# Patient Record
Sex: Female | Born: 1940 | Race: White | Hispanic: No | Smoking: Former smoker
Health system: Southern US, Community
[De-identification: ages and names within clinical notes are randomized; demographics above are authoritative.]

## PROBLEM LIST (undated history)

## (undated) DIAGNOSIS — E785 Hyperlipidemia, unspecified: Secondary | ICD-10-CM

## (undated) DIAGNOSIS — K469 Unspecified abdominal hernia without obstruction or gangrene: Secondary | ICD-10-CM

## (undated) DIAGNOSIS — I480 Paroxysmal atrial fibrillation: Secondary | ICD-10-CM

## (undated) DIAGNOSIS — I6523 Occlusion and stenosis of bilateral carotid arteries: Secondary | ICD-10-CM

## (undated) DIAGNOSIS — Z860100 Personal history of colon polyps, unspecified: Secondary | ICD-10-CM

## (undated) DIAGNOSIS — D039 Melanoma in situ, unspecified: Secondary | ICD-10-CM

## (undated) DIAGNOSIS — N281 Cyst of kidney, acquired: Secondary | ICD-10-CM

## (undated) DIAGNOSIS — H35033 Hypertensive retinopathy, bilateral: Secondary | ICD-10-CM

## (undated) DIAGNOSIS — I10 Essential (primary) hypertension: Secondary | ICD-10-CM

## (undated) DIAGNOSIS — K219 Gastro-esophageal reflux disease without esophagitis: Secondary | ICD-10-CM

## (undated) DIAGNOSIS — N6019 Diffuse cystic mastopathy of unspecified breast: Secondary | ICD-10-CM

## (undated) DIAGNOSIS — Z8619 Personal history of other infectious and parasitic diseases: Secondary | ICD-10-CM

## (undated) DIAGNOSIS — C4491 Basal cell carcinoma of skin, unspecified: Secondary | ICD-10-CM

## (undated) DIAGNOSIS — M1990 Unspecified osteoarthritis, unspecified site: Secondary | ICD-10-CM

## (undated) DIAGNOSIS — Z85820 Personal history of malignant melanoma of skin: Secondary | ICD-10-CM

## (undated) HISTORY — DX: Occlusion and stenosis of bilateral carotid arteries: I65.23

## (undated) HISTORY — DX: Cyst of kidney, acquired: N28.1

## (undated) HISTORY — PX: CARDIOVASCULAR STRESS TEST: SHX262

## (undated) HISTORY — DX: Hypertensive retinopathy, bilateral: H35.033

## (undated) HISTORY — DX: Personal history of other infectious and parasitic diseases: Z86.19

## (undated) HISTORY — DX: Essential (primary) hypertension: I10

## (undated) HISTORY — DX: Personal history of colon polyps, unspecified: Z86.0100

## (undated) HISTORY — DX: Melanoma in situ, unspecified: D03.9

## (undated) HISTORY — DX: Diffuse cystic mastopathy of unspecified breast: N60.19

## (undated) HISTORY — DX: Unspecified abdominal hernia without obstruction or gangrene: K46.9

## (undated) HISTORY — DX: Paroxysmal atrial fibrillation: I48.0

## (undated) HISTORY — DX: Personal history of malignant melanoma of skin: Z85.820

## (undated) HISTORY — DX: Basal cell carcinoma of skin, unspecified: C44.91

## (undated) HISTORY — DX: Gastro-esophageal reflux disease without esophagitis: K21.9

## (undated) HISTORY — DX: Hyperlipidemia, unspecified: E78.5

## (undated) HISTORY — DX: Unspecified osteoarthritis, unspecified site: M19.90

## (undated) HISTORY — PX: EXPLORATORY LAPAROTOMY: SUR591

## (undated) HISTORY — DX: Personal history of colonic polyps: Z86.010

---

## 1993-05-12 HISTORY — PX: APPENDECTOMY: SHX54

## 1995-05-13 HISTORY — PX: BREAST BIOPSY: SHX20

## 1996-05-12 HISTORY — PX: BREAST EXCISIONAL BIOPSY: SUR124

## 1996-05-12 HISTORY — PX: MELANOMA EXCISION: SHX5266

## 1997-02-09 HISTORY — PX: BREAST CYST ASPIRATION: SHX578

## 1998-05-12 LAB — HM DEXA SCAN: HM DEXA SCAN: NORMAL

## 2000-05-12 HISTORY — PX: CAROTID ENDARTERECTOMY: SUR193

## 2000-05-12 HISTORY — PX: CAROTID STENT: SHX1301

## 2003-05-13 DIAGNOSIS — K469 Unspecified abdominal hernia without obstruction or gangrene: Secondary | ICD-10-CM

## 2003-05-13 HISTORY — PX: TOTAL HIP ARTHROPLASTY: SHX124

## 2003-05-13 HISTORY — DX: Unspecified abdominal hernia without obstruction or gangrene: K46.9

## 2003-05-13 HISTORY — PX: HERNIA REPAIR: SHX51

## 2004-01-11 ENCOUNTER — Other Ambulatory Visit: Payer: Self-pay

## 2004-02-26 ENCOUNTER — Ambulatory Visit: Payer: Self-pay | Admitting: General Surgery

## 2004-08-20 ENCOUNTER — Ambulatory Visit: Payer: Self-pay | Admitting: Physician Assistant

## 2004-09-10 ENCOUNTER — Ambulatory Visit: Payer: Self-pay | Admitting: Pain Medicine

## 2004-09-26 ENCOUNTER — Ambulatory Visit: Payer: Self-pay | Admitting: Physician Assistant

## 2004-10-10 ENCOUNTER — Ambulatory Visit: Payer: Self-pay | Admitting: Pain Medicine

## 2004-10-22 ENCOUNTER — Ambulatory Visit: Payer: Self-pay | Admitting: Pain Medicine

## 2004-11-05 ENCOUNTER — Ambulatory Visit: Payer: Self-pay | Admitting: Physician Assistant

## 2005-01-30 ENCOUNTER — Ambulatory Visit: Payer: Self-pay | Admitting: Pain Medicine

## 2005-02-12 ENCOUNTER — Ambulatory Visit: Payer: Self-pay | Admitting: Pain Medicine

## 2005-02-14 ENCOUNTER — Ambulatory Visit: Payer: Self-pay | Admitting: Physician Assistant

## 2005-02-25 ENCOUNTER — Ambulatory Visit: Payer: Self-pay | Admitting: Pain Medicine

## 2005-03-11 ENCOUNTER — Ambulatory Visit: Payer: Self-pay | Admitting: General Surgery

## 2005-03-13 ENCOUNTER — Ambulatory Visit: Payer: Self-pay | Admitting: Pain Medicine

## 2005-03-26 ENCOUNTER — Ambulatory Visit: Payer: Self-pay | Admitting: Physician Assistant

## 2005-05-12 HISTORY — PX: OTHER SURGICAL HISTORY: SHX169

## 2006-03-12 ENCOUNTER — Ambulatory Visit: Payer: Self-pay | Admitting: General Surgery

## 2006-10-16 ENCOUNTER — Encounter: Admission: RE | Admit: 2006-10-16 | Discharge: 2006-10-16 | Payer: Self-pay | Admitting: Orthopedic Surgery

## 2006-12-08 ENCOUNTER — Inpatient Hospital Stay (HOSPITAL_COMMUNITY): Admission: RE | Admit: 2006-12-08 | Discharge: 2006-12-11 | Payer: Self-pay | Admitting: Orthopedic Surgery

## 2007-03-16 ENCOUNTER — Ambulatory Visit: Payer: Self-pay | Admitting: General Surgery

## 2007-06-02 ENCOUNTER — Encounter: Admission: RE | Admit: 2007-06-02 | Discharge: 2007-06-02 | Payer: Self-pay | Admitting: Orthopedic Surgery

## 2007-08-30 ENCOUNTER — Inpatient Hospital Stay (HOSPITAL_COMMUNITY): Admission: RE | Admit: 2007-08-30 | Discharge: 2007-09-02 | Payer: Self-pay | Admitting: Orthopedic Surgery

## 2008-03-16 ENCOUNTER — Ambulatory Visit: Payer: Self-pay | Admitting: General Surgery

## 2009-03-19 ENCOUNTER — Ambulatory Visit: Payer: Self-pay | Admitting: General Surgery

## 2010-03-20 ENCOUNTER — Ambulatory Visit: Payer: Self-pay | Admitting: General Surgery

## 2010-09-24 NOTE — Op Note (Signed)
NAMEAMYA, HLAD                ACCOUNT NO.:  [id number]   MEDICAL RECORD NO.:  [id number]          PATIENT TYPE:  INP   LOCATION:  0011                         FACILITY:  [facility]   PHYSICIAN:  Ollen Gross, M.D.    DATE OF BIRTH:  1940-06-12   DATE OF PROCEDURE:  12/08/2006  DATE OF DISCHARGE:                               OPERATIVE REPORT   PREOPERATIVE DIAGNOSIS:  Osteoarthritis left hip.   POSTOPERATIVE DIAGNOSIS:  Osteoarthritis left hip.   PROCEDURE:  Less total hip arthroplasty.   SURGEON:  Ollen Gross, M.D.   ASSISTANT:  Avel Peace, PA-C   ANESTHESIA:  General.   ESTIMATED BLOOD LOSS:  200 mL.   DRAIN:  None.   COMPLICATIONS:  None.   CONDITION:  Stable to recovery room.   BRIEF CLINICAL NOTE:  [name] is a 70 year old female end-stage  arthritis of left hip with progressively worsening pain and dysfunction.  She has failed nonoperative management and presents for left total hip  arthroplasty.   PROCEDURE IN DETAIL:  The successful administration of general  anesthetic, the patient was placed the right lateral decubitus position  with the left side up and held with the hip positioner.  Left lower  extremity isolated from the perineum with plastic drapes and prepped and  draped in usual sterile fashion.  Short posterolateral incision was made  with a 10 blade through subcutaneous tissue to the level of fascia lata  was incised in line with the skin incision.  Sciatic nerve is palpated  and protected and the short rotators isolated off the femur.  Capsulectomy is performed and the hip was dislocated.  Center of femoral  head is marked and the trial prosthesis placed such that the center of  the trial head corresponds to center of his native femoral head.  Osteotomy lines marked on the femoral neck and osteotomy made with an  oscillating saw.  Femoral head is removed and the femur retracted  anteriorly to gain acetabular exposure.   Acetabular  retractors were placed and the labrum and osteophytes  removed.  She had a large rim of circumferential osteophyte which I took  out. Reaming starts at 43 mm coursing increments of 2 up to 49 mm and  then a 50 mm Pinnacle acetabular shell was placed in anatomic position  and transfixed with two dome screws.  The apex hole eliminator is placed  and a 36 mm neutral Ultamet metal shell and liner was placed.   The femur was prepared with the canal finder and irrigation.  Axial  reaming is performed to 13.5 mm, proximal reaming to 18B and the sleeve  machined to a small.  18B small trial sleeve is placed on 18 x 13 stem  and 36 plus 8 neck matching native anteversion.  A 36 plus 0 head is  placed and the hip is reduced with outstanding stability.  There is full  extension, full external rotation, 70 degrees flexion, 40 degrees  abduction and 90 degrees internal rotation and 90 degrees of flexion and  70 degrees of internal rotation.  By placing the  left leg on top of the  right I felt as though leg lengths were equal.   The hip is dislocated and all trials are removed.  The permanent 18B  small sleeve is placed with 18 x 13 stem and 36 plus 8 neck matching  native anteversion.  A 36 plus 0 head is placed and the hip is reduced  with same stability parameters.  Wound is copiously irrigated with  saline solution and the short rotators reattached to the femur through  drill holes.  Fascia lata was closed with interrupted #1 Vicryl.  Subcu  closed #1-0 and #2-0 Vicryl and subcuticular running 4-0 Monocryl.  Incisions cleaned and dried and Steri-Strips and bulky sterile dressing  applied.  She is placed into a knee immobilizer, awakened and  transferred to recovery in stable condition.      Ollen Gross, M.D.  Electronically Signed     FA/MEDQ  D:  12/08/2006  T:  12/09/2006  Job:  [id number]

## 2010-09-24 NOTE — H&P (Signed)
[name], [name]                ACCOUNT NO.:  [id number]   MEDICAL RECORD NO.:  [id number]          PATIENT TYPE:  INP   LOCATION:  1613                         FACILITY:  [facility]   PHYSICIAN:  Ollen Gross, M.D.    DATE OF BIRTH:  1940-07-02   DATE OF ADMISSION:  12/08/2006  DATE OF DISCHARGE:                              HISTORY & PHYSICAL   DATE OF OFFICE VISIT HISTORY AND PHYSICAL:  November 27, 2006.   CHIEF COMPLAINT:  Left hip pain.   HISTORY OF PRESENT ILLNESS:  This is a 70 year old female who is seen by  Dr. Lequita Halt for ongoing left hip pain that has been increasing over the  past several months, been giving her problems for quite some time now.  Pain has gotten worse to the point where it is hurting all the time.  She is seen in the office, where x-rays show severe end-stage arthritis  of both hips, bone-on-bone, the left is a little worse than the right.  It is felt she has reached the point where she would benefit from  undergoing surgical intervention.  Risks and benefits discussed, the  patient has accepted and is admitted to the hospital.   ALLERGIES:  No known drug allergies.  Intolerances:  Erythromycin.   CURRENT MEDICATIONS:  Plavix, enalapril, Celebrex, Toprol, Lipitor,  hydrochlorothiazide, vitamin C, multivitamins, Os-Cal Plus D, Tylenol  Extra Strength, Tylenol Arthritis, and Claritin.   PAST MEDICAL HISTORY:  1. Tic douloureux.  2. 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  3. History of depression.  4. Migraines.  5. Irritable bowel syndrome.  6. Seasonal allergies.  7. Left hip dysplasia.  8. Right carotid disease.   PAST SURGICAL HISTORY:  1. Right carotid stenting.  2. Tonsillectomy.  3. Surgery x2 for tic douloureux.   SOCIAL HISTORY:  Nonsmoker, one glass of wine.  Two children.  Married.  Husband will be assisting with care after surgery.   FAMILY HISTORY:  Father deceased at age 64 secondary to an MI.  Mother,  age 27, relative good  health.   REVIEW OF SYSTEMS:  GENERAL:  No fevers, chills, night sweats.  NEURO:  No seizures, syncope or paralysis.  RESPIRATORY:  No shortness of  breath, productive cough or hemoptysis.  CARDIOVASCULAR: No chest pain,  angina or orthopnea.  GI: No nausea, vomiting, diarrhea or constipation.  GU: No dysuria, hematuria or discharge.  MUSCULOSKELETAL:  Left hip.   PHYSICAL EXAM:  VITAL SIGNS:  Pulse 56, respirations 12, blood pressure  118/62.  GENERAL:  A 70 year old white female, well-nourished, well-developed,  short stature, no acute distress.  She is alert and oriented and  cooperative, excellent historian, accompanied by her husband.  HEENT: Normocephalic, atraumatic.  Pupils are equal, round and reactive.  Oropharynx clear.  EOMs intact.  NECK:  Supple.  Bilateral carotid scars noted.  She does have bilateral  bruits, trace to +1.  CHEST:  Clear anterior, posterior chest walls.  No rhonchi, rales, or  wheezing.  HEART:  Regular rate and rhythm.  No murmur, S1-S2 noted.  ABDOMEN:  Soft, nontender.  Bowel  sounds present.  RECTAL, BREASTS, GENITALIA:  Not done, not pertinent to present illness.  EXTREMITIES:  Left hip:  Left hip flexion 90-0, internal rotation 0,  external rotation 0 degrees of abduction.  Pulses noted to be intact.   IMPRESSION:  Osteoarthritis, left hip.   PLAN:  The patient will be admitted to [facility] to undergo a  left total hip replacement arthroplasty.  Surgery will be performed by  Dr. Ollen Gross.      Alexzandrew L. Perkins, P.A.C.      Ollen Gross, M.D.  Electronically Signed    ALP/MEDQ  D:  12/09/2006  T:  12/10/2006  Job:  [id number]   cc:   Lorie Phenix  Fax: [phone]

## 2010-09-24 NOTE — Op Note (Signed)
NAMEKHRISTA, [name]                ACCOUNT NO.:  [id number]   MEDICAL RECORD NO.:  [id number]          PATIENT TYPE:  INP   LOCATION:  0004                         FACILITY:  [facility]   PHYSICIAN:  Ollen Gross, M.D.    DATE OF BIRTH:  Dec 19, 1940   DATE OF PROCEDURE:  08/30/2007  DATE OF DISCHARGE:                               OPERATIVE REPORT   PREOPERATIVE DIAGNOSIS:  Osteoarthritis, right hip.   POSTOPERATIVE DIAGNOSIS:  Osteoarthritis, right hip.   PROCEDURE:  Right total hip arthroplasty.   SURGEON:  Ollen Gross, M.D.   ASSISTANT:  Avel Peace, PA-C   ANESTHESIA:  General.   ESTIMATED BLOOD LOSS:  300.   DRAIN:  Hemovac x1.   COMPLICATIONS:  None.   CONDITION:  Stable, to Recovery.   BRIEF CLINICAL NOTE:  [name] is a 70 year old female with end-stage  arthritis of the right hip and progressively worsening pain and  dysfunction.  She has had a recent successful left total hip  arthroplasty and presents now for right total hip arthroplasty.   PROCEDURE IN DETAIL:  After successful administration of general  anesthetic, the patient is placed in the left lateral decubitus position  with the right side up and held with the hip positioner.  The right  lower extremity is isolated from the perineum with plastic drapes and  prepped and draped in the usual sterile fashion.  A short posterolateral  incision is made with a 10 blade through subcutaneous tissue to the  level of fascia lata, which is incised in line with the skin incision.  The sciatic nerve is palpated and protected and the short external  rotators isolated off the femur.  Capsulectomy is performed and the hip  is dislocated.  Center of the femoral head is marked and a trial  prosthesis placed such that the center of the trial head corresponds to  center of her native femoral head.  Osteotomy lines are marked on the  femoral neck and osteotomy made with an oscillating saw.  Femoral head  is removed and  the femur retracted anteriorly to gain acetabular  exposure.   Acetabular retractors are placed and labrum and osteophytes removed.  Reaming starts at 43 mm, coursing in increments of 2-49 mm and then a 50-  mm Pinnacle acetabular shell is placed in anatomic position and  transfixed with 2 dome screws.  Apex hole eliminator is placed and 36-mm  neutral Ultamet metal liner is placed for a metal-on-metal hip  replacement.   Femur is prepared with the canal finder and irrigation.  Axial reaming  is performed up to 13.5 mm and proximal reaming to 18-D and the sleeve  machined to a small.  An 18-D small trial sleeve is placed with 18 x 13  stem and a 36 +8 neck, matching native anteversion.  A 36 +0 head is  placed and the hip is reduced with outstanding stability.  There is full  extension, full external rotation, 70 degrees of flexion, 40 degrees of  adduction, 90 degrees of internal rotation and 90 degrees of flexion and  90 degrees  of internal rotation.  By placing the right leg on top of the  left, it felt as though the leg lengths were equal.  The hip is  dislocated and trials removed.  Permanent 18-D small sleeve is placed  and 18 x 13 stem and 36 +8 neck, matching native anteversion.  The 36 +0  head is placed and the hip is reduce with the same stability parameters.  The wound is copiously irrigated with saline solution and the short  rotators reattached to the femur through drill holes.  Gross subcu is  closed with #1 and 2-0 Vicryl and subcuticular with running 4-0  Monocryl.  The incision is cleaned and dried and Steri-Strips and a  bulky sterile dressing applied.  A drain is hooked to suction and she is  placed into a knee immobilizer, awakened and transported to Recovery in  stable condition.      Ollen Gross, M.D.  Electronically Signed     FA/MEDQ  D:  08/30/2007  T:  08/30/2007  Job:  [id number]

## 2010-09-27 NOTE — Discharge Summary (Signed)
**Note Rebecca via Obfuscation** Mcgee, [name]                ACCOUNT NO.:  [id number]   MEDICAL RECORD NO.:  [id number]          PATIENT TYPE:  INP   LOCATION:  1613                         FACILITY:  [facility]   PHYSICIAN:  Ollen Gross, M.D.    DATE OF BIRTH:  08/24/1940   DATE OF ADMISSION:  12/08/2006  DATE OF DISCHARGE:  12/11/2006                               DISCHARGE SUMMARY   ADMITTING DIAGNOSES:  1. Osteoarthritis left hip.  2. Tic douloureux.  3. Decreased hearing right ear.  4. History of depression.  5. Migraines.  6. Irritable bowel syndrome.  7. Seasonal allergies.  8. Left hip dysplasia.  9. Right carotid disease.   DISCHARGE DIAGNOSES:  1. Osteoarthritis left hip status post left total hip replacement      arthroplasty.  2. Postop blood loss anemia.  Did not require transfusion.  3. Postop hyponatremia improved.  4. Decreased hearing right ear.  5. History of depression.  6. Migraines.  7. Irritable bowel syndrome.  8. Seasonal allergies.  9. Left hip dysplasia.  10.Right carotid disease.   PROCEDURE:  December 08, 2006 left total hip.  Surgeon Dr. Lequita Halt.  Assisting Alexzandrew Perkins P.A.C.   ANESTHESIA:  General.   CONSULTATIONS:  None.   BRIEF HISTORY:  The patient is a 70 year old female with end-stage  arthritis of left hip with progressive worsening pain and dysfunction  who failed operative management now presents for total hip arthroplasty.   EKG dated November 06, 2006:  Sinus rhythm, normal P axis, heart rate 84  confirmed.   LABORATORY DATA:  CBC on admission:  Hemoglobin 14.6, hematocrit 42.4,  white cell count 4.8, hemoglobin dropped down to 11.4 and drifted down  to 10.1, last H&H 9.7, 27.6.  PT/PTT preop 11.9 and 29, respectively.  INR 0.9.  Serial protimes  followed.  PT/INR 19.6 and 1.6.  Chem panel  on admission all within normal limits.  Serial BMET's were followed.  Sodium did drop from 135 to 131 and back to 135.  Preop UA:  Trace  leukocyte esterase, 0-2  white, 0-2 red; otherwise, negative.  Blood  group type A+.   X-RAYS:  Left hip films December 02, 2006:  Severe bilateral hip arthritis  left greater than right.  Two-view chest December 02, 2006:  No acute  findings.  Postop hip and pelvis films:  Left total hip without  complication.   HOSPITAL COURSE:  The patient admitted to [facility].  Tolerated the procedure well.  Later transferred from recovery room to  orthopedic floor.  Started on PCA and p.o. analgesic pain control  following surgery.  Actually doing pretty well on the morning of day #1.  Sitting up in bed.  I could already tell a big difference in her pain  from preop.  Started on PCA and p.o. analgesics.  Given 24 hours postop  IV antibiotics.  Started on Coumadin for DVT prophylaxis.  We decreased  her fluids.  Started getting her up out of bed on day #1.  By day #2 she  was doing very well.  Already ambulating about 60-70  feet.  Dressing was  changed, and incision looked good.  Hemoglobin was 10 without symptoms.  Sodium was a little low on day #1, but came back up on day #2 after we  discontinued the fluids.  Continued to progress well and by day #3 she  was doing well, ambulating 60-70 feet.  Hemoglobin was 9.7.  She was  asymptomatic with this.  She is placed on iron.  Progressing and meeting  her goals and was discharged home.   DISCHARGE/PLAN:  1. The patient discharged home on December 11, 2006.  2. Discharge Diagnoses, please see above.  3. Discharge medications:  Nu-Iron, Coumadin, Percocet, Robaxin.      Follow up two weeks.   ACTIVITY:  Partial weightbearing 25-50%.   DIET:  Resume home diet.   DISPOSITION:  Home.   CONDITION ON DISCHARGE:  Improved.      Alexzandrew L. Perkins, P.A.C.      Ollen Gross, M.D.  Electronically Signed    ALP/MEDQ  D:  01/14/2007  T:  01/14/2007  Job:  [id number]   cc:   Lorie Phenix  Fax: [phone]

## 2010-09-27 NOTE — H&P (Signed)
[name], [name]                ACCOUNT NO.:  [id number]   MEDICAL RECORD NO.:  [id number]          PATIENT TYPE:  INP   LOCATION:  1610                         FACILITY:  [facility]   PHYSICIAN:  Ollen Gross, M.D.    DATE OF BIRTH:  [birth year]   DATE OF ADMISSION:  08/30/2007  DATE OF DISCHARGE:                              HISTORY & PHYSICAL   CHIEF COMPLAINT:  Right hip pain.   HISTORY OF PRESENT ILLNESS:  The patient is a 70 year old female well-  known to Dr. Ollen Gross who had previously undergone a left total hip  back in July of 2008.  Was doing well with that.  She continues to have  right hip pain.  Has known arthritis and felt to be a good candidate.  She has been seen preoperatively by Dr. Elease Hashimoto and also by Dr. Marisue Brooklyn and cleared for surgery.   ALLERGIES:  NO KNOWN DRUG ALLERGIES.   CURRENT MEDICATIONS:  Enalapril, Lipitor, Plavix, Celebrex,  hydrochlorothiazide, Toprol-XL, Claritin, multivitamin, Os-Cal plus D,  vitamin C, Extra-strength Tylenol.   PAST MEDICAL HISTORY:  1. Tic douloureux.  2. History of melanoma.  3. History of migraines.  4. History of depression.  5. Decreased hearing, right ear.  6. Hypertension.  7. Right carotid arterial disease.  8. Chronic incomplete right bundle branch block.  9. IBS.  10.Hemorrhoids.  11.Postmenopausal.   SOCIAL HISTORY:  Married, nonsmoker, occasional glass of wine.  Two  children.  Husband will be assisting with care after surgery.   FAMILY HISTORY:  Hypertension, arthritis.   REVIEW OF SYSTEMS:  GENERAL:  No fevers, chills, night sweats.  NEURO:  No seizures, syncope, or paralysis.  RESPIRATORY:  No shortness breath,  productive cough, or hemoptysis.  CV:  No chest pain, angina, or  orthopnea.  GI:  No nausea, vomiting, diarrhea, or constipation.  Does  have IBS and hemorrhoids.  GU:  No dysuria, hematuria, or discharge.  MUSCULOSKELETAL:  Right hip.   PHYSICAL EXAMINATION:  VITAL SIGNS:  Pulse  60, respirations 12, blood  pressure 112/60.  GENERAL:  A 70 year old female, well-nourished, well-developed, no acute  distress.  She is alert, oriented, cooperative, pleasant.  Good  historian.  HEENT:  Normocephalic, atraumatic.  Pupils round and reactive.  Oropharynx clear.  EOMs intact.  NECK:  Supple with a trace right bruit.  CHEST:  Clear.  HEART:  Regular rate and rhythm with faint systolic ejection murmur best  heard over aortic point.  S1-S2 noted.  ABDOMEN:  Soft, nontender.  Bowel sounds present.  RECTAL/BREASTS, GENITALIA:  Not done, not pertinent to present illness.  EXTREMITIES:  Right hip, flexion 90-0, internal rotation 0, external  rotation 10-15 degrees abduction.   IMPRESSION:  Osteoarthritis right hip.   PLAN:  The patient admitted to [facility] undergo a right  total hip arthroplasty.  Surgery will be performed by Ollen Gross.      Alexzandrew L. Perkins, P.A.C.      Ollen Gross, M.D.  Electronically Signed    ALP/MEDQ  D:  09/12/2007  T:  09/12/2007  Job:  [id number]   cc:   [name]  Fax: [phone]   Lovenia Kim, D.O.  Fax: [phone]   Ollen Gross, M.D.  Fax: [phone]

## 2010-09-27 NOTE — Discharge Summary (Signed)
[name], [name]                ACCOUNT NO.:  [id number]   MEDICAL RECORD NO.:  [id number]          PATIENT TYPE:  INP   LOCATION:  1610                         FACILITY:  [facility]   PHYSICIAN:  Ollen Gross, M.D.    DATE OF BIRTH:  January 11, 1941   DATE OF ADMISSION:  08/30/2007  DATE OF DISCHARGE:  09/02/2007                               DISCHARGE SUMMARY   ADMITTING DIAGNOSIS:  1. Osteoarthritis, right hip.  2. Tic douloureux.  3. History of melanoma.  4. History of migraines.  5. History of depression.  6. Decreased hearing, right ear.  7. Hypertension.  8. Right carotid arterial disease.  9. Chronic incomplete right bundle branch block.  10.Irritable bowel syndrome.  11.Hemorrhoids.  12.Postmenopausal.   DISCHARGE DIAGNOSES:  1. Osteoarthritis, right hip, status post right total hip      arthroplasty.  2. Postoperative blood loss anemia.  Did not require transfusion.  3. Postoperative hyponatremia, improved.  4. Tic douloureux.  5. History of melanoma.  6. History of migraines.  7. History of depression.  8. Decreased hearing, right ear.  9. Hypertension.  10.Right carotid arterial disease.  11.Chronic incomplete right bundle branch block.  12.Irritable bowel syndrome.  13.Hemorrhoids.  14.Postmenopausal.   PROCEDURE:  On August 30, 2007, right total hip.  Surgeon:  Ollen Gross, M.D.  Assistant:  Alexzandrew L. Perkins, P.A.C.  Anesthesia:  General.   CONSULTATIONS:  None.   BRIEF HISTORY:  Ms. Base is a 70 year old female with end-stage  osteoarthritis of the right hip, progressively worsening pain and  dysfunction.  She has had a recent successful left total hip, failed  nonoperative management, now presents for right total hip.   LABORATORY DATA:  Preoperative CBC showed hemoglobin of 15.5, hematocrit  of 45.6, white cell count 5.8.  Postoperative hemoglobin 9.9, drifted  down to 9.2.  Rechecked, H and H came back to 9.6 and 27.5.  PT and PTT  12.3 and  31, respectively, INR 0.9.  Serial pro-times followed, last  PT/INR 20.5 and 1.7.  Chem panel on admission all within normal limits.  Serial BMETs were followed; sodium dropped from 138 to 131, back up to  136.  Glucose went from 90 to 134, back down to 108.  Preoperative UA  negative.  Blood type A-positive.   X-RAYS:  Right hip film preoperatively, August 25, 2007:  Advanced  arthritis, right hip.  Two-view chest, August 25, 2007:  No active disease.   EKG, dated July 07, 2007:  Sinus rhythm, normal P axis, rate 83,  borderline.   HOSPITAL COURSE:  The patient was admitted to [facility],  tolerated the procedure well, transferred to recovery room, orthopedic  floor, started on PCA and p.o. analgesics and pain control following  surgery.  Doing pretty well the morning of day #1.  Started getting out  of bed.  The PCA was discontinued later that day.  Pressure was a little  on the lower side, so we held her enalapril with parameters.  Sodium was  a little low also.  This was felt to be due  to dilutional components, we  decreased the fluids.  She had excellent urinary output.  Hemovac drain  placed at time of surgery was pulled.  She started getting up out of  bed.  She initially walked about 20 feet on day #1.  By day #2, she was  doing a little bit better, ambulating well, walking 60 feet.  Dressing  changed.  Incision looked good.  Hemoglobin down to 9.2.  She was on  iron supplementation.  She was asymptomatic.  Hypotension with systolics  about 94.  She was asymptomatic with this with her therapy.  Incision  looked good, healing well, walked about 100 feet on the following day,  postoperative day #3.  Pressure was back up to 114 systolic.  Tolerating  her medicine.  Was discharged home.   DISCHARGE PLAN:  Patient discharged home on September 02, 2007.   DISCHARGE DIAGNOSES:  Please see above.   DISCHARGE MEDICATIONS:  Percocet, Robaxin, __________ and Coumadin.    DIET:  Low-sodium heart-healthy diet.   ACTIVITY:  Partial weightbearing, 25%, right leg.  Hip precautions,  total hip protocol.   FOLLOWUP:  Two weeks.   DISPOSITION:  Home.   CONDITION ON DISCHARGE:  Improving.      Alexzandrew L. Perkins, P.A.C.      Ollen Gross, M.D.  Electronically Signed    ALP/MEDQ  D:  10/13/2007  T:  10/13/2007  Job:  [id number]   cc:   Lorie Phenix  Fax: [phone]   Lovenia Kim, D.O.  Fax: [phone]

## 2011-02-04 LAB — BASIC METABOLIC PANEL
BUN: 8
BUN: 8
CO2: 26
CO2: 31
Calcium: 8.1 — ABNORMAL LOW
Chloride: 100
Chloride: 102
Creatinine, Ser: 0.77
GFR calc non Af Amer: >60
Glucose, Bld: 108 — ABNORMAL HIGH
Glucose, Bld: 134 — ABNORMAL HIGH
Potassium: 4.2
Potassium: 4.3
Sodium: 131 — ABNORMAL LOW
Sodium: 136

## 2011-02-04 LAB — CBC
HCT: 27.3 — ABNORMAL LOW
HCT: 27.5 — ABNORMAL LOW
HCT: 28.5 — ABNORMAL LOW
HCT: 45.6
Hemoglobin: 15.5 — ABNORMAL HIGH
Hemoglobin: 9.2 — ABNORMAL LOW
Hemoglobin: 9.6 — ABNORMAL LOW
Hemoglobin: 9.9 — ABNORMAL LOW
MCHC: 33.6
MCHC: 34.1
MCHC: 34.5
MCV: 94.9
MCV: 95.1
MCV: 95.3
MCV: 96.6
Platelets: 196
Platelets: 196
Platelets: 250
RBC: 2.83 — ABNORMAL LOW
RBC: 3.01 — ABNORMAL LOW
RBC: 4.79
RDW: 13
RDW: 13
RDW: 13.3
RDW: 13.3
WBC: 5.8
WBC: 7.8

## 2011-02-04 LAB — TYPE AND SCREEN
ABO/RH(D): A POS
Antibody Screen: NEGATIVE

## 2011-02-04 LAB — URINALYSIS, ROUTINE W REFLEX MICROSCOPIC
Bilirubin Urine: NEGATIVE
Glucose, UA: NEGATIVE
Hgb urine dipstick: NEGATIVE
Ketones, ur: NEGATIVE
Nitrite: NEGATIVE
Protein, ur: NEGATIVE
Specific Gravity, Urine: 1.019
Urobilinogen, UA: 0.2
pH: 7.5

## 2011-02-04 LAB — COMPREHENSIVE METABOLIC PANEL
AST: 20
Albumin: 4.9
Alkaline Phosphatase: 88
BUN: 15
Chloride: 97
Potassium: 4.8
Sodium: 138
Total Bilirubin: 1
Total Protein: 8.3

## 2011-02-04 LAB — PROTIME-INR
INR: 0.9
Prothrombin Time: 12.3

## 2011-02-04 LAB — APTT: aPTT: 31

## 2011-02-24 LAB — PROTIME-INR
Prothrombin Time: 19.6 — ABNORMAL HIGH
Prothrombin Time: 11.9

## 2011-02-24 LAB — CBC
HCT: 27.6 — ABNORMAL LOW
HCT: 28.6 — ABNORMAL LOW
HCT: 32.4 — ABNORMAL LOW
Hemoglobin: 10.1 — ABNORMAL LOW
Hemoglobin: 11.4 — ABNORMAL LOW
Hemoglobin: 14.6
MCHC: 35.4
MCHC: 35.3
MCV: 98.4
MCV: 99.3
Platelets: 212
RBC: 2.8 — ABNORMAL LOW
RBC: 2.88 — ABNORMAL LOW
RBC: 3.29 — ABNORMAL LOW
RBC: 4.32
RDW: 13.6
WBC: 7.4

## 2011-02-24 LAB — COMPREHENSIVE METABOLIC PANEL
ALT: 17
Alkaline Phosphatase: 61
CO2: 29
Chloride: 96
GFR calc non Af Amer: >60
Glucose, Bld: 107 — ABNORMAL HIGH
Potassium: 4.3
Sodium: 135

## 2011-02-24 LAB — BASIC METABOLIC PANEL
CO2: 29
Chloride: 99
Creatinine, Ser: 0.66
GFR calc Af Amer: >60
GFR calc Af Amer: >60
GFR calc non Af Amer: >60
Glucose, Bld: 155 — ABNORMAL HIGH
Potassium: 4.3
Potassium: 4.4
Sodium: 131 — ABNORMAL LOW
Sodium: 135

## 2011-02-24 LAB — CROSSMATCH

## 2011-02-24 LAB — URINALYSIS, ROUTINE W REFLEX MICROSCOPIC
Bilirubin Urine: NEGATIVE
Hgb urine dipstick: NEGATIVE
Specific Gravity, Urine: 1.02
Urobilinogen, UA: 0.2
pH: 7

## 2011-02-24 LAB — URINE MICROSCOPIC-ADD ON

## 2011-03-28 LAB — COMPREHENSIVE METABOLIC PANEL
AST: 15 U/L
Alkaline Phosphatase: 98 U/L
BUN: 19 mg/dL (ref 4–21)
Creat: 1.09
Sodium: 140 mmol/L (ref 137–147)
Total Bilirubin: 0.5 mg/dL

## 2011-04-23 ENCOUNTER — Ambulatory Visit: Payer: Self-pay | Admitting: General Surgery

## 2011-05-16 ENCOUNTER — Ambulatory Visit: Payer: Self-pay | Admitting: General Surgery

## 2011-05-27 ENCOUNTER — Ambulatory Visit: Payer: Self-pay | Admitting: General Surgery

## 2011-06-26 ENCOUNTER — Ambulatory Visit: Payer: Self-pay | Admitting: General Surgery

## 2011-06-26 LAB — POTASSIUM: Potassium: 3.9 mmol/L (ref 3.5–5.1)

## 2011-07-01 ENCOUNTER — Ambulatory Visit: Payer: Self-pay | Admitting: General Surgery

## 2012-03-03 ENCOUNTER — Ambulatory Visit: Payer: Self-pay | Admitting: General Surgery

## 2012-03-12 HISTORY — PX: COLONOSCOPY: SHX174

## 2012-04-23 ENCOUNTER — Ambulatory Visit: Payer: Self-pay | Admitting: General Surgery

## 2012-05-12 HISTORY — PX: CAROTID STENT: SHX1301

## 2012-05-14 ENCOUNTER — Encounter: Payer: Self-pay | Admitting: Family Medicine

## 2012-06-15 ENCOUNTER — Ambulatory Visit (INDEPENDENT_AMBULATORY_CARE_PROVIDER_SITE_OTHER): Payer: Medicare Other | Admitting: Family Medicine

## 2012-06-15 ENCOUNTER — Encounter: Payer: Self-pay | Admitting: Family Medicine

## 2012-06-15 VITALS — BP 108/64 | HR 68 | Temp 98.2°F | Ht 62.5 in | Wt 159.8 lb

## 2012-06-15 DIAGNOSIS — I10 Essential (primary) hypertension: Secondary | ICD-10-CM | POA: Insufficient documentation

## 2012-06-15 DIAGNOSIS — I658 Occlusion and stenosis of other precerebral arteries: Secondary | ICD-10-CM

## 2012-06-15 DIAGNOSIS — M1990 Unspecified osteoarthritis, unspecified site: Secondary | ICD-10-CM

## 2012-06-15 DIAGNOSIS — I6523 Occlusion and stenosis of bilateral carotid arteries: Secondary | ICD-10-CM

## 2012-06-15 DIAGNOSIS — E785 Hyperlipidemia, unspecified: Secondary | ICD-10-CM

## 2012-06-15 DIAGNOSIS — M129 Arthropathy, unspecified: Secondary | ICD-10-CM

## 2012-06-15 NOTE — Assessment & Plan Note (Signed)
Chronic, stable. Compliant and tolerant of med - continue. Check FLP next blood work.

## 2012-06-15 NOTE — Assessment & Plan Note (Signed)
S/p R CEA by Dr. Jodie Echevaria at [facility]. To schedule f/u with him.

## 2012-06-15 NOTE — Patient Instructions (Addendum)
Check with Dr. Evette Cristal about latest tetanus shot. Good to meet you today, call us with questions We will obtain records of recent blood work and from Dr. Elease Hashimoto. Return at your convenience fasting for blood work, afterwards for [employer] visit.

## 2012-06-15 NOTE — Progress Notes (Signed)
Subjective:    Patient ID: [name], female    DOB: [birth year], 72 y.o.   MRN: [id number]  HPI CC: new pt to establish  Prior saw Dr. Elease Hashimoto in [place].  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 Roseanne Kaufman at [facility])  HTN - longstanding hx.  Tolerating bp meds well.  See ROS. HLD - no myalgias.  Tolerating lipitor well. Carotid stenosis - s/p R CEA, doing well  Preventative: Thinks last medicare wellness visit 1 yr ago Colonoscopy 03/2012 (Sankar) Pneumovax - 7-8 yrs ago.  Completed Mammogram 04/2012 normal Well woman with OBGYN Dr. Luella Cook (01/2012) Flu - 2013 Shingles 2013 Tetanus - unsure  Caffeine: 3 cups coffee/day Lives with husband, 1 dog.  Grown children Occupation: homemaker Edu: college Activity: plays golf, walks dog, swims Diet: good water, fruits/vegetables daily  Medications and allergies reviewed and updated in chart.  Past histories reviewed and updated if relevant as below. There is no problem list on file for this patient.  Past Medical History  Diagnosis Date  . HTN (hypertension) 1970s  . HLD (hyperlipidemia)   . History of colon polyps   . Bilateral carotid artery stenosis     R CEA, L monitoring ([facility], Dr. Jodie Echevaria)  . Fibrocystic breast disease   . Arthritis     hands, knees, ankles  . History of melanoma 1990s    right shoulder  . History of chicken pox    Past Surgical History  Procedure Date  . Colonoscopy 02/2012    Sankar - polyp (negative per pt), rec rpt 5 yrs  . Breast biopsy 1998    left  . Appendectomy 1995  . Total hip arthroplasty 2005    bilat  . Carotid stent 2002    right  . Cesarean section 1968; 1972  . Melanoma excision 1998    right shoulder  . Exploratory laparotomy 1990s    for GI pain, inflamed lower GI, unrevealing workup, improved with abx   History  Substance Use Topics  . Smoking status: Former Smoker -- 1.0 packs/day for 30 years  . Smokeless tobacco:  Never Used  . Alcohol Use: No     Comment: 1 glass wine daily   Family History  Problem Relation Age of Onset  . Cancer Father 55    prostate  . Cancer Mother 40    lung, smoker  . Hypertension Paternal Grandfather   . CAD Neg Hx   . Stroke Neg Hx   . Diabetes Neg Hx    No Known Allergies Current Outpatient Prescriptions on File Prior to Visit  Medication Sig Dispense Refill  . atorvastatin (LIPITOR) 40 MG tablet Take 40 mg by mouth at bedtime.       . calcium-vitamin D (OSCAL WITH D) 500-200 MG-UNIT per tablet Take 1 tablet by mouth daily.      . cetirizine (ZYRTEC) 10 MG tablet Take 10 mg by mouth daily.      . enalapril (VASOTEC) 20 MG tablet Take 20 mg by mouth 2 (two) times daily.      . hydrochlorothiazide (MICROZIDE) 12.5 MG capsule Take 12.5 mg by mouth daily.      . metoprolol succinate (TOPROL-XL) 50 MG 24 hr tablet Take 50 mg by mouth daily. Take with or immediately following a meal.      . ranitidine (ZANTAC) 150 MG tablet Take 150 mg by mouth daily.         Review of Systems  Constitutional: Negative for fever, chills, activity change, appetite change, fatigue and unexpected weight change.  HENT: Negative for hearing loss and neck pain.   Eyes: Negative for visual disturbance.  Respiratory: Negative for cough, chest tightness, shortness of breath and wheezing.   Cardiovascular: Negative for chest pain, palpitations and leg swelling.  Gastrointestinal: Negative for nausea, vomiting, abdominal pain, diarrhea, constipation, blood in stool and abdominal distention.  Genitourinary: Negative for hematuria and difficulty urinating.  Musculoskeletal: Negative for myalgias and arthralgias.  Skin: Negative for rash.  Neurological: Negative for dizziness, seizures, syncope and headaches.  Hematological: Bruises/bleeds easily (from plavix).  Psychiatric/Behavioral: Negative for dysphoric mood. The patient is not nervous/anxious.        Objective:   Physical Exam  Nursing  note and vitals reviewed. Constitutional: She is oriented to person, place, and time. She appears well-developed and well-nourished. No distress.  HENT:  Head: Normocephalic and atraumatic.  Right Ear: Hearing, tympanic membrane, external ear and ear canal normal.  Left Ear: Hearing, tympanic membrane, external ear and ear canal normal.  Nose: Nose normal.  Mouth/Throat: Oropharynx is clear and moist. No oropharyngeal exudate.  Eyes: Conjunctivae normal and EOM are normal. Pupils are equal, round, and reactive to light. No scleral icterus.  Neck: Normal range of motion. Neck supple. Carotid bruit is present (bilateral). No thyromegaly present.  Cardiovascular: Normal rate, regular rhythm, normal heart sounds and intact distal pulses.   No murmur heard. Pulses:      Radial pulses are 2+ on the right side, and 2+ on the left side.  Pulmonary/Chest: Effort normal and breath sounds normal. No respiratory distress. She has no wheezes. She has no rales.  Abdominal: Soft. Bowel sounds are normal. She exhibits no distension and no mass. There is no tenderness. There is no rebound and no guarding.  Musculoskeletal: Normal range of motion. She exhibits no edema.  Lymphadenopathy:    She has no cervical adenopathy.  Neurological: She is alert and oriented to person, place, and time.       CN grossly intact, station and gait intact  Skin: Skin is warm and dry. No rash noted.  Psychiatric: She has a normal mood and affect. Her behavior is normal. Judgment and thought content normal.       Assessment & Plan:

## 2012-06-15 NOTE — Assessment & Plan Note (Signed)
Takes celebrex for this.

## 2012-06-15 NOTE — Assessment & Plan Note (Signed)
Chronic, stable. Compliant with meds - continue.

## 2012-06-16 ENCOUNTER — Encounter: Payer: Self-pay | Admitting: Family Medicine

## 2012-06-17 ENCOUNTER — Encounter: Payer: Self-pay | Admitting: Family Medicine

## 2012-06-17 ENCOUNTER — Telehealth: Payer: Self-pay

## 2012-06-17 NOTE — Telephone Encounter (Signed)
Pt was seen 06/15/12 and was to ck on status of tetanus immunization. Pt thinks it has been more than 10 years and pt would like to get tetanus immun on 10/13/12 when she is scheduled for CPX.  Pt only needs call back if Dr Reece Agar is not agreeable.

## 2012-06-17 NOTE — Telephone Encounter (Signed)
Noted. Thanks.

## 2012-09-06 LAB — HM PAP SMEAR: HM PAP: NORMAL

## 2012-09-30 ENCOUNTER — Other Ambulatory Visit: Payer: Self-pay | Admitting: Family Medicine

## 2012-09-30 DIAGNOSIS — D649 Anemia, unspecified: Secondary | ICD-10-CM

## 2012-09-30 DIAGNOSIS — E785 Hyperlipidemia, unspecified: Secondary | ICD-10-CM

## 2012-09-30 DIAGNOSIS — I10 Essential (primary) hypertension: Secondary | ICD-10-CM

## 2012-10-06 ENCOUNTER — Other Ambulatory Visit (INDEPENDENT_AMBULATORY_CARE_PROVIDER_SITE_OTHER): Payer: Medicare Other

## 2012-10-06 DIAGNOSIS — E785 Hyperlipidemia, unspecified: Secondary | ICD-10-CM

## 2012-10-06 DIAGNOSIS — D649 Anemia, unspecified: Secondary | ICD-10-CM

## 2012-10-06 DIAGNOSIS — I10 Essential (primary) hypertension: Secondary | ICD-10-CM

## 2012-10-06 LAB — COMPREHENSIVE METABOLIC PANEL
Albumin: 4.2 g/dL (ref 3.5–5.2)
Alkaline Phosphatase: 79 U/L (ref 39–117)
BUN: 21 mg/dL (ref 6–23)
Calcium: 9.8 mg/dL (ref 8.4–10.5)
Chloride: 102 mEq/L (ref 96–112)
Glucose, Bld: 108 mg/dL — ABNORMAL HIGH (ref 70–99)
Potassium: 4.2 mEq/L (ref 3.5–5.1)
Sodium: 138 mEq/L (ref 135–145)
Total Protein: 7.6 g/dL (ref 6.0–8.3)

## 2012-10-06 LAB — CBC WITH DIFFERENTIAL/PLATELET
Basophils Absolute: 0.1 10*3/uL (ref 0.0–0.1)
Eosinophils Absolute: 0.2 10*3/uL (ref 0.0–0.7)
HCT: 45.2 % (ref 36.0–46.0)
Lymphs Abs: 2.3 10*3/uL (ref 0.7–4.0)
Monocytes Absolute: 0.6 10*3/uL (ref 0.1–1.0)
Monocytes Relative: 8.1 % (ref 3.0–12.0)
Platelets: 223 10*3/uL (ref 150.0–400.0)
RDW: 13.9 % (ref 11.5–14.6)

## 2012-10-06 LAB — TSH: TSH: 2.04 u[IU]/mL (ref 0.35–5.50)

## 2012-10-06 LAB — LIPID PANEL
Cholesterol: 128 mg/dL (ref 0–200)
VLDL: 24.2 mg/dL (ref 0.0–40.0)

## 2012-10-13 ENCOUNTER — Ambulatory Visit (INDEPENDENT_AMBULATORY_CARE_PROVIDER_SITE_OTHER): Payer: Medicare Other | Admitting: Family Medicine

## 2012-10-13 ENCOUNTER — Encounter: Payer: Self-pay | Admitting: Family Medicine

## 2012-10-13 VITALS — BP 118/84 | HR 68 | Temp 97.9°F | Ht 62.5 in | Wt 155.5 lb

## 2012-10-13 DIAGNOSIS — K219 Gastro-esophageal reflux disease without esophagitis: Secondary | ICD-10-CM

## 2012-10-13 DIAGNOSIS — I10 Essential (primary) hypertension: Secondary | ICD-10-CM

## 2012-10-13 DIAGNOSIS — N289 Disorder of kidney and ureter, unspecified: Secondary | ICD-10-CM

## 2012-10-13 DIAGNOSIS — M1990 Unspecified osteoarthritis, unspecified site: Secondary | ICD-10-CM

## 2012-10-13 DIAGNOSIS — E785 Hyperlipidemia, unspecified: Secondary | ICD-10-CM

## 2012-10-13 DIAGNOSIS — M129 Arthropathy, unspecified: Secondary | ICD-10-CM

## 2012-10-13 DIAGNOSIS — Z23 Encounter for immunization: Secondary | ICD-10-CM

## 2012-10-13 DIAGNOSIS — I6523 Occlusion and stenosis of bilateral carotid arteries: Secondary | ICD-10-CM

## 2012-10-13 DIAGNOSIS — I6529 Occlusion and stenosis of unspecified carotid artery: Secondary | ICD-10-CM

## 2012-10-13 DIAGNOSIS — Z0000 Encounter for general adult medical examination without abnormal findings: Secondary | ICD-10-CM

## 2012-10-13 DIAGNOSIS — I658 Occlusion and stenosis of other precerebral arteries: Secondary | ICD-10-CM

## 2012-10-13 MED ORDER — CELECOXIB 100 MG PO CAPS: 100.0000 mg | ORAL_CAPSULE | Freq: Every day | ORAL | Status: DC

## 2012-10-13 NOTE — Addendum Note (Signed)
Addended by: Josph Macho A on: 10/13/2012 10:07 AM   Modules accepted: Orders

## 2012-10-13 NOTE — Patient Instructions (Addendum)
Tetanus and pneumonia shot today. Bring me copy of living will. Try cutting celebrex back - we will see how aches do.  If returning, may start lower dose (100mg  daily). Good to see you today, call us with questions. Watch added sugars and increase aerobic exercise.

## 2012-10-13 NOTE — Assessment & Plan Note (Signed)
Chronic, stable. Continue lipitor. Reviewed #s, well controlled. rec increased aerobic exercise to increase HDL.

## 2012-10-13 NOTE — Assessment & Plan Note (Signed)
I have personally reviewed the Medicare Annual Wellness questionnaire and have noted 1. The patient's medical and social history 2. Their use of alcohol, tobacco or illicit drugs 3. Their current medications and supplements 4. The patient's functional ability including ADL's, fall risks, home safety risks and hearing or visual impairment. 5. Diet and physical activity 6. Evidence for depression or mood disorders The patients weight, height, BMI have been recorded in the chart.  Hearing and vision has been addressed. I have made referrals, counseling and provided education to the patient based review of the above and I have provided the pt with a written personalized care plan for preventive services. See scanned questionairre. Advanced directives discussed:asked to bring living will for our records.  Husband is HCPOA.  Reviewed preventative protocols and updated unless pt declined.

## 2012-10-13 NOTE — Assessment & Plan Note (Signed)
rec back off celebex to 100mg  daily given concern for mild kidney insufficiency

## 2012-10-13 NOTE — Progress Notes (Signed)
Subjective:    Patient ID: [name], female    DOB: [birth year], 72 y.o.   MRN: [id number]  HPI CC: medicare wellness visit  Wt Readings from Last 3 Encounters:  10/13/12 155 lb 8 oz (70.534 kg)  06/15/12 159 lb 12 oz (72.462 kg)    Recent MRI done of carotids - stable.  Hearing and vision screens passed today. Denies falls or depression/sadness/anhedonia.  Preventative:  Colonoscopy 03/2012 Ev[facility] Cristal) - polyp found, rec rpt 5 yrs. Mammogram 04/2012 normal  Well woman with OBGYN Dr. Luella Cook (01/2012).  Compliant with cal Vit D in diet and supplement. DEXA - told normal, done at [facility] [place] >15 yrs ago. Flu - 2013  Pneumovax - today Shingles 2013  Tetanus - Td today. Advanced directives: has living will at home.  Wants husband then daughter to be HCPOA.  Caffeine: 3 cups coffee/day  Lives with husband, 1 dog. Grown children  Occupation: homemaker  Edu: college  Activity: plays golf, walks dog, swims  Diet: good water, fruits/vegetables daily  Medications and allergies reviewed and updated in chart.  Past histories reviewed and updated if relevant as below. Patient Active Problem List   Diagnosis Date Noted  . HTN (hypertension)   . HLD (hyperlipidemia)   . Bilateral carotid artery stenosis   . Arthritis    Past Medical History  Diagnosis Date  . HTN (hypertension) 1970s  . HLD (hyperlipidemia)   . History of colon polyps   . Bilateral carotid artery stenosis     R patent, L 80-99% ([facility], Dr. Jodie Echevaria)  . Fibrocystic breast disease   . Arthritis     hands, knees, ankles  . History of melanoma 1990s    right shoulder  . History of chicken pox    Past Surgical History  Procedure Laterality Date  . Breast biopsy  1998    left  . Appendectomy  1995  . Total hip arthroplasty  2005    bilat  . Carotid stent  2002    right after restenosis post CEA (Dr. Jodie Echevaria)  . Cesarean section  1968; 1972  . Melanoma excision  1998    right shoulder  . Exploratory  laparotomy  1990s    for GI pain, inflamed lower GI, unrevealing workup, improved with abx  . Colonoscopy  03/2012    Sankar - polyp (rec rpt 5 yrs)  . Cardiovascular stress test  ~2003    WNL  . Carotid endarterectomy  2002    bilateral  . Abi  2007    R 1.19, L 1.2   History  Substance Use Topics  . Smoking status: Former Smoker -- 1.00 packs/day for 30 years  . Smokeless tobacco: Never Used  . Alcohol Use: No     Comment: 1 glass wine daily   Family History  Problem Relation Age of Onset  . Cancer Father 46    prostate  . Cancer Mother 57    lung, smoker  . Hypertension Paternal Grandfather   . CAD Neg Hx   . Stroke Neg Hx   . Diabetes Neg Hx    No Known Allergies Current Outpatient Prescriptions on File Prior to Visit  Medication Sig Dispense Refill  . atorvastatin (LIPITOR) 40 MG tablet Take 40 mg by mouth at bedtime.       . calcium-vitamin D (OSCAL WITH D) 500-200 MG-UNIT per tablet Take 1 tablet by mouth daily.      . celecoxib (CELEBREX) 200 MG capsule Take  200 mg by mouth daily.      . cetirizine (ZYRTEC) 10 MG tablet Take 10 mg by mouth daily.      . clopidogrel (PLAVIX) 75 MG tablet Take 75 mg by mouth daily.      . enalapril (VASOTEC) 20 MG tablet Take 20 mg by mouth 2 (two) times daily.      . hydrochlorothiazide (MICROZIDE) 12.5 MG capsule Take 12.5 mg by mouth daily.      . metoprolol succinate (TOPROL-XL) 50 MG 24 hr tablet Take 50 mg by mouth daily. Take with or immediately following a meal.      . Multiple Vitamin (MULTIVITAMIN) capsule Take 1 capsule by mouth daily.      Marland Kitchen loratadine (CLARITIN) 10 MG tablet Take 10 mg by mouth daily.      . ranitidine (ZANTAC) 150 MG tablet Take 150 mg by mouth daily.       No current facility-administered medications on file prior to visit.     Review of Systems  Constitutional: Negative for fever, chills, activity change, appetite change, fatigue and unexpected weight change.  HENT: Negative for hearing loss and  neck pain.   Eyes: Negative for visual disturbance.  Respiratory: Negative for cough, chest tightness, shortness of breath and wheezing.   Cardiovascular: Negative for chest pain, palpitations and leg swelling.  Gastrointestinal: Negative for nausea, vomiting, abdominal pain, diarrhea, constipation, blood in stool and abdominal distention.  Genitourinary: Negative for hematuria and difficulty urinating.  Musculoskeletal: Negative for myalgias and arthralgias.  Skin: Negative for rash.  Neurological: Negative for dizziness, seizures, syncope and headaches.  Hematological: Negative for adenopathy. Does not bruise/bleed easily.  Psychiatric/Behavioral: Negative for dysphoric mood. The patient is not nervous/anxious.        Objective:   Physical Exam  Nursing note and vitals reviewed. Constitutional: She is oriented to person, place, and time. She appears well-developed and well-nourished. No distress.  HENT:  Head: Normocephalic and atraumatic.  Right Ear: Hearing, tympanic membrane, external ear and ear canal normal.  Left Ear: Hearing, tympanic membrane, external ear and ear canal normal.  Nose: Nose normal.  Mouth/Throat: Oropharynx is clear and moist. No oropharyngeal exudate.  Eyes: Conjunctivae and EOM are normal. Pupils are equal, round, and reactive to light. No scleral icterus.  Neck: Normal range of motion. Neck supple. Carotid bruit is present (bilateral bruit).  Cardiovascular: Normal rate, regular rhythm, normal heart sounds and intact distal pulses.   No murmur heard. Pulses:      Radial pulses are 2+ on the right side, and 2+ on the left side.  Pulmonary/Chest: Effort normal and breath sounds normal. No respiratory distress. She has no wheezes. She has no rales.  CBE deferred  Abdominal: Soft. Bowel sounds are normal. She exhibits mass (RUQ/ epigastric region, nontender, superficial, likely mesh from ventral hernia repair). She exhibits no distension. There is no  tenderness. There is no rebound and no guarding.  Genitourinary:  deferred  Musculoskeletal: Normal range of motion. She exhibits no edema.  Lymphadenopathy:    She has no cervical adenopathy.  Neurological: She is alert and oriented to person, place, and time.  CN grossly intact, station and gait intact  Skin: Skin is warm and dry. No rash noted.  Psychiatric: She has a normal mood and affect. Her behavior is normal. Judgment and thought content normal.       Assessment & Plan:

## 2012-10-13 NOTE — Assessment & Plan Note (Signed)
Chronic, stable. Compliant with meds.  Continue.

## 2012-10-13 NOTE — Assessment & Plan Note (Signed)
Takes OTC PPI PRN.

## 2012-11-05 ENCOUNTER — Encounter: Payer: Self-pay | Admitting: *Deleted

## 2012-12-10 LAB — HM COLONOSCOPY

## 2013-03-17 ENCOUNTER — Other Ambulatory Visit: Payer: Self-pay

## 2013-03-29 ENCOUNTER — Encounter: Payer: Self-pay | Admitting: Interventional Cardiology

## 2013-03-29 ENCOUNTER — Ambulatory Visit (INDEPENDENT_AMBULATORY_CARE_PROVIDER_SITE_OTHER): Payer: Medicare Other | Admitting: Interventional Cardiology

## 2013-03-29 VITALS — BP 130/100 | HR 78 | Ht 62.0 in | Wt 159.0 lb

## 2013-03-29 DIAGNOSIS — I10 Essential (primary) hypertension: Secondary | ICD-10-CM

## 2013-03-29 DIAGNOSIS — I6529 Occlusion and stenosis of unspecified carotid artery: Secondary | ICD-10-CM

## 2013-03-29 NOTE — Patient Instructions (Signed)
Your physician has requested that you have a carotid duplex. This test is an ultrasound of the carotid arteries in your neck. It looks at blood flow through these arteries that supply the brain with blood. Allow one hour for this exam. There are no restrictions or special instructions.  Your physician wants you to follow-up in: 1 year with Dr. Eldridge Dace. You will receive a reminder letter in the mail two months in advance. If you don't receive a letter, please call our office to schedule the follow-up appointment.

## 2013-03-29 NOTE — Progress Notes (Signed)
Patient ID: [name], female   DOB: 1940/12/06, 72 y.o.   MRN: [id number]    [address] [place], [place]  [address] Phone: [phone] Fax:  [phone]  Date:  03/29/2013   ID:  [name], DOB [birth year], MRN [id number]  PCP:  Rebecca Boyden, MD      History of Present Illness: [name] is a 72 y.o. female who has had a right carotid stent after restenosis post CEA. She saw Dr. Jodie Mcgee at [facility]. She had been following with him for a while but is seeking local care. SHe has not had heart probelms. She has had a stress test severeal years ago. Likely around 10 years ago.  She had a left carotid stent done in 1/14.   Hypertension:  No focal weakness. She swims, walks and plays golf. No problems with those activities. 2-3 x / week. Denies : Chest pain.  Dizziness.  Leg edema.  Orthopnea.  Paroxysmal nocturnal dyspnea.  Palpitations.  Shortness of breath.  Blurry vision.  Syncope.     Wt Readings from Last 3 Encounters:  03/29/13 159 lb (72.122 kg)  10/13/12 155 lb 8 oz (70.534 kg)  06/15/12 159 lb 12 oz (72.462 kg)     Past Medical History  Diagnosis Date  . HTN (hypertension) 1970s  . HLD (hyperlipidemia)   . History of colon polyps   . Bilateral carotid artery stenosis     R patent, L 80-99% s/p stent placed [facility], Dr. Jodie Mcgee)  . Fibrocystic breast disease   . Arthritis     hands, knees, ankles  . History of melanoma 1990s    right shoulder  . History of chicken pox   . GERD (gastroesophageal reflux disease)     OTC prilosec  . Hernia 2005    Current Outpatient Prescriptions  Medication Sig Dispense Refill  . aspirin 81 MG tablet Take 81 mg by mouth daily.      Marland Kitchen atorvastatin (LIPITOR) 40 MG tablet Take 40 mg by mouth at bedtime.       . calcium-vitamin D (OSCAL WITH D) 500-200 MG-UNIT per tablet Take 1 tablet by mouth daily.      . celecoxib (CELEBREX) 100 MG capsule Take 1 capsule (100 mg total) by mouth daily.  90 capsule  3  .  cetirizine (ZYRTEC) 10 MG tablet Take 10 mg by mouth daily.      . clopidogrel (PLAVIX) 75 MG tablet Take 75 mg by mouth daily.      . enalapril (VASOTEC) 20 MG tablet Take 20 mg by mouth 2 (two) times daily.      . hydrochlorothiazide (MICROZIDE) 12.5 MG capsule Take 12.5 mg by mouth daily.      . metoprolol succinate (TOPROL-XL) 50 MG 24 hr tablet Take 50 mg by mouth daily. Take with or immediately following a meal.      . Multiple Vitamin (MULTIVITAMIN) capsule Take 1 capsule by mouth daily.      Marland Kitchen omeprazole (PRILOSEC OTC) 20 MG tablet Take 20 mg by mouth daily.       No current facility-administered medications for this visit.    Allergies:   No Known Allergies  Social History:  The patient  reports that she has quit smoking. She has never used smokeless tobacco. She reports that she does not drink alcohol or use illicit drugs.   Family History:  The patient's family history includes Cancer (age of onset: 57) in her  father; Cancer (age of onset: 20) in her mother; Hypertension in her paternal grandfather. There is no history of CAD, Stroke, or Diabetes.   ROS:  Please see the history of present illness.  No nausea, vomiting.  No fevers, chills.  No focal weakness.  No dysuria.    All other systems reviewed and negative.   PHYSICAL EXAM: VS:  BP 130/100  Pulse 78  Ht 5\' 2"  (1.575 m)  Wt 159 lb (72.122 kg)  BMI 29.07 kg/m2 Well nourished, well developed, in no acute distress HEENT: normal Neck: no JVD, 2/6 right carotid  bruits Cardiac:  normal S1, S2; RRR;  Lungs:  clear to auscultation bilaterally, no wheezing, rhonchi or rales Abd: soft, nontender, no hepatomegaly Ext: no edema Skin: warm and dry Neuro:   no focal abnormalities noted  EKG:    normal sinus rhythm, nonspecific ST segment changes   ASSESSMENT AND PLAN:  1. Occlusion and stenosis of carotid artery without mention of cerebral infarction :  Status post bilateral carotid stenting, most recently the left  internal carotid stent in January 2014. We'll plan for carotid Doppler at this time. We'll get her on a routine screening schedule for carotid restenosis. 2. Essential hypertension, benign  Continue Enalapril Maleate Tablet, 10 MG, 1 tablet, Orally, Twice a day Continue HCTZ 12.5 MG Tablet, 12.5 MG, 1 tablet, Orally, q AM Diagnostic Imaging:EKG Harward,Amy 04/05/2012 03:08:36 PM > Deette Revak,JAY 04/05/2012 03:27:58 PM > NSR, NSST  Controlled at home.  3. Joint pain, other   I informed her that since she is taking Plavix,and an ACE inhibitor, Celebrex may not be the best choice for her chronic use for pain relief. She did not get relief with over-the-counter pain medications.    Signed, Fredric Mare, MD, [facility] 03/29/2013 11:17 AM

## 2013-04-12 ENCOUNTER — Ambulatory Visit (HOSPITAL_COMMUNITY): Payer: Medicare Other | Attending: Interventional Cardiology

## 2013-04-12 DIAGNOSIS — I6529 Occlusion and stenosis of unspecified carotid artery: Secondary | ICD-10-CM | POA: Insufficient documentation

## 2013-04-12 DIAGNOSIS — I658 Occlusion and stenosis of other precerebral arteries: Secondary | ICD-10-CM | POA: Insufficient documentation

## 2013-04-20 ENCOUNTER — Encounter: Payer: Self-pay | Admitting: Interventional Cardiology

## 2013-05-10 ENCOUNTER — Ambulatory Visit: Payer: Self-pay | Admitting: General Surgery

## 2013-05-16 ENCOUNTER — Other Ambulatory Visit: Payer: Self-pay | Admitting: *Deleted

## 2013-05-16 ENCOUNTER — Other Ambulatory Visit: Payer: Self-pay

## 2013-05-16 MED ORDER — CELECOXIB 100 MG PO CAPS: 100.0000 mg | ORAL_CAPSULE | Freq: Every day | ORAL | Status: DC

## 2013-05-16 MED ORDER — ATORVASTATIN CALCIUM 40 MG PO TABS: 40.0000 mg | ORAL_TABLET | Freq: Every day | ORAL | Status: DC

## 2013-05-16 MED ORDER — ENALAPRIL MALEATE 20 MG PO TABS: 20.0000 mg | ORAL_TABLET | Freq: Two times a day (BID) | ORAL | Status: DC

## 2013-05-16 MED ORDER — HYDROCHLOROTHIAZIDE 12.5 MG PO CAPS: 12.5000 mg | ORAL_CAPSULE | Freq: Every day | ORAL | Status: DC

## 2013-05-16 MED ORDER — CLOPIDOGREL BISULFATE 75 MG PO TABS: 75.0000 mg | ORAL_TABLET | Freq: Every day | ORAL | Status: DC

## 2013-05-16 MED ORDER — METOPROLOL SUCCINATE ER 50 MG PO TB24: 50.0000 mg | ORAL_TABLET | Freq: Every day | ORAL | Status: DC

## 2013-05-16 NOTE — Telephone Encounter (Signed)
Pt request refill atorvastatin and HCTZ to [facility]. Advised done # 90 x 1.

## 2013-05-17 ENCOUNTER — Ambulatory Visit: Payer: Self-pay | Admitting: General Surgery

## 2013-05-17 ENCOUNTER — Other Ambulatory Visit: Payer: Self-pay | Admitting: *Deleted

## 2013-05-17 LAB — HM MAMMOGRAPHY

## 2013-05-17 MED ORDER — ENALAPRIL MALEATE 20 MG PO TABS: 20.0000 mg | ORAL_TABLET | Freq: Two times a day (BID) | ORAL | Status: DC

## 2013-05-17 MED ORDER — METOPROLOL SUCCINATE ER 50 MG PO TB24: 50.0000 mg | ORAL_TABLET | Freq: Every day | ORAL | Status: DC

## 2013-05-17 MED ORDER — CLOPIDOGREL BISULFATE 75 MG PO TABS: 75.0000 mg | ORAL_TABLET | Freq: Every day | ORAL | Status: DC

## 2013-05-17 MED ORDER — CELECOXIB 100 MG PO CAPS: 100.0000 mg | ORAL_CAPSULE | Freq: Every day | ORAL | Status: DC

## 2013-05-18 ENCOUNTER — Encounter: Payer: Self-pay | Admitting: General Surgery

## 2013-05-18 ENCOUNTER — Other Ambulatory Visit: Payer: Self-pay | Admitting: *Deleted

## 2013-05-18 MED ORDER — ENALAPRIL MALEATE 20 MG PO TABS: 20.0000 mg | ORAL_TABLET | Freq: Two times a day (BID) | ORAL | Status: DC

## 2013-05-22 ENCOUNTER — Other Ambulatory Visit: Payer: Self-pay | Admitting: Family Medicine

## 2013-05-23 ENCOUNTER — Encounter: Payer: Self-pay | Admitting: General Surgery

## 2013-05-23 ENCOUNTER — Ambulatory Visit (INDEPENDENT_AMBULATORY_CARE_PROVIDER_SITE_OTHER): Payer: Medicare Other | Admitting: General Surgery

## 2013-05-23 VITALS — BP 130/70 | HR 70 | Resp 16 | Ht 62.0 in | Wt 159.0 lb

## 2013-05-23 DIAGNOSIS — Z860100 Personal history of colon polyps, unspecified: Secondary | ICD-10-CM

## 2013-05-23 DIAGNOSIS — N6019 Diffuse cystic mastopathy of unspecified breast: Secondary | ICD-10-CM

## 2013-05-23 NOTE — Progress Notes (Addendum)
Patient ID: [name], female   DOB: 1940-10-15, 73 y.o.   MRN: [id number]  Chief Complaint  Patient presents with  . Follow-up    mammogram    HPI [name] is a 73 y.o. female who presents for a breast evaluation. The most recent mammogram was done on 05/17/13. Patient would also like to discuss colonoscopy Last one done 03/03/12.  Patient does perform regular self breast checks and gets regular mammograms done.  No new breast complaints.  HPI  Past Medical History  Diagnosis Date  . HTN (hypertension) 1970s  . HLD (hyperlipidemia)   . History of colon polyps   . Bilateral carotid artery stenosis     R patent, L 80-99% s/p stent placed [facility], Dr. Edd Arbour)  . Fibrocystic breast disease   . Arthritis     hands, knees, ankles  . History of melanoma 1990s    right shoulder  . History of chicken pox   . GERD (gastroesophageal reflux disease)     OTC prilosec  . Hernia 2005    Past Surgical History  Procedure Laterality Date  . Breast biopsy  1998    left  . Appendectomy  1995  . Total hip arthroplasty  2005    bilat  . Carotid stent  2002    right after restenosis post [facility] (Dr. Edd Arbour)  . Cesarean section  1968; 1972  . Melanoma excision  1998    right shoulder  . Exploratory laparotomy  1990s    for GI pain, inflamed lower GI, unrevealing workup, improved with abx  . Colonoscopy  03/2012    Sankar - polyp (rec rpt 5 yrs)  . Cardiovascular stress test  ~2003    WNL  . Carotid endarterectomy  2002    bilateral  . Abi  2007    R 1.19, L 1.2  . Carotid stent  2014    for stenosis  . Hernia repair  2005    abdomen  . Carotid stent Left 2014    Dr. Ardeth Sportsman, [facility]    Family History  Problem Relation Age of Onset  . Cancer Father 84    prostate  . Cancer Mother 90    lung, smoker  . Hypertension Paternal Grandfather   . CAD Neg Hx   . Stroke Neg Hx   . Diabetes Neg Hx     Social History History  Substance Use Topics  . Smoking status: Former  Smoker -- 1.00 packs/day for 30 years  . Smokeless tobacco: Never Used  . Alcohol Use: No     Comment: 1 glass wine daily    No Known Allergies  Current Outpatient Prescriptions  Medication Sig Dispense Refill  . aspirin 81 MG tablet Take 81 mg by mouth daily.      Marland Kitchen atorvastatin (LIPITOR) 40 MG tablet Take 1 tablet (40 mg total) by mouth at bedtime.  90 tablet  1  . calcium-vitamin D (OSCAL WITH D) 500-200 MG-UNIT per tablet Take 1 tablet by mouth daily.      . celecoxib (CELEBREX) 100 MG capsule Take 1 capsule (100 mg total) by mouth daily.  90 capsule  2  . cetirizine (ZYRTEC) 10 MG tablet Take 10 mg by mouth daily.      . clopidogrel (PLAVIX) 75 MG tablet Take 1 tablet (75 mg total) by mouth daily.  90 tablet  2  . enalapril (VASOTEC) 20 MG tablet Take 1 tablet (20 mg total) by mouth 2 (  two) times daily.  180 tablet  2  . hydrochlorothiazide (MICROZIDE) 12.5 MG capsule Take 1 capsule (12.5 mg total) by mouth daily.  90 capsule  1  . metoprolol succinate (TOPROL-XL) 50 MG 24 hr tablet Take 1 tablet (50 mg total) by mouth daily. Take with or immediately following a meal.  90 tablet  1  . Multiple Vitamin (MULTIVITAMIN) capsule Take 1 capsule by mouth daily.      Marland Kitchen omeprazole (PRILOSEC OTC) 20 MG tablet Take 20 mg by mouth daily.       No current facility-administered medications for this visit.    Review of Systems Review of Systems  Constitutional: Negative.   Respiratory: Negative.   Cardiovascular: Negative.     Blood pressure 130/70, pulse 70, resp. rate 16, height 5\' 2"  (1.575 m), weight 159 lb (72.122 kg).  Physical Exam Physical Exam  Constitutional: She is oriented to person, place, and time. She appears well-developed and well-nourished.  Eyes: Conjunctivae are normal. No scleral icterus.  Neck: Neck supple.  Cardiovascular: Normal rate, regular rhythm and normal heart sounds.   Pulmonary/Chest: Effort normal and breath sounds normal. Right breast exhibits no  inverted nipple, no mass, no nipple discharge, no skin change and no tenderness. Left breast exhibits no inverted nipple, no mass, no nipple discharge, no skin change and no tenderness.  Abdominal: Soft. Normal appearance. There is no tenderness. A hernia (incisional hernia epigastric region) is present.  Lymphadenopathy:    She has no cervical adenopathy.    She has no axillary adenopathy.  Neurological: She is alert and oriented to person, place, and time.  Skin:  Prior melanoma excision site posterior right shoulder looks clean.    Data Reviewed Mammogram reviewed and stable  Assessment    Stable follow up exam . Patient needs a follow up colonoscopy for a polyp in sigmoid with lymphoid tissue.    Plan    Patient may have colonoscopy when she is ready. This patient was instructed to call the office when ready to proceed and we will arrange accordingly.   Also, patient will be asked to return to the office in one year with a bilateral screening mammogram.    SANKAR,SEEPLAPUTHUR G 05/23/2013, 10:33 AM

## 2013-05-23 NOTE — Patient Instructions (Addendum)
Continue self breast exams. Call office for any new breast issues or concerns.  Colonoscopy A colonoscopy is an exam to look at the entire large intestine (colon). This exam can help find problems such as tumors, polyps, inflammation, and areas of bleeding. The exam takes about 1 hour.  LET [facility] CARE PROVIDER KNOW ABOUT:   Any allergies you have.  All medicines you are taking, including vitamins, herbs, eye drops, creams, and over-the-counter medicines.  Previous problems you or members of your family have had with the use of anesthetics.  Any blood disorders you have.  Previous surgeries you have had.  Medical conditions you have. RISKS AND COMPLICATIONS  Generally, this is a safe procedure. However, as with any procedure, complications can occur. Possible complications include:  Bleeding.  Tearing or rupture of the colon wall.  Reaction to medicines given during the exam.  Infection (rare). BEFORE THE PROCEDURE   Ask your health care provider about changing or stopping your regular medicines.  You may be prescribed an oral bowel prep. This involves drinking a large amount of medicated liquid, starting the day before your procedure. The liquid will cause you to have multiple loose stools until your stool is almost clear or light green. This cleans out your colon in preparation for the procedure.  Do not eat or drink anything else once you have started the bowel prep, unless your health care provider tells you it is safe to do so.  Arrange for someone to drive you home after the procedure. PROCEDURE   You will be given medicine to help you relax (sedative).  You will lie on your side with your knees bent.  A long, flexible tube with a light and camera on the end (colonoscope) will be inserted through the rectum and into the colon. The camera sends video back to a computer screen as it moves through the colon. The colonoscope also releases carbon dioxide gas to inflate  the colon. This helps your health care provider see the area better.  During the exam, your health care provider may take a small tissue sample (biopsy) to be examined under a microscope if any abnormalities are found.  The exam is finished when the entire colon has been viewed. AFTER THE PROCEDURE   Do not drive for 24 hours after the exam.  You may have a small amount of blood in your stool.  You may pass moderate amounts of gas and have mild abdominal cramping or bloating. This is caused by the gas used to inflate your colon during the exam.  Ask when your test results will be ready and how you will get your results. Make sure you get your test results. Document Released: 04/25/2000 Document Revised: 02/16/2013 Document Reviewed: 01/03/2013 [facility] Patient Information 2014 [place].  Patient may have colonoscopy when she is ready. This patient was instructed to call the office when ready to proceed and we will arrange accordingly.   Also, patient will be asked to return to the office in one year for a bilateral screening mammogram.

## 2013-06-01 ENCOUNTER — Encounter: Payer: Self-pay | Admitting: Family Medicine

## 2013-06-02 ENCOUNTER — Encounter: Payer: Self-pay | Admitting: General Surgery

## 2013-08-11 ENCOUNTER — Telehealth: Payer: Self-pay

## 2013-08-11 NOTE — Telephone Encounter (Signed)
Kim pts daughter said Mr Barsanti is at [facility] and will be there for several more weeks and then to rehab, pt has esophageal cancer and pt has perforated colon with complications from diverticulitis. Kim wants to know if Dr Darnell Level has any suggestions that Maudie Mercury needs to do for her mother in regards to medicines or appts. Mrs Emanuele tells Maudie Mercury she is fine now but Maudie Mercury wants to get Dr Synthia Innocent opinion if her mother has any immediate needs she should be aware of. Kim request cb.

## 2013-08-11 NOTE — Telephone Encounter (Signed)
Kim, the daughter, says her mother is focusing entirely on her Dad and is now living with her and the daughter wants to be sure that her mother is taken care of and doesn't miss any appointments or medications.  Maudie Mercury also wanted to be sure that you knew about her Dad being in the hospital and that he will be there for some time to come.  She is appreciative for the information and support.

## 2013-08-11 NOTE — Telephone Encounter (Signed)
I don't understand question. plz call to clarify.  Her next appt would be due 10/2013 for wellness exam. Pt should let pharmacy know when she's low on meds and we will send refills in.

## 2013-08-24 ENCOUNTER — Other Ambulatory Visit: Payer: Self-pay | Admitting: Family Medicine

## 2013-08-24 ENCOUNTER — Encounter: Payer: Self-pay | Admitting: Family Medicine

## 2013-10-08 ENCOUNTER — Other Ambulatory Visit: Payer: Self-pay | Admitting: Family Medicine

## 2013-10-08 DIAGNOSIS — E785 Hyperlipidemia, unspecified: Secondary | ICD-10-CM

## 2013-10-08 DIAGNOSIS — N289 Disorder of kidney and ureter, unspecified: Secondary | ICD-10-CM

## 2013-10-08 DIAGNOSIS — I10 Essential (primary) hypertension: Secondary | ICD-10-CM

## 2013-10-10 ENCOUNTER — Other Ambulatory Visit (INDEPENDENT_AMBULATORY_CARE_PROVIDER_SITE_OTHER): Payer: Medicare Other

## 2013-10-10 DIAGNOSIS — E785 Hyperlipidemia, unspecified: Secondary | ICD-10-CM

## 2013-10-10 DIAGNOSIS — I10 Essential (primary) hypertension: Secondary | ICD-10-CM

## 2013-10-10 DIAGNOSIS — N289 Disorder of kidney and ureter, unspecified: Secondary | ICD-10-CM

## 2013-10-10 LAB — BASIC METABOLIC PANEL
BUN: 19 mg/dL (ref 6–23)
CHLORIDE: 104 meq/L (ref 96–112)
CO2: 27 meq/L (ref 19–32)
CREATININE: 1.1 mg/dL (ref 0.4–1.2)
Calcium: 9.5 mg/dL (ref 8.4–10.5)
GFR: 50.94 mL/min — ABNORMAL LOW (ref >60.00)
Glucose, Bld: 99 mg/dL (ref 70–99)
Potassium: 4 mEq/L (ref 3.5–5.1)
Sodium: 139 mEq/L (ref 135–145)

## 2013-10-10 LAB — LIPID PANEL
CHOL/HDL RATIO: 3
Cholesterol: 114 mg/dL (ref 0–200)
HDL: 35.1 mg/dL — AB (ref >39.00)
LDL CALC: 57 mg/dL (ref 0–99)
TRIGLYCERIDES: 108 mg/dL (ref 0.0–149.0)
VLDL: 21.6 mg/dL (ref 0.0–40.0)

## 2013-10-17 ENCOUNTER — Encounter: Payer: Medicare Other | Admitting: Family Medicine

## 2013-10-25 ENCOUNTER — Ambulatory Visit (INDEPENDENT_AMBULATORY_CARE_PROVIDER_SITE_OTHER): Payer: Medicare Other | Admitting: Family Medicine

## 2013-10-25 ENCOUNTER — Encounter: Payer: Self-pay | Admitting: Family Medicine

## 2013-10-25 VITALS — BP 130/86 | HR 64 | Temp 98.2°F | Ht 62.5 in | Wt 138.0 lb

## 2013-10-25 DIAGNOSIS — Z0000 Encounter for general adult medical examination without abnormal findings: Secondary | ICD-10-CM | POA: Insufficient documentation

## 2013-10-25 DIAGNOSIS — I6523 Occlusion and stenosis of bilateral carotid arteries: Secondary | ICD-10-CM

## 2013-10-25 DIAGNOSIS — I10 Essential (primary) hypertension: Secondary | ICD-10-CM

## 2013-10-25 DIAGNOSIS — N183 Chronic kidney disease, stage 3 unspecified: Secondary | ICD-10-CM

## 2013-10-25 DIAGNOSIS — E785 Hyperlipidemia, unspecified: Secondary | ICD-10-CM

## 2013-10-25 NOTE — Assessment & Plan Note (Signed)
Chronic, well controlled continue lipitor 80mg  daily.

## 2013-10-25 NOTE — Patient Instructions (Signed)
Return at your convenience for prevnar (pneumonia shot). Return as needed or in 1 year for next physical. Good to see you today, call us with quesitons. You are doing well. Hang in there.

## 2013-10-25 NOTE — Assessment & Plan Note (Signed)
Chronic, stable. Continue medications. Now with HTN retinopathy on latest eye exam.

## 2013-10-25 NOTE — Assessment & Plan Note (Signed)
R bruit on exam today. This is monitored closely by cardiology

## 2013-10-25 NOTE — Progress Notes (Addendum)
BP 130/86  Pulse 64  Temp(Src) 98.2 F (36.8 C) (Oral)  Ht 5' 2.5" (1.588 m)  Wt 138 lb (62.596 kg)  BMI 24.82 kg/m2   CC: [employer] medicare wellness visit  Subjective:    Patient ID: [name], female    DOB: 05-02-1941, 73 y.o.   MRN: [id number]  HPI: [name] is a 73 y.o. female presenting on 10/25/2013 for Annual Exam   Husband with esoph cancer currently in [facility] - picc and port infection. PICC to be replaced this afternoon. On TPN as well. Planned re anastomosis after complicated diverticulitis infection. Significant caregiver stress with this - driving to [facility] daily. Pt taking 1 day at a time.  Wt Readings from Last 3 Encounters:  10/25/13 138 lb (62.596 kg)  05/23/13 159 lb (72.122 kg)  03/29/13 159 lb (72.122 kg)   Body mass index is 24.82 kg/(m^2).   Hearing and vision screens passed today. H/o hypertensive retinopathy. F/u scheduled Q6 mo. Denies falls or depression/sadness/anhedonia.   Preventative: Colonoscopy 03/2012 [facility]) - polyp found, rec rpt 5 yrs.  Mammogram 05/2013 birads 1 Well woman with OBGYN Dr. Laurey Morale (last 07/2013). Watching ovarian cyst. Compliant with cal Vit D in diet and supplement.  DEXA - told normal, done at [facility], [place] >15 yrs ago.  Flu - 2014. Pneumovax - 10/2012. prevnar - will return for this when husband feels better. Shingles 2014 Td 10/2012 Advanced directives: has living will at home. Wants husband then daughter to be HCPOA.   Caffeine: 3 cups coffee/day  Lives with husband, 1 dog. Grown children  Occupation: homemaker  Edu: college  Activity: plays golf, walks dog, swims  Diet: good water, fruits/vegetables daily   Relevant past medical, surgical, family and social history reviewed and updated as indicated.  Allergies and medications reviewed and updated. Current Outpatient Prescriptions on File Prior to Visit  Medication Sig  . aspirin 81 MG tablet Take 81 mg by mouth daily.  Marland Kitchen atorvastatin (LIPITOR) 40  MG tablet Take 1 tablet (40 mg total) by mouth at bedtime.  . calcium-vitamin D (OSCAL WITH D) 500-200 MG-UNIT per tablet Take 1 tablet by mouth daily.  . celecoxib (CELEBREX) 100 MG capsule Take 1 capsule (100 mg total) by mouth daily.  . cetirizine (ZYRTEC) 10 MG tablet Take 10 mg by mouth daily.  . clopidogrel (PLAVIX) 75 MG tablet Take 1 tablet (75 mg total) by mouth daily.  . hydrochlorothiazide (MICROZIDE) 12.5 MG capsule Take 1 capsule (12.5 mg total) by mouth daily.  . metoprolol succinate (TOPROL-XL) 50 MG 24 hr tablet Take 1 tablet (50 mg total) by mouth daily. Take with or immediately following a meal.  . Multiple Vitamin (MULTIVITAMIN) capsule Take 1 capsule by mouth daily.   No current facility-administered medications on file prior to visit.    Review of Systems  Constitutional: Negative for fever, chills, activity change, appetite change, fatigue and unexpected weight change.  HENT: Negative for hearing loss.   Eyes: Negative for visual disturbance.  Respiratory: Negative for cough, chest tightness, shortness of breath and wheezing.   Cardiovascular: Negative for chest pain, palpitations and leg swelling.  Gastrointestinal: Negative for nausea, vomiting, abdominal pain, diarrhea, constipation, blood in stool and abdominal distention.  Genitourinary: Negative for hematuria and difficulty urinating.  Musculoskeletal: Negative for arthralgias, myalgias and neck pain.  Skin: Negative for rash.  Neurological: Negative for dizziness, seizures, syncope and headaches.  Hematological: Negative for adenopathy. Does not bruise/bleed easily.  Psychiatric/Behavioral: Negative  for dysphoric mood. The patient is not nervous/anxious.    Per HPI unless specifically indicated above    Objective:    BP 130/86  Pulse 64  Temp(Src) 98.2 F (36.8 C) (Oral)  Ht 5' 2.5" (1.588 m)  Wt 138 lb (62.596 kg)  BMI 24.82 kg/m2  Physical Exam  Nursing note and vitals reviewed. Constitutional:  She is oriented to person, place, and time. She appears well-developed and well-nourished. No distress.  HENT:  Head: Normocephalic and atraumatic.  Right Ear: Hearing, tympanic membrane, external ear and ear canal normal.  Left Ear: Hearing, tympanic membrane, external ear and ear canal normal.  Nose: Nose normal.  Mouth/Throat: Uvula is midline, oropharynx is clear and moist and mucous membranes are normal. No oropharyngeal exudate, posterior oropharyngeal edema or posterior oropharyngeal erythema.  Eyes: Conjunctivae and EOM are normal. Pupils are equal, round, and reactive to light. No scleral icterus.  Neck: Normal range of motion. Neck supple. Carotid bruit is present (bilat R>L). No thyromegaly present.  Cardiovascular: Normal rate, regular rhythm, normal heart sounds and intact distal pulses.   No murmur heard. Pulses:      Radial pulses are 2+ on the right side, and 2+ on the left side.  Pulmonary/Chest: Effort normal and breath sounds normal. No respiratory distress. She has no wheezes. She has no rales.  Deferred - per GYN  Abdominal: Soft. Bowel sounds are normal. She exhibits no distension and no mass. There is no tenderness. There is no rebound and no guarding.  Genitourinary:  Deferred per GYN  Musculoskeletal: Normal range of motion. She exhibits no edema.  Lymphadenopathy:    She has no cervical adenopathy.  Neurological: She is alert and oriented to person, place, and time.  CN grossly intact, station and gait intact Recall 3/3 Calculation 4/5 serial 3s  Skin: Skin is warm and dry. No rash noted.  Psychiatric: She has a normal mood and affect. Her behavior is normal. Judgment and thought content normal.   Results for orders placed in visit on 36/64/40  BASIC METABOLIC PANEL      Result Value Ref Range   Sodium 139  135 - 145 mEq/L   Potassium 4.0  3.5 - 5.1 mEq/L   Chloride 104  96 - 112 mEq/L   CO2 27  19 - 32 mEq/L   Glucose, Bld 99  70 - 99 mg/dL   BUN 19  6 -  23 mg/dL   Creatinine, Ser 1.1  0.4 - 1.2 mg/dL   Calcium 9.5  8.4 - 10.5 mg/dL   GFR 50.94 (*) >60.00 mL/min  LIPID PANEL      Result Value Ref Range   Cholesterol 114  0 - 200 mg/dL   Triglycerides 108.0  0.0 - 149.0 mg/dL   HDL 35.10 (*) >39.00 mg/dL   VLDL 21.6  0.0 - 40.0 mg/dL   LDL Cholesterol 57  0 - 99 mg/dL   Total CHOL/HDL Ratio 3        Assessment & Plan:   Problem List Items Addressed This Visit   Medicare annual wellness visit, subsequent - Primary     I have personally reviewed the Medicare Annual Wellness questionnaire and have noted 1. The patient's medical and social history 2. Their use of alcohol, tobacco or illicit drugs 3. Their current medications and supplements 4. The patient's functional ability including ADL's, fall risks, home safety risks and hearing or visual impairment. 5. Diet and physical activity 6. Evidence for depression or mood  disorders The patients weight, height, BMI have been recorded in the chart.  Hearing and vision has been addressed. I have made referrals, counseling and provided education to the patient based review of the above and I have provided the pt with a written personalized care plan for preventive services. See scanned questionairre. Advanced directives discussed: has this at home.  Reviewed preventative protocols and updated unless pt declined. Well woman with OBGYN Dr. Laurey Morale.    HTN (hypertension)     Chronic, stable. Continue medications. Now with HTN retinopathy on latest eye exam.    Relevant Medications      enalapril (VASOTEC) 20 MG tablet   HLD (hyperlipidemia)     Chronic, well controlled continue lipitor 80mg  daily.    Relevant Medications      enalapril (VASOTEC) 20 MG tablet   Health care maintenance   CKD (chronic kidney disease) stage 3, GFR 30-59 ml/min     Reviewed dx with patient, seems present since 2012. Encouraged avoidance of NSAIDs as well as increased water intake    Bilateral carotid  artery stenosis     R bruit on exam today. This is monitored closely by cardiology    Relevant Medications      enalapril (VASOTEC) 20 MG tablet       Follow up plan: Return in about 1 year (around 10/26/2014), or as needed, for annual exam, prior fasting for blood work.

## 2013-10-25 NOTE — Assessment & Plan Note (Signed)
I have personally reviewed the Medicare Annual Wellness questionnaire and have noted 1. The patient's medical and social history 2. Their use of alcohol, tobacco or illicit drugs 3. Their current medications and supplements 4. The patient's functional ability including ADL's, fall risks, home safety risks and hearing or visual impairment. 5. Diet and physical activity 6. Evidence for depression or mood disorders The patients weight, height, BMI have been recorded in the chart.  Hearing and vision has been addressed. I have made referrals, counseling and provided education to the patient based review of the above and I have provided the pt with a written personalized care plan for preventive services. See scanned questionairre. Advanced directives discussed: has this at home.  Reviewed preventative protocols and updated unless pt declined. Well woman with OBGYN Dr. Laurey Morale.

## 2013-10-25 NOTE — Assessment & Plan Note (Signed)
Reviewed dx with patient, seems present since 2012. Encouraged avoidance of NSAIDs as well as increased water intake

## 2013-10-26 ENCOUNTER — Telehealth: Payer: Self-pay | Admitting: Family Medicine

## 2013-10-26 NOTE — Telephone Encounter (Signed)
Relevant patient education assigned to patient using Emmi. ° °

## 2013-11-22 ENCOUNTER — Other Ambulatory Visit: Payer: Self-pay | Admitting: *Deleted

## 2013-11-22 MED ORDER — ENALAPRIL MALEATE 20 MG PO TABS: 10.0000 mg | ORAL_TABLET | Freq: Two times a day (BID) | ORAL | Status: DC

## 2013-11-22 MED ORDER — HYDROCHLOROTHIAZIDE 12.5 MG PO CAPS: 12.5000 mg | ORAL_CAPSULE | Freq: Every day | ORAL | Status: DC

## 2013-11-22 MED ORDER — CLOPIDOGREL BISULFATE 75 MG PO TABS: 75.0000 mg | ORAL_TABLET | Freq: Every day | ORAL | Status: DC

## 2013-11-22 MED ORDER — CELECOXIB 100 MG PO CAPS: 100.0000 mg | ORAL_CAPSULE | Freq: Every day | ORAL | Status: DC

## 2013-11-22 MED ORDER — ATORVASTATIN CALCIUM 40 MG PO TABS: 40.0000 mg | ORAL_TABLET | Freq: Every day | ORAL | Status: DC

## 2013-11-22 MED ORDER — METOPROLOL SUCCINATE ER 50 MG PO TB24: 50.0000 mg | ORAL_TABLET | Freq: Every day | ORAL | Status: DC

## 2013-12-30 ENCOUNTER — Encounter (HOSPITAL_COMMUNITY): Payer: Medicare Other

## 2014-01-03 ENCOUNTER — Other Ambulatory Visit: Payer: Self-pay | Admitting: Cardiology

## 2014-01-03 DIAGNOSIS — I6529 Occlusion and stenosis of unspecified carotid artery: Secondary | ICD-10-CM

## 2014-03-13 ENCOUNTER — Encounter: Payer: Self-pay | Admitting: Family Medicine

## 2014-03-20 ENCOUNTER — Ambulatory Visit (INDEPENDENT_AMBULATORY_CARE_PROVIDER_SITE_OTHER): Payer: Medicare Other | Admitting: Interventional Cardiology

## 2014-03-20 ENCOUNTER — Ambulatory Visit (HOSPITAL_COMMUNITY): Payer: Medicare Other | Attending: Internal Medicine | Admitting: Cardiology

## 2014-03-20 ENCOUNTER — Encounter: Payer: Self-pay | Admitting: Interventional Cardiology

## 2014-03-20 VITALS — BP 120/76 | HR 61 | Ht 62.0 in | Wt 125.0 lb

## 2014-03-20 DIAGNOSIS — R0989 Other specified symptoms and signs involving the circulatory and respiratory systems: Secondary | ICD-10-CM | POA: Diagnosis not present

## 2014-03-20 DIAGNOSIS — I6523 Occlusion and stenosis of bilateral carotid arteries: Secondary | ICD-10-CM

## 2014-03-20 DIAGNOSIS — E785 Hyperlipidemia, unspecified: Secondary | ICD-10-CM | POA: Insufficient documentation

## 2014-03-20 DIAGNOSIS — I10 Essential (primary) hypertension: Secondary | ICD-10-CM | POA: Insufficient documentation

## 2014-03-20 DIAGNOSIS — M2550 Pain in unspecified joint: Secondary | ICD-10-CM | POA: Insufficient documentation

## 2014-03-20 DIAGNOSIS — I6529 Occlusion and stenosis of unspecified carotid artery: Secondary | ICD-10-CM

## 2014-03-20 DIAGNOSIS — Z87891 Personal history of nicotine dependence: Secondary | ICD-10-CM | POA: Insufficient documentation

## 2014-03-20 NOTE — Patient Instructions (Signed)
Your physician wants you to follow-up in: 12 months with Dr Varanasi You will receive a reminder letter in the mail two months in advance. If you don't receive a letter, please call our office to schedule the follow-up appointment.  

## 2014-03-20 NOTE — Progress Notes (Signed)
Carotid duplex performed 

## 2014-03-20 NOTE — Progress Notes (Signed)
Patient ID: [name], female   DOB: 19-Jan-1941, 73 y.o.   MRN: [id number] Patient ID: [name], female   DOB: [birth year], 73 y.o.   MRN: [id number]    [place], [place], [place]  [address] Phone: [phone] Fax:  [phone]  Date:  03/20/2014   ID:  NAAMA SAPPINGTON, DOB 1940-07-07, MRN [id number]  PCP:  Ria Bush, MD      History of Present Illness: TATIANNA Mcgee is a 73 y.o. female who has had a right carotid stent after restenosis post CEA. She saw Dr. Edd Arbour at [facility]. She had been following with him for a while but is seeking local care. SHe has not had heart probelms. She has had a stress test severeal years ago which was negative. Likely around 10-12 years ago.  She had a left carotid stent done in 1/14.   Hypertension:  No focal weakness. She ued to swim, walk and play golf. Not doing those activities since her husband passed away. Denies : Chest pain.  Dizziness.  Leg edema.  Orthopnea.  Paroxysmal nocturnal dyspnea.  Palpitations.  Shortness of breath.  Blurry vision.  Syncope.   No focal weakness.  Her husband passed away in Feb 21, 2023.  She has been losing weight.  She is eating well.  Daughter lives in [place].  No recent lightheadedness or syncope.   The patient has joined the [employer].  She walks there and does some exercise classes-limited by hip problems.   Wt Readings from Last 3 Encounters:  03/20/14 125 lb (56.7 kg)  10/25/13 138 lb (62.596 kg)  05/23/13 159 lb (72.122 kg)     Past Medical History  Diagnosis Date  . HTN (hypertension) 1970s  . HLD (hyperlipidemia)   . History of colon polyps   . Bilateral carotid artery stenosis     R patent, L 80-99% s/p stent placed [facility], Dr. Edd Arbour)  . Fibrocystic breast disease   . Arthritis     hands, knees, ankles  . History of melanoma 1990s    right shoulder  . History of chicken pox   . GERD (gastroesophageal reflux disease)     OTC prilosec  . Hernia 2005  . Hypertensive retinopathy  2015    stage 1 (Bulakowski)    Current Outpatient Prescriptions  Medication Sig Dispense Refill  . aspirin 81 MG tablet Take 81 mg by mouth daily.    Marland Kitchen atorvastatin (LIPITOR) 40 MG tablet Take 1 tablet (40 mg total) by mouth at bedtime. 90 tablet 3  . calcium-vitamin D (OSCAL WITH D) 500-200 MG-UNIT per tablet Take 1 tablet by mouth daily.    . celecoxib (CELEBREX) 100 MG capsule Take 1 capsule (100 mg total) by mouth daily. 90 capsule 3  . cetirizine (ZYRTEC) 10 MG tablet Take 10 mg by mouth daily.    . clopidogrel (PLAVIX) 75 MG tablet Take 1 tablet (75 mg total) by mouth daily. 90 tablet 3  . enalapril (VASOTEC) 10 MG tablet Take 10 mg by mouth 2 (two) times daily.    Marland Kitchen esomeprazole (NEXIUM) 10 MG packet Take 10 mg by mouth daily before breakfast.    . fluticasone (FLONASE) 50 MCG/ACT nasal spray Place 1 spray into both nostrils daily.    . hydrochlorothiazide (MICROZIDE) 12.5 MG capsule Take 1 capsule (12.5 mg total) by mouth daily. 90 capsule 3  . metoprolol succinate (TOPROL-XL) 50 MG 24 hr tablet Take 1 tablet (50 mg total) by mouth  daily. Take with or immediately following a meal. 90 tablet 3  . Multiple Vitamin (MULTIVITAMIN) capsule Take 1 capsule by mouth daily.     No current facility-administered medications for this visit.    Allergies:   No Known Allergies  Social History:  The patient  reports that she has quit smoking. She has never used smokeless tobacco. She reports that she does not drink alcohol or use illicit drugs.   Family History:  The patient's family history includes Cancer (age of onset: 33) in her father; Cancer (age of onset: 40) in her mother; Hypertension in her paternal grandfather. There is no history of CAD, Stroke, or Diabetes.   ROS:  Please see the history of present illness.  No nausea, vomiting.  No fevers, chills.  No focal weakness.  No dysuria.    All other systems reviewed and negative.   PHYSICAL EXAM: VS:  BP 120/76 mmHg  Pulse 61  Ht 5'  2" (1.575 m)  Wt 125 lb (56.7 kg)  BMI 22.86 kg/m2 Well nourished, well developed, in no acute distress HEENT: normal Neck: no JVD, 2/6 right carotid  bruits Cardiac:  normal S1, S2; RRR;  Lungs:  clear to auscultation bilaterally, no wheezing, rhonchi or rales Abd: soft, nontender, no hepatomegaly Ext: no edema Skin: warm and dry Neuro:   no focal abnormalities noted Psych: flat affect-   EKG:    normal sinus rhythm, nonspecific ST segment changes   ASSESSMENT AND PLAN:  1. Occlusion and stenosis of carotid artery without mention of cerebral infarction :  Status post bilateral carotid stenting, most recently the left internal carotid stent in January 2014. She had a  carotid Doppler today. We'll get her on a routine screening schedule for carotid restenosis. Mild disease bilaterally today.  Repeat study in one year.  2. Essential hypertension, benign  Continue Enalapril Maleate Tablet, 10 MG, 1 tablet, Orally, Twice a day Continue HCTZ 12.5 MG Tablet, 12.5 MG, 1 tablet, Orally, q AM  Controlled at home.  No low BPs despite weight loss.    3. Joint pain, other   I informed her that since she is taking Plavix,and an ACE inhibitor, Celebrex may not be the best choice for her chronic use for pain relief. She did not get relief with over-the-counter pain medications.  No GI bleeding on the current combination.  Tolerating the lower dose of Celebrex with adequate pain relief.    Hyperlipidemia:  COntinue statin.  LDL target < 100.  LDL 57 in 6/15.     Signed, Mina Marble, MD, [facility] 03/20/2014 11:22 AM

## 2014-05-12 DIAGNOSIS — C4491 Basal cell carcinoma of skin, unspecified: Secondary | ICD-10-CM

## 2014-05-12 DIAGNOSIS — N281 Cyst of kidney, acquired: Secondary | ICD-10-CM

## 2014-05-12 HISTORY — DX: Cyst of kidney, acquired: N28.1

## 2014-05-12 HISTORY — DX: Basal cell carcinoma of skin, unspecified: C44.91

## 2014-05-18 ENCOUNTER — Ambulatory Visit: Payer: Self-pay | Admitting: General Surgery

## 2014-05-18 DIAGNOSIS — Z1231 Encounter for screening mammogram for malignant neoplasm of breast: Secondary | ICD-10-CM | POA: Diagnosis not present

## 2014-05-19 ENCOUNTER — Encounter: Payer: Self-pay | Admitting: General Surgery

## 2014-05-24 ENCOUNTER — Ambulatory Visit (INDEPENDENT_AMBULATORY_CARE_PROVIDER_SITE_OTHER): Payer: Medicare Other | Admitting: General Surgery

## 2014-05-24 ENCOUNTER — Encounter: Payer: Self-pay | Admitting: General Surgery

## 2014-05-24 VITALS — BP 112/74 | HR 70 | Resp 12 | Ht 62.0 in | Wt 136.0 lb

## 2014-05-24 DIAGNOSIS — Z85820 Personal history of malignant melanoma of skin: Secondary | ICD-10-CM | POA: Diagnosis not present

## 2014-05-24 DIAGNOSIS — N6019 Diffuse cystic mastopathy of unspecified breast: Secondary | ICD-10-CM | POA: Diagnosis not present

## 2014-05-24 NOTE — Progress Notes (Signed)
Patient ID: [name], female   DOB: 1940-09-19, 74 y.o.   MRN: [id number]  Chief Complaint  Patient presents with  . Follow-up    mammogram    HPI [name] is a 74 y.o. female who presents for a breast evaluation. The most recent mammogram was done on 05/18/14 .  Patient does perform regular self breast checks and gets regular mammograms done.  Her husband died few mos ago from metastatic esophageal cancer. She is still emotional about it but states she is doing better.  HPI  Past Medical History  Diagnosis Date  . HTN (hypertension) 1970s  . HLD (hyperlipidemia)   . History of colon polyps   . Bilateral carotid artery stenosis     R patent, L 80-99% s/p stent placed [facility], Dr. Edd Arbour)  . Fibrocystic breast disease   . Arthritis     hands, knees, ankles  . History of melanoma 1990s    right shoulder  . History of chicken pox   . GERD (gastroesophageal reflux disease)     OTC prilosec  . Hernia 2005  . Hypertensive retinopathy 2015    stage 1 (Bulakowski)    Past Surgical History  Procedure Laterality Date  . Breast biopsy  1998    left  . Appendectomy  1995  . Total hip arthroplasty  2005    bilat  . Carotid stent Right 2002    right after restenosis post CEA (Dr. Edd Arbour)  . Cesarean section  1968; 1972  . Melanoma excision  1998    right shoulder  . Exploratory laparotomy  1990s    for GI pain, inflamed lower GI, unrevealing workup, improved with abx  . Colonoscopy  03/2012    Muadh Creasy - polyp (rec rpt 5 yrs)  . Cardiovascular stress test  ~2003    WNL  . Carotid endarterectomy  2002    bilateral  . Abi  2007    R 1.19, L 1.2  . Carotid stent  2014    for stenosis  . Hernia repair  2005    abdomen  . Carotid stent Left 2014    Dr. Ardeth Sportsman, [facility]    Family History  Problem Relation Age of Onset  . Cancer Father 78    prostate  . Cancer Mother 1    lung, smoker  . Hypertension Paternal Grandfather   . CAD Neg Hx   . Stroke Neg Hx   .  Diabetes Neg Hx     Social History History  Substance Use Topics  . Smoking status: Former Smoker -- 1.00 packs/day for 30 years  . Smokeless tobacco: Never Used  . Alcohol Use: No     Comment: 1 glass wine daily    No Known Allergies  Current Outpatient Prescriptions  Medication Sig Dispense Refill  . aspirin 81 MG tablet Take 81 mg by mouth daily.    Marland Kitchen atorvastatin (LIPITOR) 40 MG tablet Take 1 tablet (40 mg total) by mouth at bedtime. 90 tablet 3  . calcium-vitamin D (OSCAL WITH D) 500-200 MG-UNIT per tablet Take 1 tablet by mouth daily.    . celecoxib (CELEBREX) 100 MG capsule Take 1 capsule (100 mg total) by mouth daily. 90 capsule 3  . cetirizine (ZYRTEC) 10 MG tablet Take 10 mg by mouth daily.    . clopidogrel (PLAVIX) 75 MG tablet Take 1 tablet (75 mg total) by mouth daily. 90 tablet 3  . enalapril (VASOTEC) 10 MG tablet Take 10  mg by mouth 2 (two) times daily.    Marland Kitchen esomeprazole (NEXIUM) 10 MG packet Take 10 mg by mouth daily before breakfast.    . fluticasone (FLONASE) 50 MCG/ACT nasal spray Place 1 spray into both nostrils daily.    . hydrochlorothiazide (MICROZIDE) 12.5 MG capsule Take 1 capsule (12.5 mg total) by mouth daily. 90 capsule 3  . metoprolol succinate (TOPROL-XL) 50 MG 24 hr tablet Take 1 tablet (50 mg total) by mouth daily. Take with or immediately following a meal. 90 tablet 3  . Multiple Vitamin (MULTIVITAMIN) capsule Take 1 capsule by mouth daily.     No current facility-administered medications for this visit.    Review of Systems Review of Systems  Constitutional: Negative.   Respiratory: Negative.   Cardiovascular: Negative.     Blood pressure 112/74, pulse 70, resp. rate 12, height 5\' 2"  (1.575 m), weight 136 lb (61.689 kg).  Physical Exam Physical Exam  Constitutional: She is oriented to person, place, and time. She appears well-developed and well-nourished.  Eyes: Conjunctivae are normal. No scleral icterus.  Neck: Neck supple.    Cardiovascular: Normal rate, regular rhythm and normal heart sounds.   Pulmonary/Chest: Effort normal and breath sounds normal. Right breast exhibits no inverted nipple, no mass, no nipple discharge, no skin change and no tenderness. Left breast exhibits no inverted nipple, no mass, no nipple discharge, no skin change and no tenderness. Breasts are symmetrical.  Abdominal: Soft. Normal appearance and bowel sounds are normal. There is no hepatomegaly. There is no tenderness. A hernia ( (incisional hernia epigastric region) is present.    Lymphadenopathy:    She has no cervical adenopathy.    She has no axillary adenopathy.  Neurological: She is alert and oriented to person, place, and time.  Skin: Skin is warm and dry.       Data Reviewed Mammogram reviewed and stable  Assessment    Stable exam.History of melanoma Right shoulder. No evidence of recuurence    Plan    Patient will be asked to return to the office in one year with a bilateral screening mammogram.       Marijean Montanye G 05/24/2014, 1:49 PM

## 2014-05-24 NOTE — Patient Instructions (Addendum)
The patient has been asked to return to the office in one year with a bilateral diagnostic mammogram.Continue self breast exams. Call office for any new breast issues or concerns.  

## 2014-05-31 ENCOUNTER — Encounter: Payer: Self-pay | Admitting: Family Medicine

## 2014-07-17 DIAGNOSIS — H35033 Hypertensive retinopathy, bilateral: Secondary | ICD-10-CM | POA: Diagnosis not present

## 2014-07-17 DIAGNOSIS — H2513 Age-related nuclear cataract, bilateral: Secondary | ICD-10-CM | POA: Diagnosis not present

## 2014-07-17 DIAGNOSIS — H524 Presbyopia: Secondary | ICD-10-CM | POA: Diagnosis not present

## 2014-08-08 ENCOUNTER — Telehealth: Payer: Self-pay

## 2014-08-08 NOTE — Telephone Encounter (Signed)
Left message for pt to call back if she still wants flu vaccine 

## 2014-08-09 ENCOUNTER — Telehealth: Payer: Self-pay | Admitting: Family Medicine

## 2014-08-09 NOTE — Telephone Encounter (Signed)
Pt dropped off note with vaccine dates. Flu shot March 2016 at [facility] [place] 2015 Placing on Kim's desk

## 2014-08-09 NOTE — Telephone Encounter (Signed)
Chart updated

## 2014-09-03 NOTE — Op Note (Signed)
PATIENT NAME:  [name], [name] MR#:  [id number] DATE OF BIRTH:  May 15, 1940  DATE OF PROCEDURE:  07/01/2011  PREOPERATIVE DIAGNOSIS: Anorectal polyp.   POSTOPERATIVE DIAGNOSIS: Anorectal polyp.   OPERATION: Transanal excision of anorectal polyp.   SURGEON: S.G. Jamal Collin, M.D.   ANESTHESIA: General.   COMPLICATIONS: None.   ESTIMATED BLOOD LOSS: Minimal.   DRAINS: None.   DESCRIPTION OF PROCEDURE: The patient was put to sleep with LMA and then placed in the lithotomy position on the operating table. The anal area was prepped and draped out as a sterile field. Examination of the external anal area showed no evidence of external hemorrhoids or fissure. A speculum was then utilized to visualize the anorectal rectal space, and located anteriorly was a 2-cm long polypoid structure measuring about 5 to 6-mm in thickness with a base that was attached to fairly redundant mucosa. This appeared to be right at the junction of the anal and the rectal mucosal. With careful exposure, the base of this was infiltrated with 0.5% Marcaine containing epi for postoperative analgesia. A LigaSure device was then utilized to seal and transect this polyp along with the broad base and the redundant mucosa attached. Hemostasis was intact. Visualization of the rest of the anal canal and the lower rectum showed no abnormalities. The procedure was well tolerated. She was subsequently returned to the recovery room in stable condition.   ____________________________ S.Robinette Haines, MD sgs:bjt D:  07/01/2011 08:24:27 ET          T: 07/01/2011 09:27:12 ET        JOB#: [id number] Robinette Haines MD ELECTRONICALLY SIGNED 07/01/2011 11:08

## 2014-10-11 DIAGNOSIS — Z85828 Personal history of other malignant neoplasm of skin: Secondary | ICD-10-CM | POA: Diagnosis not present

## 2014-10-11 DIAGNOSIS — L821 Other seborrheic keratosis: Secondary | ICD-10-CM | POA: Diagnosis not present

## 2014-10-11 DIAGNOSIS — C44612 Basal cell carcinoma of skin of right upper limb, including shoulder: Secondary | ICD-10-CM | POA: Diagnosis not present

## 2014-10-11 DIAGNOSIS — Z85820 Personal history of malignant melanoma of skin: Secondary | ICD-10-CM | POA: Diagnosis not present

## 2014-10-11 DIAGNOSIS — L570 Actinic keratosis: Secondary | ICD-10-CM | POA: Diagnosis not present

## 2014-10-19 ENCOUNTER — Encounter: Payer: Self-pay | Admitting: Family Medicine

## 2014-10-30 ENCOUNTER — Other Ambulatory Visit (INDEPENDENT_AMBULATORY_CARE_PROVIDER_SITE_OTHER): Payer: Self-pay

## 2014-10-30 ENCOUNTER — Other Ambulatory Visit: Payer: Self-pay | Admitting: Family Medicine

## 2014-10-30 DIAGNOSIS — N183 Chronic kidney disease, stage 3 unspecified: Secondary | ICD-10-CM

## 2014-10-30 DIAGNOSIS — I10 Essential (primary) hypertension: Secondary | ICD-10-CM

## 2014-10-30 DIAGNOSIS — E785 Hyperlipidemia, unspecified: Secondary | ICD-10-CM

## 2014-10-30 LAB — CBC WITH DIFFERENTIAL/PLATELET
BASOS PCT: 0.6 % (ref 0.0–3.0)
Basophils Absolute: 0 10*3/uL (ref 0.0–0.1)
Eosinophils Absolute: 0.1 10*3/uL (ref 0.0–0.7)
Eosinophils Relative: 2.7 % (ref 0.0–5.0)
HCT: 43.8 % (ref 36.0–46.0)
HEMOGLOBIN: 14.8 g/dL (ref 12.0–15.0)
LYMPHS PCT: 39.6 % (ref 12.0–46.0)
Lymphs Abs: 2.1 10*3/uL (ref 0.7–4.0)
MCHC: 33.8 g/dL (ref 30.0–36.0)
MCV: 96.3 fl (ref 78.0–100.0)
MONOS PCT: 7.3 % (ref 3.0–12.0)
Monocytes Absolute: 0.4 10*3/uL (ref 0.1–1.0)
NEUTROS PCT: 49.8 % (ref 43.0–77.0)
Neutro Abs: 2.7 10*3/uL (ref 1.4–7.7)
Platelets: 183 10*3/uL (ref 150.0–400.0)
RBC: 4.54 Mil/uL (ref 3.87–5.11)
RDW: 14.2 % (ref 11.5–15.5)
WBC: 5.4 10*3/uL (ref 4.0–10.5)

## 2014-10-30 LAB — LIPID PANEL
CHOL/HDL RATIO: 3
CHOLESTEROL: 138 mg/dL (ref 0–200)
HDL: 49.6 mg/dL (ref >39.00)
LDL Cholesterol: 71 mg/dL (ref 0–99)
NonHDL: 88.4
TRIGLYCERIDES: 87 mg/dL (ref 0.0–149.0)
VLDL: 17.4 mg/dL (ref 0.0–40.0)

## 2014-10-30 LAB — RENAL FUNCTION PANEL
Albumin: 4.2 g/dL (ref 3.5–5.2)
BUN: 24 mg/dL — ABNORMAL HIGH (ref 6–23)
CO2: 31 mEq/L (ref 19–32)
Calcium: 9.7 mg/dL (ref 8.4–10.5)
Chloride: 102 mEq/L (ref 96–112)
Creatinine, Ser: 0.93 mg/dL (ref 0.40–1.20)
GFR: 62.3 mL/min (ref >60.00)
GLUCOSE: 99 mg/dL (ref 70–99)
POTASSIUM: 4.2 meq/L (ref 3.5–5.1)
Phosphorus: 3.3 mg/dL (ref 2.3–4.6)
SODIUM: 138 meq/L (ref 135–145)

## 2014-10-30 LAB — VITAMIN D 25 HYDROXY (VIT D DEFICIENCY, FRACTURES): VITD: 29.17 ng/mL — ABNORMAL LOW (ref 30.00–100.00)

## 2014-10-31 LAB — PARATHYROID HORMONE, INTACT (NO CA): PTH: 35 pg/mL (ref 14–64)

## 2014-11-02 DIAGNOSIS — C44612 Basal cell carcinoma of skin of right upper limb, including shoulder: Secondary | ICD-10-CM | POA: Diagnosis not present

## 2014-11-03 ENCOUNTER — Encounter: Payer: Self-pay | Admitting: Family Medicine

## 2014-11-03 ENCOUNTER — Ambulatory Visit (INDEPENDENT_AMBULATORY_CARE_PROVIDER_SITE_OTHER): Payer: Medicare Other | Admitting: Family Medicine

## 2014-11-03 VITALS — BP 108/60 | HR 62 | Temp 98.0°F | Ht 61.25 in | Wt 136.5 lb

## 2014-11-03 DIAGNOSIS — Z23 Encounter for immunization: Secondary | ICD-10-CM | POA: Diagnosis not present

## 2014-11-03 DIAGNOSIS — N183 Chronic kidney disease, stage 3 unspecified: Secondary | ICD-10-CM

## 2014-11-03 DIAGNOSIS — I10 Essential (primary) hypertension: Secondary | ICD-10-CM

## 2014-11-03 DIAGNOSIS — Z7189 Other specified counseling: Secondary | ICD-10-CM

## 2014-11-03 DIAGNOSIS — I6523 Occlusion and stenosis of bilateral carotid arteries: Secondary | ICD-10-CM

## 2014-11-03 DIAGNOSIS — E785 Hyperlipidemia, unspecified: Secondary | ICD-10-CM

## 2014-11-03 DIAGNOSIS — Z0000 Encounter for general adult medical examination without abnormal findings: Secondary | ICD-10-CM | POA: Diagnosis not present

## 2014-11-03 DIAGNOSIS — Z85820 Personal history of malignant melanoma of skin: Secondary | ICD-10-CM

## 2014-11-03 DIAGNOSIS — R160 Hepatomegaly, not elsewhere classified: Secondary | ICD-10-CM

## 2014-11-03 MED ORDER — METOPROLOL SUCCINATE ER 50 MG PO TB24: 50.0000 mg | ORAL_TABLET | Freq: Every day | ORAL | Status: DC

## 2014-11-03 MED ORDER — HYDROCHLOROTHIAZIDE 12.5 MG PO CAPS: 12.5000 mg | ORAL_CAPSULE | Freq: Every day | ORAL | Status: DC

## 2014-11-03 MED ORDER — CLOPIDOGREL BISULFATE 75 MG PO TABS: 75.0000 mg | ORAL_TABLET | Freq: Every day | ORAL | Status: DC

## 2014-11-03 MED ORDER — CELECOXIB 100 MG PO CAPS: 100.0000 mg | ORAL_CAPSULE | Freq: Every day | ORAL | Status: DC

## 2014-11-03 MED ORDER — ENALAPRIL MALEATE 5 MG PO TABS: 5.0000 mg | ORAL_TABLET | Freq: Every day | ORAL | Status: DC

## 2014-11-03 MED ORDER — ATORVASTATIN CALCIUM 40 MG PO TABS: 40.0000 mg | ORAL_TABLET | Freq: Every day | ORAL | Status: DC

## 2014-11-03 NOTE — Addendum Note (Signed)
Addended by: Tammi Sou on: 11/03/2014 09:15 AM   Modules accepted: Orders

## 2014-11-03 NOTE — Assessment & Plan Note (Signed)
Stable. Followed by surgeon.

## 2014-11-03 NOTE — Assessment & Plan Note (Signed)
Actually improved today. Will continue to monitor.

## 2014-11-03 NOTE — Assessment & Plan Note (Signed)
Preventative protocols reviewed and updated unless pt declined. Discussed healthy diet and lifestyle.  

## 2014-11-03 NOTE — Progress Notes (Signed)
BP 108/60 mmHg  Pulse 62  Temp(Src) 98 F (36.7 C) (Oral)  Ht 5' 1.25" (1.556 m)  Wt 136 lb 8 oz (61.916 kg)  BMI 25.57 kg/m2  SpO2 98%   CC: AMW  Subjective:    Patient ID: [name], female    DOB: 06-21-1940, 74 y.o.   MRN: [id number]  HPI: [name] is a 74 y.o. female presenting on 11/03/2014 for Medicare Wellness   Husband passed away last year from esophageal cancer.  Ran into screen door Monday. Wants to ensure UTD tetanus shot.   Hearing screen passed today. H/o hypertensive retinopathy. F/u scheduled Q6 mo with eye doctor. Denies falls or depression/sadness/anhedonia.   Preventative: Colonoscopy 03/2012 [name]) - polyp found, rec rpt 5 yrs.  Mammogram 05/2014 birads 1 Well woman with OBGYN Dr. Laurey Morale (last 07/2013). Watching ovarian cyst.  DEXA - told normal, done at [facility] [place] >15 yrs ago. Compliant with cal Vit D in diet and supplement.  Flu - 07/2014 Pneumovax - 10/2012. prevnar - today Shingles 2014 Td 10/2012 Advanced directives: has living will at home. Wants son and daughter [name] and [name] to be HCPOA.  Caffeine: 3 cups coffee/day Widow of husband, lives with 1 dog.  Grown children Occupation: homemaker Edu: college Activity: plays golf, walks dog, swims Diet: good water, fruits/vegetables daily  Relevant past medical, surgical, family and social history reviewed and updated as indicated. Interim medical history since our last visit reviewed. Allergies and medications reviewed and updated. Current Outpatient Prescriptions on File Prior to Visit  Medication Sig  . aspirin 81 MG tablet Take 81 mg by mouth daily.  Marland Kitchen atorvastatin (LIPITOR) 40 MG tablet Take 1 tablet (40 mg total) by mouth at bedtime.  . calcium-vitamin D (OSCAL WITH D) 500-200 MG-UNIT per tablet Take 1 tablet by mouth daily.  . celecoxib (CELEBREX) 100 MG capsule Take 1 capsule (100 mg total) by mouth daily.  . cetirizine (ZYRTEC) 10 MG tablet Take 10 mg by mouth daily.   . clopidogrel (PLAVIX) 75 MG tablet Take 1 tablet (75 mg total) by mouth daily.  . enalapril (VASOTEC) 10 MG tablet Take 10 mg by mouth 2 (two) times daily.  . hydrochlorothiazide (MICROZIDE) 12.5 MG capsule Take 1 capsule (12.5 mg total) by mouth daily.  . metoprolol succinate (TOPROL-XL) 50 MG 24 hr tablet Take 1 tablet (50 mg total) by mouth daily. Take with or immediately following a meal.  . Multiple Vitamin (MULTIVITAMIN) capsule Take 1 capsule by mouth daily.   No current facility-administered medications on file prior to visit.    Review of Systems  Constitutional: Negative for fever, chills, activity change, appetite change, fatigue and unexpected weight change.  HENT: Negative for hearing loss.   Eyes: Negative for visual disturbance.  Respiratory: Negative for cough, chest tightness, shortness of breath and wheezing.   Cardiovascular: Negative for chest pain, palpitations and leg swelling.  Gastrointestinal: Negative for nausea, vomiting, abdominal pain, diarrhea, constipation, blood in stool and abdominal distention.  Genitourinary: Negative for hematuria and difficulty urinating.  Musculoskeletal: Negative for myalgias, arthralgias and neck pain.  Skin: Negative for rash.  Neurological: Negative for dizziness, seizures, syncope and headaches.  Hematological: Negative for adenopathy. Does not bruise/bleed easily.  Psychiatric/Behavioral: Negative for dysphoric mood. The patient is not nervous/anxious.    Per HPI unless specifically indicated above     Objective:    BP 108/60 mmHg  Pulse 62  Temp(Src) 98 F (36.7 C) (Oral)  Ht 5'  1.25" (1.556 m)  Wt 136 lb 8 oz (61.916 kg)  BMI 25.57 kg/m2  SpO2 98%  Wt Readings from Last 3 Encounters:  11/03/14 136 lb 8 oz (61.916 kg)  05/24/14 136 lb (61.689 kg)  03/20/14 125 lb (56.7 kg)    Physical Exam  Constitutional: She is oriented to person, place, and time. She appears well-developed and well-nourished. No distress.    HENT:  Head: Normocephalic and atraumatic.  Right Ear: Hearing, tympanic membrane, external ear and ear canal normal.  Left Ear: Hearing, tympanic membrane, external ear and ear canal normal.  Nose: Nose normal.  Mouth/Throat: Uvula is midline, oropharynx is clear and moist and mucous membranes are normal. No oropharyngeal exudate, posterior oropharyngeal edema or posterior oropharyngeal erythema.  Eyes: Conjunctivae and EOM are normal. Pupils are equal, round, and reactive to light. No scleral icterus.  Neck: Normal range of motion. Neck supple. Carotid bruit is present (R>L). No thyromegaly present.  Cardiovascular: Normal rate, regular rhythm, normal heart sounds and intact distal pulses.   No murmur heard. Pulses:      Radial pulses are 2+ on the right side, and 2+ on the left side.  Pulmonary/Chest: Effort normal and breath sounds normal. No respiratory distress. She has no wheezes. She has no rales.  Breast - deferred per gyn  Abdominal: Soft. Normal appearance and bowel sounds are normal. She exhibits no distension and no mass. There is hepatomegaly (nontender). There is no tenderness. There is no rigidity, no rebound, no guarding, no CVA tenderness and negative Murphy's sign. No hernia.  Genitourinary:  GYN - deferred per GYN  Musculoskeletal: Normal range of motion. She exhibits no edema.  Lymphadenopathy:    She has no cervical adenopathy.  Neurological: She is alert and oriented to person, place, and time.  CN grossly intact, station and gait intact Recall 3/3 Calculation 4/5 serial 3s  Skin: Skin is warm and dry. No rash noted.  Psychiatric: She has a normal mood and affect. Her behavior is normal. Judgment and thought content normal.  Nursing note and vitals reviewed.  Results for orders placed or performed in visit on 10/30/14  CBC with Differential/Platelet  Result Value Ref Range   WBC 5.4 4.0 - 10.5 K/uL   RBC 4.54 3.87 - 5.11 Mil/uL   Hemoglobin 14.8 12.0 - 15.0  g/dL   HCT 43.8 36.0 - 46.0 %   MCV 96.3 78.0 - 100.0 fl   MCHC 33.8 30.0 - 36.0 g/dL   RDW 14.2 11.5 - 15.5 %   Platelets 183.0 150.0 - 400.0 K/uL   Neutrophils Relative % 49.8 43.0 - 77.0 %   Lymphocytes Relative 39.6 12.0 - 46.0 %   Monocytes Relative 7.3 3.0 - 12.0 %   Eosinophils Relative 2.7 0.0 - 5.0 %   Basophils Relative 0.6 0.0 - 3.0 %   Neutro Abs 2.7 1.4 - 7.7 K/uL   Lymphs Abs 2.1 0.7 - 4.0 K/uL   Monocytes Absolute 0.4 0.1 - 1.0 K/uL   Eosinophils Absolute 0.1 0.0 - 0.7 K/uL   Basophils Absolute 0.0 0.0 - 0.1 K/uL  Renal function panel  Result Value Ref Range   Sodium 138 135 - 145 mEq/L   Potassium 4.2 3.5 - 5.1 mEq/L   Chloride 102 96 - 112 mEq/L   CO2 31 19 - 32 mEq/L   Calcium 9.7 8.4 - 10.5 mg/dL   Albumin 4.2 3.5 - 5.2 g/dL   BUN 24 (H) 6 - 23 mg/dL  Creatinine, Ser 0.93 0.40 - 1.20 mg/dL   Glucose, Bld 99 70 - 99 mg/dL   Phosphorus 3.3 2.3 - 4.6 mg/dL   GFR 62.30 >60.00 mL/min  Lipid panel  Result Value Ref Range   Cholesterol 138 0 - 200 mg/dL   Triglycerides 87.0 0.0 - 149.0 mg/dL   HDL 49.60 >39.00 mg/dL   VLDL 17.4 0.0 - 40.0 mg/dL   LDL Cholesterol 71 0 - 99 mg/dL   Total CHOL/HDL Ratio 3    NonHDL 88.40   Vit D  25 hydroxy (rtn osteoporosis monitoring)  Result Value Ref Range   VITD 29.17 (L) 30.00 - 100.00 ng/mL  Parathyroid hormone, intact (no Ca)  Result Value Ref Range   PTH 35 14 - 64 pg/mL      Assessment & Plan:   Problem List Items Addressed This Visit    None       Follow up plan: No Follow-up on file.

## 2014-11-03 NOTE — Patient Instructions (Addendum)
prevnar today. Bring me copy of your advanced directive Decrease enalapril to 5mg  once daily. Monitor blood pressures and let me know if it starts creeping up. Letter for Y provided today.  Pass by Marion's office to set up abdominal ultrasound. Good to see you today, call us with questions.

## 2014-11-03 NOTE — Progress Notes (Signed)
Pre visit review using our clinic review tool, if applicable. No additional management support is needed unless otherwise documented below in the visit note. 

## 2014-11-03 NOTE — Assessment & Plan Note (Signed)
New, noted on exam today. Check abd [place], consider LFTs.

## 2014-11-03 NOTE — Assessment & Plan Note (Signed)
Chronic, great control on lipitor 40mg  daily. Continue.

## 2014-11-03 NOTE — Assessment & Plan Note (Signed)

## 2014-11-03 NOTE — Assessment & Plan Note (Signed)
Chronic, stable. Actually somewhat low and noted weight loss from significant stress over last year. Will continue decreasing ACEI - to enalapril 5mg  once daily.  Pt will notify me if bp running high.

## 2014-11-03 NOTE — Assessment & Plan Note (Signed)
Regularly seen by derm.

## 2014-11-03 NOTE — Assessment & Plan Note (Addendum)
Advanced directives: has living will at home. Wants son and daughter [name] and [name] to be HCPOA.

## 2014-11-07 ENCOUNTER — Ambulatory Visit
Admission: RE | Admit: 2014-11-07 | Discharge: 2014-11-07 | Disposition: A | Discharge status: Home / Self Care | Payer: Medicare Other | Source: Ambulatory Visit | Attending: Family Medicine | Admitting: Family Medicine

## 2014-11-07 DIAGNOSIS — N281 Cyst of kidney, acquired: Secondary | ICD-10-CM | POA: Diagnosis not present

## 2014-11-07 DIAGNOSIS — I10 Essential (primary) hypertension: Secondary | ICD-10-CM | POA: Diagnosis not present

## 2014-11-07 DIAGNOSIS — R160 Hepatomegaly, not elsewhere classified: Secondary | ICD-10-CM

## 2014-11-09 ENCOUNTER — Encounter: Payer: Self-pay | Admitting: Family Medicine

## 2014-11-14 ENCOUNTER — Encounter: Payer: Self-pay | Admitting: *Deleted

## 2015-01-02 ENCOUNTER — Encounter: Payer: Self-pay | Admitting: Interventional Cardiology

## 2015-01-19 ENCOUNTER — Telehealth: Payer: Self-pay

## 2015-01-19 NOTE — Telephone Encounter (Signed)
Pt left note requesting refills on meds to [facility]. Left detailed message per DPR that [facility]ills were [facility] on 11/03/14. Pt to ck with [facility].

## 2015-03-16 ENCOUNTER — Other Ambulatory Visit: Payer: Self-pay | Admitting: Interventional Cardiology

## 2015-03-16 DIAGNOSIS — I6523 Occlusion and stenosis of bilateral carotid arteries: Secondary | ICD-10-CM

## 2015-03-27 ENCOUNTER — Ambulatory Visit (HOSPITAL_COMMUNITY)
Admission: RE | Admit: 2015-03-27 | Discharge: 2015-03-27 | Disposition: A | Discharge status: Home / Self Care | Payer: Medicare Other | Source: Ambulatory Visit | Attending: Cardiovascular Disease | Admitting: Cardiovascular Disease

## 2015-03-27 DIAGNOSIS — I6523 Occlusion and stenosis of bilateral carotid arteries: Secondary | ICD-10-CM

## 2015-03-27 DIAGNOSIS — E785 Hyperlipidemia, unspecified: Secondary | ICD-10-CM | POA: Diagnosis not present

## 2015-03-27 DIAGNOSIS — I10 Essential (primary) hypertension: Secondary | ICD-10-CM | POA: Diagnosis not present

## 2015-03-29 ENCOUNTER — Telehealth: Payer: Self-pay | Admitting: Interventional Cardiology

## 2015-03-29 ENCOUNTER — Other Ambulatory Visit: Payer: Self-pay | Admitting: *Deleted

## 2015-03-29 DIAGNOSIS — Z1231 Encounter for screening mammogram for malignant neoplasm of breast: Secondary | ICD-10-CM

## 2015-03-29 NOTE — Telephone Encounter (Signed)
Pt is aware of the carotid duplex result.

## 2015-03-29 NOTE — Telephone Encounter (Signed)
F/u ° ° ° °Pt returning Danielle's call. °

## 2015-04-12 DIAGNOSIS — L570 Actinic keratosis: Secondary | ICD-10-CM | POA: Diagnosis not present

## 2015-04-12 DIAGNOSIS — Z85820 Personal history of malignant melanoma of skin: Secondary | ICD-10-CM | POA: Diagnosis not present

## 2015-04-12 DIAGNOSIS — D692 Other nonthrombocytopenic purpura: Secondary | ICD-10-CM | POA: Diagnosis not present

## 2015-04-12 DIAGNOSIS — D225 Melanocytic nevi of trunk: Secondary | ICD-10-CM | POA: Diagnosis not present

## 2015-04-12 DIAGNOSIS — Z85828 Personal history of other malignant neoplasm of skin: Secondary | ICD-10-CM | POA: Diagnosis not present

## 2015-04-18 ENCOUNTER — Telehealth: Payer: Self-pay

## 2015-04-18 NOTE — Telephone Encounter (Signed)
SPOKE WITH PT ABOUT FAMILY STATUS FOR CHART PREP.

## 2015-04-20 ENCOUNTER — Ambulatory Visit (INDEPENDENT_AMBULATORY_CARE_PROVIDER_SITE_OTHER): Payer: Medicare Other | Admitting: Interventional Cardiology

## 2015-04-20 ENCOUNTER — Encounter: Payer: Self-pay | Admitting: Interventional Cardiology

## 2015-04-20 VITALS — BP 140/100 | HR 67 | Ht 61.25 in | Wt 143.0 lb

## 2015-04-20 DIAGNOSIS — I10 Essential (primary) hypertension: Secondary | ICD-10-CM

## 2015-04-20 DIAGNOSIS — E785 Hyperlipidemia, unspecified: Secondary | ICD-10-CM

## 2015-04-20 DIAGNOSIS — I779 Disorder of arteries and arterioles, unspecified: Secondary | ICD-10-CM | POA: Diagnosis not present

## 2015-04-20 DIAGNOSIS — I739 Peripheral vascular disease, unspecified: Principal | ICD-10-CM

## 2015-04-20 NOTE — Patient Instructions (Signed)
Dr Irish Lack has recommended making the following medication changes: STOP Aspirin CONTINUE Plavix  Dr Irish Lack recommends that you schedule a follow-up appointment in 1 year. You will receive a reminder letter in the mail two months in advance. If you don't receive a letter, please call our office to schedule the follow-up appointment.  If you need a refill on your cardiac medications before your next appointment, please call your pharmacy.

## 2015-04-20 NOTE — Progress Notes (Signed)
Patient ID: [name], female   DOB: 1940/08/02, 74 y.o.   MRN: [id number]     Cardiology Office Note   Date:  04/20/2015   ID:  [name], DOB November 29, 1940, MRN [id number]  PCP:  Ria Bush, MD    No chief complaint on file. f/u carotid artery disease   Wt Readings from Last 3 Encounters:  04/20/15 143 lb (64.864 kg)  11/03/14 136 lb 8 oz (61.916 kg)  05/24/14 136 lb (61.689 kg)       History of Present Illness: [name] is a 74 y.o. female  who has had a right carotid stent after restenosis post CEA. She saw Dr. Edd Arbour at [facility]. She had been following with him for a while but came to me for local care. SHe has not had heart probelms. She has had a stress test many years ago which was negative. She had a left carotid stent done in 1/14.  Hypertension:  No focal weakness.  Silver sneakers program for exercise.  SHe plays golf as well.  Goes to gym daily. Denies : Chest pain.  Dizziness.  Leg edema.  Orthopnea.  Paroxysmal nocturnal dyspnea.  Palpitations.  Shortness of breath.  Blurry vision.  Syncope.   No focal weakness.  Her husband passed away in February 04, 2023. She has been losing weight. She is eating well. Daughter lives in [place], and sees my sister in law for a PMD.   No recent lightheadedness or syncope.   The patient has joined the [employer]. She walks there and does some exercise classes-hip issues have resolved.     Past Medical History  Diagnosis Date  . HTN (hypertension) 1970s  . HLD (hyperlipidemia)   . History of colon polyps   . Bilateral carotid artery stenosis     R patent, L 80-99% s/p stent placed [facility], Dr. Edd Arbour)  . Fibrocystic breast disease     followed by surgery Jamal Collin)  . Arthritis     hands, knees, ankles  . History of melanoma 1990s    right shoulder  . History of chicken pox   . GERD (gastroesophageal reflux disease)     OTC prilosec  . Hernia 2005  . Hypertensive retinopathy 2015    stage 1 (Bulakowski)  . BCC  (basal cell carcinoma of skin) 2016    Stinehelfer  . Kidney cyst, acquired 2016    large R septated 8cm    Past Surgical History  Procedure Laterality Date  . Breast biopsy  1998    left  . Appendectomy  1995  . Total hip arthroplasty  2005    bilat  . Carotid stent Right 2002    right after restenosis post CEA (Dr. Edd Arbour)  . Cesarean section  1968; 1972  . Melanoma excision  1998    right shoulder  . Exploratory laparotomy  1990s    for GI pain, inflamed lower GI, unrevealing workup, improved with abx  . Colonoscopy  03/2012    Sankar - polyp (rec rpt 5 yrs)  . Cardiovascular stress test  ~2003    WNL  . Carotid endarterectomy  2002    bilateral  . Abi  2007    R 1.19, L 1.2  . Carotid stent  2014    for stenosis  . Hernia repair  2005    abdomen  . Carotid stent Left 2014    Dr. Ardeth Sportsman, [facility]     Current Outpatient Prescriptions  Medication Sig Dispense Refill  .  aspirin 81 MG tablet Take 81 mg by mouth daily.    Marland Kitchen atorvastatin (LIPITOR) 40 MG tablet Take 1 tablet (40 mg total) by mouth at bedtime. 90 tablet 3  . calcium-vitamin D (OSCAL WITH D) 500-200 MG-UNIT per tablet Take 1 tablet by mouth daily.    . celecoxib (CELEBREX) 100 MG capsule Take 1 capsule (100 mg total) by mouth daily. 90 capsule 3  . clopidogrel (PLAVIX) 75 MG tablet Take 1 tablet (75 mg total) by mouth daily. 90 tablet 3  . enalapril (VASOTEC) 5 MG tablet Take 1 tablet (5 mg total) by mouth daily. 90 tablet 3  . fluticasone (FLONASE) 50 MCG/ACT nasal spray Place 2 sprays into both nostrils daily as needed for allergies or rhinitis.    Marland Kitchen FLUZONE HIGH-DOSE 0.5 ML SUSY TO BE ADMINISTERED BY PHARMACIST FOR IMMUNIZATION  0  . hydrochlorothiazide (MICROZIDE) 12.5 MG capsule Take 1 capsule (12.5 mg total) by mouth daily. 90 capsule 3  . metoprolol succinate (TOPROL-XL) 50 MG 24 hr tablet Take 1 tablet (50 mg total) by mouth daily. Take with or immediately following a meal. 90 tablet 3  . Multiple  Vitamin (MULTIVITAMIN) capsule Take 1 capsule by mouth daily.    Marland Kitchen triamcinolone cream (KENALOG) 0.1 % 1 APPLICATION APPLY ON THE SKIN AS DIRECTED APPLY TO AFFECTED AREAS IN EARS DAILY AS NEEDED.  0   No current facility-administered medications for this visit.    Allergies:   Review of patient's allergies indicates no known allergies.    Social History:  The patient  reports that she has quit smoking. She has never used smokeless tobacco. She reports that she does not drink alcohol or use illicit drugs.   Family History:  The patient's *family history includes Cancer (age of onset: 66) in her father; Cancer (age of onset: 77) in her mother; Hypertension in her paternal grandfather. There is no history of CAD, Stroke, or Diabetes.    ROS:  Please see the history of present illness.   Otherwise, review of systems are positive for high BP today; usually normal range.   All other systems are reviewed and negative.    PHYSICAL EXAM: VS:  BP 140/100 mmHg  Pulse 67  Ht 5' 1.25" (1.556 m)  Wt 143 lb (64.864 kg)  BMI 26.79 kg/m2 , BMI Body mass index is 26.79 kg/(m^2). GEN: Well nourished, well developed, in no acute distress HEENT: normal Neck: no JVD, carotid bruits, or masses Cardiac: RRR; no murmurs, rubs, or gallops,no edema  Respiratory:  clear to auscultation bilaterally, normal work of breathing GI: soft, nontender, nondistended, + BS MS: no deformity or atrophy Skin: warm and dry, no rash Neuro:  Strength and sensation are intact Psych: euthymic mood, full affect   EKG:   The ekg ordered today demonstrates 6/16: no AA; aortic atherosclerosis present   Recent Labs: 10/30/2014: BUN 24*; Creatinine, Ser 0.93; Hemoglobin 14.8; Platelets 183.0; Potassium 4.2; Sodium 138   Lipid Panel    Component Value Date/Time   CHOL 138 10/30/2014 0921   TRIG 87.0 10/30/2014 0921   TRIG 144 03/28/2011   HDL 49.60 10/30/2014 0921   CHOLHDL 3 10/30/2014 0921   VLDL 17.4 10/30/2014 0921     LDLCALC 71 10/30/2014 0921   LDLDIRECT 78 03/28/2011     Other studies Reviewed: Additional studies/ records that were reviewed today with results demonstrating: .   ASSESSMENT AND PLAN:  1. Carotid artery disease:  Stop aspirin. Continue Plavix.  COntinue regular carotid  Dopplers. Study from November 2016 was reviewed and showed only moderate disease bilaterally.  2. Hyperlipidemia: LDL controlled. Continue lipid-lowering therapy. 3. Hypertension: Blood pressures typically well controlled at home. Continue current antihypertensive therapy.   Current medicines are reviewed at length with the patient today.  The patient concerns regarding her medicines were addressed.  The following changes have been made:  No change  Labs/ tests ordered today include:  No orders of the defined types were placed in this encounter.    Recommend 150 minutes/week of aerobic exercise Low fat, low carb, high fiber diet recommended  Disposition:   FU in 1 yr   Teresita Madura., MD  04/20/2015 9:23 AM    [facility] [place], [place], [place]  [address] Phone: [phone]; Fax: [phone]

## 2015-05-13 DIAGNOSIS — H35033 Hypertensive retinopathy, bilateral: Secondary | ICD-10-CM

## 2015-05-13 DIAGNOSIS — D039 Melanoma in situ, unspecified: Secondary | ICD-10-CM

## 2015-05-13 HISTORY — DX: Melanoma in situ, unspecified: D03.9

## 2015-05-13 HISTORY — DX: Hypertensive retinopathy, bilateral: H35.033

## 2015-05-21 ENCOUNTER — Telehealth: Payer: Self-pay | Admitting: Family Medicine

## 2015-05-21 ENCOUNTER — Ambulatory Visit: Payer: Medicare Other

## 2015-05-21 DIAGNOSIS — N281 Cyst of kidney, acquired: Secondary | ICD-10-CM

## 2015-05-21 DIAGNOSIS — N183 Chronic kidney disease, stage 3 unspecified: Secondary | ICD-10-CM

## 2015-05-21 NOTE — Telephone Encounter (Signed)
Due for rpt renal US for R septated cyst. Ordered.

## 2015-05-21 NOTE — Telephone Encounter (Signed)
-----   Message from Ria Bush, MD sent at 11/12/2014 11:55 AM EDT ----- Rpt renal US 6 mo

## 2015-05-23 ENCOUNTER — Ambulatory Visit
Admission: RE | Admit: 2015-05-23 | Discharge: 2015-05-23 | Disposition: A | Discharge status: Home / Self Care | Payer: Medicare Other | Source: Ambulatory Visit | Attending: General Surgery | Admitting: General Surgery

## 2015-05-23 ENCOUNTER — Other Ambulatory Visit: Payer: Self-pay | Admitting: General Surgery

## 2015-05-23 DIAGNOSIS — Z1231 Encounter for screening mammogram for malignant neoplasm of breast: Secondary | ICD-10-CM

## 2015-05-24 NOTE — Telephone Encounter (Signed)
Appt made at [facility], patient aware.

## 2015-05-28 ENCOUNTER — Ambulatory Visit (INDEPENDENT_AMBULATORY_CARE_PROVIDER_SITE_OTHER): Payer: Medicare Other | Admitting: General Surgery

## 2015-05-28 ENCOUNTER — Encounter: Payer: Self-pay | Admitting: General Surgery

## 2015-05-28 VITALS — BP 118/78 | HR 72 | Resp 14 | Ht 62.0 in | Wt 147.0 lb

## 2015-05-28 DIAGNOSIS — K432 Incisional hernia without obstruction or gangrene: Secondary | ICD-10-CM | POA: Diagnosis not present

## 2015-05-28 DIAGNOSIS — Z85820 Personal history of malignant melanoma of skin: Secondary | ICD-10-CM

## 2015-05-28 DIAGNOSIS — N6019 Diffuse cystic mastopathy of unspecified breast: Secondary | ICD-10-CM | POA: Diagnosis not present

## 2015-05-28 DIAGNOSIS — Z1231 Encounter for screening mammogram for malignant neoplasm of breast: Secondary | ICD-10-CM | POA: Diagnosis not present

## 2015-05-28 NOTE — Patient Instructions (Signed)
Return in one year with bilateral screening mammograms. Call after ultrasound done.

## 2015-05-28 NOTE — Progress Notes (Signed)
Patient ID: [name], female   DOB: January 24, 1941, 75 y.o.   MRN: [id number]  Chief Complaint  Patient presents with  . Follow-up    Mammomgram     HPI [name] is a 75 y.o. female who presents for a breast evaluation. The most recent mammogram was done on 05/23/15.  Patient does perform regular self breast checks and gets regular mammograms done. No new changes in her breast.  I have reviewed the history of present illness with the patient. HPI  Past Medical History  Diagnosis Date  . HTN (hypertension) 1970s  . HLD (hyperlipidemia)   . History of colon polyps   . Bilateral carotid artery stenosis     R patent, L 80-99% s/p stent placed [facility], Dr. Edd Arbour)  . Fibrocystic breast disease     followed by surgery Jamal Collin)  . Arthritis     hands, knees, ankles  . History of melanoma 1990s    right shoulder  . History of chicken pox   . GERD (gastroesophageal reflux disease)     OTC prilosec  . Hernia 2005  . Hypertensive retinopathy 2015    stage 1 (Bulakowski)  . BCC (basal cell carcinoma of skin) 2016    Stinehelfer  . Kidney cyst, acquired 2016    large R septated 8cm    Past Surgical History  Procedure Laterality Date  . Appendectomy  1995  . Total hip arthroplasty  2005    bilat  . Carotid stent Right 2002    right after restenosis post CEA (Dr. Edd Arbour)  . Cesarean section  1968; 1972  . Melanoma excision  1998    right shoulder  . Exploratory laparotomy  1990s    for GI pain, inflamed lower GI, unrevealing workup, improved with abx  . Colonoscopy  03/2012    Myson Levi - polyp (rec rpt 5 yrs)  . Cardiovascular stress test  ~2003    WNL  . Carotid endarterectomy  2002    bilateral  . Abi  2007    R 1.19, L 1.2  . Carotid stent  2014    for stenosis  . Hernia repair  2005    abdomen  . Carotid stent Left 2014    Dr. Ardeth Sportsman, [facility]  . Breast biopsy Left 1997    left  . Breast excisional biopsy Right 1998    right  . Breast cyst aspiration Left  02/1997    fna    Family History  Problem Relation Age of Onset  . Cancer Father 42    prostate  . Cancer Mother 83    lung, smoker  . Hypertension Paternal Grandfather   . CAD Neg Hx   . Stroke Neg Hx   . Diabetes Neg Hx     Social History Social History  Substance Use Topics  . Smoking status: Former Smoker -- 1.00 packs/day for 30 years  . Smokeless tobacco: Never Used  . Alcohol Use: 0.0 oz/week    0 Standard drinks or equivalent per week     Comment: 1 glass wine daily    No Known Allergies  Current Outpatient Prescriptions  Medication Sig Dispense Refill  . atorvastatin (LIPITOR) 40 MG tablet Take 1 tablet (40 mg total) by mouth at bedtime. 90 tablet 3  . calcium-vitamin D (OSCAL WITH D) 500-200 MG-UNIT per tablet Take 1 tablet by mouth daily.    . celecoxib (CELEBREX) 100 MG capsule Take 1 capsule (100 mg total) by mouth  daily. 90 capsule 3  . clopidogrel (PLAVIX) 75 MG tablet Take 1 tablet (75 mg total) by mouth daily. 90 tablet 3  . enalapril (VASOTEC) 5 MG tablet Take 1 tablet (5 mg total) by mouth daily. 90 tablet 3  . fluticasone (FLONASE) 50 MCG/ACT nasal spray Place 2 sprays into both nostrils daily as needed for allergies or rhinitis.    Marland Kitchen FLUZONE HIGH-DOSE 0.5 ML SUSY TO BE ADMINISTERED BY PHARMACIST FOR IMMUNIZATION  0  . hydrochlorothiazide (MICROZIDE) 12.5 MG capsule Take 1 capsule (12.5 mg total) by mouth daily. 90 capsule 3  . metoprolol succinate (TOPROL-XL) 50 MG 24 hr tablet Take 1 tablet (50 mg total) by mouth daily. Take with or immediately following a meal. 90 tablet 3  . Multiple Vitamin (MULTIVITAMIN) capsule Take 1 capsule by mouth daily.    Marland Kitchen triamcinolone cream (KENALOG) 0.1 % 1 APPLICATION APPLY ON THE SKIN AS DIRECTED APPLY TO AFFECTED AREAS IN EARS DAILY AS NEEDED.  0   No current facility-administered medications for this visit.    Review of Systems Review of Systems  Constitutional: Negative.   Respiratory: Negative.     Cardiovascular: Negative.     Blood pressure 118/78, pulse 72, resp. rate 14, height 5\' 2"  (1.575 m), weight 147 lb (66.679 kg).  Physical Exam Physical Exam  Constitutional: She is oriented to person, place, and time. She appears well-developed and well-nourished.  Eyes: Conjunctivae are normal. No scleral icterus.  Neck: Neck supple. No thyromegaly present.  Cardiovascular: Normal rate, regular rhythm and normal heart sounds.   Pulmonary/Chest: Effort normal and breath sounds normal. Right breast exhibits no inverted nipple, no mass, no nipple discharge, no skin change and no tenderness. Left breast exhibits no inverted nipple, no mass, no nipple discharge, no skin change and no tenderness.  Abdominal: Soft. Bowel sounds are normal. There is no hepatomegaly. A hernia (incisional hernia epigastric region. This is unchenged from last yr.) is present.    Lymphadenopathy:    She has no cervical adenopathy.    She has no axillary adenopathy.  Neurological: She is alert and oriented to person, place, and time.  Skin: Skin is warm and dry.    Data Reviewed Mammogram reviewed and stable Recent notes from Dr. Pollyann Kennedy PCP Assessment    Fibrocystic disease. History of melanoma right shoulder. Incisional hernia remains unchanged. In June of last yr she had an US showing an 8cm cyst in right kidney. Likely this cyst accounts for the palpable mass in right abdomen. Pt has scheduled f/u US later this month    Plan      One year follow up with bilateral screening mammogram. Follow up US ordered by PCP, scheduled for 06/05/15.    This information has been scribed by Verlene Mayer, CMA   PCP: Orlie Dakin, MD       [name] 05/28/2015, 1:59 PM

## 2015-06-05 ENCOUNTER — Ambulatory Visit
Admission: RE | Admit: 2015-06-05 | Discharge: 2015-06-05 | Disposition: A | Discharge status: Home / Self Care | Payer: Medicare Other | Source: Ambulatory Visit | Attending: Family Medicine | Admitting: Family Medicine

## 2015-06-05 ENCOUNTER — Telehealth: Payer: Self-pay | Admitting: General Surgery

## 2015-06-05 DIAGNOSIS — N183 Chronic kidney disease, stage 3 unspecified: Secondary | ICD-10-CM

## 2015-06-05 DIAGNOSIS — N281 Cyst of kidney, acquired: Secondary | ICD-10-CM | POA: Diagnosis not present

## 2015-06-05 NOTE — Telephone Encounter (Addendum)
PT CAME INTO REPORT SHE HAD COMPLETED HER ABD/PELVIC U/S  DONE TODAY 06-05-15 @ [address] ORDERED BY HER PCP. STATES THE REPORT WILL BE READY TOMORROW 06-06-15. IF PT HASN'T HEARD BACK BY Thursday 06-07-15,SHE WILL CALL BACK AROUND 1:00PM/MTH

## 2015-06-08 ENCOUNTER — Encounter: Payer: Self-pay | Admitting: Family Medicine

## 2015-07-17 DIAGNOSIS — H5203 Hypermetropia, bilateral: Secondary | ICD-10-CM | POA: Diagnosis not present

## 2015-07-17 DIAGNOSIS — I10 Essential (primary) hypertension: Secondary | ICD-10-CM | POA: Diagnosis not present

## 2015-07-17 DIAGNOSIS — H25813 Combined forms of age-related cataract, bilateral: Secondary | ICD-10-CM | POA: Diagnosis not present

## 2015-07-17 DIAGNOSIS — H35033 Hypertensive retinopathy, bilateral: Secondary | ICD-10-CM | POA: Diagnosis not present

## 2015-07-17 DIAGNOSIS — H524 Presbyopia: Secondary | ICD-10-CM | POA: Diagnosis not present

## 2015-08-20 ENCOUNTER — Encounter: Payer: Self-pay | Admitting: Family Medicine

## 2015-08-31 ENCOUNTER — Other Ambulatory Visit: Payer: Self-pay | Admitting: Family Medicine

## 2015-10-11 DIAGNOSIS — C44519 Basal cell carcinoma of skin of other part of trunk: Secondary | ICD-10-CM | POA: Diagnosis not present

## 2015-10-11 DIAGNOSIS — Z85828 Personal history of other malignant neoplasm of skin: Secondary | ICD-10-CM | POA: Diagnosis not present

## 2015-10-11 DIAGNOSIS — L821 Other seborrheic keratosis: Secondary | ICD-10-CM | POA: Diagnosis not present

## 2015-10-11 DIAGNOSIS — L820 Inflamed seborrheic keratosis: Secondary | ICD-10-CM | POA: Diagnosis not present

## 2015-10-11 DIAGNOSIS — D0361 Melanoma in situ of right upper limb, including shoulder: Secondary | ICD-10-CM | POA: Diagnosis not present

## 2015-10-11 DIAGNOSIS — L570 Actinic keratosis: Secondary | ICD-10-CM | POA: Diagnosis not present

## 2015-10-11 DIAGNOSIS — Z85820 Personal history of malignant melanoma of skin: Secondary | ICD-10-CM | POA: Diagnosis not present

## 2015-10-18 ENCOUNTER — Encounter: Payer: Self-pay | Admitting: Family Medicine

## 2015-10-18 DIAGNOSIS — D039 Melanoma in situ, unspecified: Secondary | ICD-10-CM | POA: Insufficient documentation

## 2015-10-18 DIAGNOSIS — C44519 Basal cell carcinoma of skin of other part of trunk: Secondary | ICD-10-CM | POA: Diagnosis not present

## 2015-10-20 ENCOUNTER — Other Ambulatory Visit: Payer: Self-pay | Admitting: Family Medicine

## 2015-10-24 ENCOUNTER — Encounter: Payer: Self-pay | Admitting: Family Medicine

## 2015-10-30 DIAGNOSIS — D0361 Melanoma in situ of right upper limb, including shoulder: Secondary | ICD-10-CM | POA: Diagnosis not present

## 2015-11-02 ENCOUNTER — Other Ambulatory Visit: Payer: Medicare Other

## 2015-11-06 ENCOUNTER — Encounter: Payer: Medicare Other | Admitting: Family Medicine

## 2015-12-23 ENCOUNTER — Other Ambulatory Visit: Payer: Self-pay | Admitting: Family Medicine

## 2015-12-23 DIAGNOSIS — N183 Chronic kidney disease, stage 3 unspecified: Secondary | ICD-10-CM

## 2015-12-23 DIAGNOSIS — I10 Essential (primary) hypertension: Secondary | ICD-10-CM

## 2015-12-23 DIAGNOSIS — E785 Hyperlipidemia, unspecified: Secondary | ICD-10-CM

## 2015-12-24 ENCOUNTER — Other Ambulatory Visit (INDEPENDENT_AMBULATORY_CARE_PROVIDER_SITE_OTHER): Payer: Medicare Other

## 2015-12-24 DIAGNOSIS — N183 Chronic kidney disease, stage 3 unspecified: Secondary | ICD-10-CM

## 2015-12-24 DIAGNOSIS — I10 Essential (primary) hypertension: Secondary | ICD-10-CM | POA: Diagnosis not present

## 2015-12-24 DIAGNOSIS — E785 Hyperlipidemia, unspecified: Secondary | ICD-10-CM

## 2015-12-24 LAB — CBC WITH DIFFERENTIAL/PLATELET
BASOS ABS: 0.1 10*3/uL (ref 0.0–0.1)
BASOS PCT: 0.9 % (ref 0.0–3.0)
EOS ABS: 0.2 10*3/uL (ref 0.0–0.7)
Eosinophils Relative: 2.8 % (ref 0.0–5.0)
HEMATOCRIT: 45.8 % (ref 36.0–46.0)
Hemoglobin: 15.8 g/dL — ABNORMAL HIGH (ref 12.0–15.0)
LYMPHS ABS: 2.4 10*3/uL (ref 0.7–4.0)
Lymphocytes Relative: 36.8 % (ref 12.0–46.0)
MCHC: 34.4 g/dL (ref 30.0–36.0)
MCV: 96.1 fl (ref 78.0–100.0)
MONOS PCT: 7.6 % (ref 3.0–12.0)
Monocytes Absolute: 0.5 10*3/uL (ref 0.1–1.0)
NEUTROS ABS: 3.4 10*3/uL (ref 1.4–7.7)
NEUTROS PCT: 51.9 % (ref 43.0–77.0)
PLATELETS: 204 10*3/uL (ref 150.0–400.0)
RBC: 4.77 Mil/uL (ref 3.87–5.11)
RDW: 14.5 % (ref 11.5–15.5)
WBC: 6.5 10*3/uL (ref 4.0–10.5)

## 2015-12-24 LAB — BASIC METABOLIC PANEL
BUN: 21 mg/dL (ref 6–23)
CO2: 29 meq/L (ref 19–32)
Calcium: 9.9 mg/dL (ref 8.4–10.5)
Chloride: 103 mEq/L (ref 96–112)
Creatinine, Ser: 1.06 mg/dL (ref 0.40–1.20)
GFR: 53.41 mL/min — ABNORMAL LOW (ref >60.00)
GLUCOSE: 104 mg/dL — AB (ref 70–99)
POTASSIUM: 4.6 meq/L (ref 3.5–5.1)
SODIUM: 140 meq/L (ref 135–145)

## 2015-12-24 LAB — LIPID PANEL
CHOL/HDL RATIO: 3
CHOLESTEROL: 129 mg/dL (ref 0–200)
HDL: 45.3 mg/dL (ref >39.00)
LDL CALC: 62 mg/dL (ref 0–99)
NonHDL: 83.33
TRIGLYCERIDES: 109 mg/dL (ref 0.0–149.0)
VLDL: 21.8 mg/dL (ref 0.0–40.0)

## 2015-12-24 LAB — VITAMIN D 25 HYDROXY (VIT D DEFICIENCY, FRACTURES): VITD: 29.93 ng/mL — ABNORMAL LOW (ref 30.00–100.00)

## 2015-12-31 ENCOUNTER — Encounter: Payer: Self-pay | Admitting: Family Medicine

## 2015-12-31 ENCOUNTER — Ambulatory Visit (INDEPENDENT_AMBULATORY_CARE_PROVIDER_SITE_OTHER): Payer: Medicare Other | Admitting: Family Medicine

## 2015-12-31 VITALS — BP 114/78 | HR 66 | Temp 98.0°F | Ht 61.5 in | Wt 146.5 lb

## 2015-12-31 DIAGNOSIS — N183 Chronic kidney disease, stage 3 unspecified: Secondary | ICD-10-CM

## 2015-12-31 DIAGNOSIS — I480 Paroxysmal atrial fibrillation: Secondary | ICD-10-CM | POA: Diagnosis not present

## 2015-12-31 DIAGNOSIS — Z7189 Other specified counseling: Secondary | ICD-10-CM | POA: Diagnosis not present

## 2015-12-31 DIAGNOSIS — N281 Cyst of kidney, acquired: Secondary | ICD-10-CM

## 2015-12-31 DIAGNOSIS — Z0000 Encounter for general adult medical examination without abnormal findings: Secondary | ICD-10-CM

## 2015-12-31 DIAGNOSIS — I779 Disorder of arteries and arterioles, unspecified: Secondary | ICD-10-CM

## 2015-12-31 DIAGNOSIS — I739 Peripheral vascular disease, unspecified: Secondary | ICD-10-CM

## 2015-12-31 DIAGNOSIS — I4891 Unspecified atrial fibrillation: Secondary | ICD-10-CM | POA: Insufficient documentation

## 2015-12-31 DIAGNOSIS — D0360 Melanoma in situ of unspecified upper limb, including shoulder: Secondary | ICD-10-CM

## 2015-12-31 MED ORDER — HYDROCHLOROTHIAZIDE 12.5 MG PO CAPS: 12.5000 mg | ORAL_CAPSULE | Freq: Every day | ORAL | 3 refills | Status: DC

## 2015-12-31 MED ORDER — CLOPIDOGREL BISULFATE 75 MG PO TABS: 75.0000 mg | ORAL_TABLET | Freq: Every day | ORAL | 3 refills | Status: DC

## 2015-12-31 MED ORDER — METOPROLOL SUCCINATE ER 50 MG PO TB24: ORAL_TABLET | ORAL | 3 refills | Status: DC

## 2015-12-31 MED ORDER — ATORVASTATIN CALCIUM 40 MG PO TABS: 40.0000 mg | ORAL_TABLET | Freq: Every day | ORAL | 3 refills | Status: DC

## 2015-12-31 MED ORDER — ENALAPRIL MALEATE 5 MG PO TABS: 5.0000 mg | ORAL_TABLET | Freq: Every day | ORAL | 3 refills | Status: DC

## 2015-12-31 MED ORDER — CELECOXIB 100 MG PO CAPS: 100.0000 mg | ORAL_CAPSULE | Freq: Every day | ORAL | 3 refills | Status: DC

## 2015-12-31 NOTE — Progress Notes (Signed)
BP 114/78   Pulse 66   Temp 98 F (36.7 C) (Oral)   Ht 5' 1.5" (1.562 m)   Wt 146 lb 8 oz (66.5 kg)   BMI 27.23 kg/m    CC: medicare wellness visit Subjective:    Patient ID: [name], female    DOB: 08/07/1940, 75 y.o.   MRN: [id number]  HPI: LAFAYE DENNINGER is a 75 y.o. female presenting on 12/31/2015 for Annual Exam   Recent prolonged trip with grandchildren on cruise and to [place].   Denies palpitations.  Husband passed away 2013-08-13 from esophageal cancer. H/o R shoulder melanoma.  Known 8cm R kidney cyst with palpable R abd mass.  Carotid stenosis monitoring yearly with carotid US. Only on plavix, aspirin stopped.   Hearing screen passed today.  Vision screen passed today. H/o hypertensive retinopathy. F/u scheduled Q6 mo with eye doctor. Denies falls or depression/sadness/anhedonia.   Preventative: Colonoscopy 03/2012 Jamal Collin) - polyp found, rec rpt 5 yrs.  Mammogram 05/2015 birads 1. H/o fibrocystic breast disease.  Well woman with OBGYN Dr. Laurey Morale (last 07/2013). Watching ovarian cyst.  DEXA - told normal, done at [facility], [place] >15 yrs ago. Compliant with cal Vit D in diet and supplement.  Flu - yearly Pneumovax - 10/2012. prevnar - 10/2014 Td 10/2012 Shingles 13-Aug-2012 Advanced directives: Received, scanned durable power of attorney form (10/2014). Son and daughter are joint HCPOA's. Does not want prolonged life support if terminal condition.  Seat belt use discussed Sunscreen use discussed. Sees derm regularly for h/o melanoma.  Caffeine: 3 cups coffee/day Widow of husband, lives with 1 dog.  Grown children Occupation: homemaker Edu: college Activity: plays golf, walks dog, swims Diet: good water, fruits/vegetables daily   Relevant past medical, surgical, family and social history reviewed and updated as indicated. Interim medical history since our last visit reviewed. Allergies and medications reviewed and updated. Current Outpatient Prescriptions on File  Prior to Visit  Medication Sig  . calcium-vitamin D (OSCAL WITH D) 500-200 MG-UNIT per tablet Take 1 tablet by mouth daily.  . fluticasone (FLONASE) 50 MCG/ACT nasal spray Place 2 sprays into both nostrils daily as needed for allergies or rhinitis.  . Multiple Vitamin (MULTIVITAMIN) capsule Take 1 capsule by mouth daily.  Marland Kitchen triamcinolone cream (KENALOG) 0.1 % 1 APPLICATION APPLY ON THE SKIN AS DIRECTED APPLY TO AFFECTED AREAS IN EARS DAILY AS NEEDED.   No current facility-administered medications on file prior to visit.     Review of Systems  Constitutional: Negative for activity change, appetite change, chills, fatigue, fever and unexpected weight change.  HENT: Negative for hearing loss.   Eyes: Negative for visual disturbance.  Respiratory: Negative for cough, chest tightness, shortness of breath and wheezing.   Cardiovascular: Negative for chest pain, palpitations and leg swelling.  Gastrointestinal: Negative for abdominal distention, abdominal pain, blood in stool, constipation, diarrhea, nausea and vomiting.  Genitourinary: Negative for difficulty urinating and hematuria.  Musculoskeletal: Negative for arthralgias, myalgias and neck pain.  Skin: Negative for rash.  Neurological: Negative for dizziness, seizures, syncope and headaches.  Hematological: Negative for adenopathy. Does not bruise/bleed easily.  Psychiatric/Behavioral: Negative for dysphoric mood. The patient is not nervous/anxious.    Per HPI unless specifically indicated in ROS section     Objective:    BP 114/78   Pulse 66   Temp 98 F (36.7 C) (Oral)   Ht 5' 1.5" (1.562 m)   Wt 146 lb 8 oz (66.5 kg)   BMI  27.23 kg/m   Wt Readings from Last 3 Encounters:  12/31/15 146 lb 8 oz (66.5 kg)  05/28/15 147 lb (66.7 kg)  04/20/15 143 lb (64.9 kg)    Physical Exam  Constitutional: She is oriented to person, place, and time. She appears well-developed and well-nourished. No distress.  HENT:  Head: Normocephalic  and atraumatic.  Right Ear: Hearing, tympanic membrane, external ear and ear canal normal.  Left Ear: Hearing, tympanic membrane, external ear and ear canal normal.  Nose: Nose normal.  Mouth/Throat: Uvula is midline, oropharynx is clear and moist and mucous membranes are normal. No oropharyngeal exudate, posterior oropharyngeal edema or posterior oropharyngeal erythema.  Eyes: Conjunctivae and EOM are normal. Pupils are equal, round, and reactive to light. No scleral icterus.  Neck: Normal range of motion. Neck supple. Carotid bruit is not present. No thyromegaly present.  Cardiovascular: Normal heart sounds and intact distal pulses.  An irregularly irregular rhythm present. Tachycardia present.   No murmur heard. Pulses:      Radial pulses are 2+ on the right side, and 2+ on the left side.  Pulmonary/Chest: Effort normal and breath sounds normal. No respiratory distress. She has no wheezes. She has no rales.  Breast exam deferred per Dr Jamal Collin  Abdominal: Soft. Bowel sounds are normal. She exhibits mass (r abdominal mass). She exhibits no distension. There is no tenderness. There is no rebound and no guarding.  Musculoskeletal: Normal range of motion. She exhibits no edema.  Lymphadenopathy:    She has no cervical adenopathy.  Neurological: She is alert and oriented to person, place, and time.  CN grossly intact, station and gait intact Recall 3/3 Calculation 5/5 D-L-R-O-W, 2/5 serial 3s  Skin: Skin is warm and dry. No rash noted.  Psychiatric: She has a normal mood and affect. Her behavior is normal. Judgment and thought content normal.  Nursing note and vitals reviewed.  Results for orders placed or performed in visit on 12/24/15  Lipid panel  Result Value Ref Range   Cholesterol 129 0 - 200 mg/dL   Triglycerides 109.0 0.0 - 149.0 mg/dL   HDL 45.30 >39.00 mg/dL   VLDL 21.8 0.0 - 40.0 mg/dL   LDL Cholesterol 62 0 - 99 mg/dL   Total CHOL/HDL Ratio 3    NonHDL 83.33   CBC with  Differential/Platelet  Result Value Ref Range   WBC 6.5 4.0 - 10.5 K/uL   RBC 4.77 3.87 - 5.11 Mil/uL   Hemoglobin 15.8 (H) 12.0 - 15.0 g/dL   HCT 45.8 36.0 - 46.0 %   MCV 96.1 78.0 - 100.0 fl   MCHC 34.4 30.0 - 36.0 g/dL   RDW 14.5 11.5 - 15.5 %   Platelets 204.0 150.0 - 400.0 K/uL   Neutrophils Relative % 51.9 43.0 - 77.0 %   Lymphocytes Relative 36.8 12.0 - 46.0 %   Monocytes Relative 7.6 3.0 - 12.0 %   Eosinophils Relative 2.8 0.0 - 5.0 %   Basophils Relative 0.9 0.0 - 3.0 %   Neutro Abs 3.4 1.4 - 7.7 K/uL   Lymphs Abs 2.4 0.7 - 4.0 K/uL   Monocytes Absolute 0.5 0.1 - 1.0 K/uL   Eosinophils Absolute 0.2 0.0 - 0.7 K/uL   Basophils Absolute 0.1 0.0 - 0.1 K/uL  Basic metabolic panel  Result Value Ref Range   Sodium 140 135 - 145 mEq/L   Potassium 4.6 3.5 - 5.1 mEq/L   Chloride 103 96 - 112 mEq/L   CO2 29 19 -  32 mEq/L   Glucose, Bld 104 (H) 70 - 99 mg/dL   BUN 21 6 - 23 mg/dL   Creatinine, Ser 1.06 0.40 - 1.20 mg/dL   Calcium 9.9 8.4 - 10.5 mg/dL   GFR 53.41 (L) >60.00 mL/min  VITAMIN D 25 Hydroxy (Vit-D Deficiency, Fractures)  Result Value Ref Range   VITD 29.93 (L) 30.00 - 100.00 ng/mL      Assessment & Plan:   Problem List Items Addressed This Visit    Advanced care planning/counseling discussion    Received, scanned durable power of attorney form (10/2014). Son and daughter are joint HCPOA's. Does not want prolonged life support if terminal condition.       Bilateral carotid artery disease (HCC)   Relevant Medications   atorvastatin (LIPITOR) 40 MG tablet   enalapril (VASOTEC) 5 MG tablet   hydrochlorothiazide (MICROZIDE) 12.5 MG capsule   metoprolol succinate (TOPROL-XL) 50 MG 24 hr tablet   CKD (chronic kidney disease) stage 3, GFR 30-59 ml/min    Stable period.       Relevant Orders   Renal function panel   CBC with Differential/Platelet   Health care maintenance    Preventative protocols reviewed and updated unless pt declined. Discussed healthy diet  and lifestyle.       Medicare annual wellness visit, subsequent - Primary    I have personally reviewed the Medicare Annual Wellness questionnaire and have noted 1. The patient's medical and social history 2. Their use of alcohol, tobacco or illicit drugs 3. Their current medications and supplements 4. The patient's functional ability including ADL's, fall risks, home safety risks and hearing or visual impairment. Cognitive function has been assessed and addressed as indicated.  5. Diet and physical activity 6. Evidence for depression or mood disorders The patients weight, height, BMI have been recorded in the chart. I have made referrals, counseling and provided education to the patient based on review of the above and I have provided the pt with a written personalized care plan for preventive services. Provider list updated.. See scanned questionairre as needed for further documentation. Reviewed preventative protocols and updated unless pt declined.       Melanoma in situ [facility])    Regular with derm checks      Paroxysmal atrial fibrillation (HCC)    New - however pt compensating well.  Given tachycardia on EKG will increase metoprolol XL to 75mg  daily. Also discussed possible anticoagulant need (already on plavix). Will try and expedite cardiology referral.  CHADS2 score = 5  EKG - atrial fibilltaion with RVR      Relevant Medications   atorvastatin (LIPITOR) 40 MG tablet   enalapril (VASOTEC) 5 MG tablet   hydrochlorothiazide (MICROZIDE) 12.5 MG capsule   metoprolol succinate (TOPROL-XL) 50 MG 24 hr tablet   Other Relevant Orders   EKG 12-Lead (Completed)   Ambulatory referral to Cardiology   TSH   Renal cyst, acquired, right    Stable on serial imaging. abd mass palpable.        Other Visit Diagnoses   None.      Follow up plan: Return in about 1 year (around 12/30/2016), or as needed, for medicare wellness visit.  Ria Bush, MD

## 2015-12-31 NOTE — Assessment & Plan Note (Signed)
Preventative protocols reviewed and updated unless pt declined. Discussed healthy diet and lifestyle.  

## 2015-12-31 NOTE — Assessment & Plan Note (Addendum)
Stable on serial imaging. abd mass palpable.

## 2015-12-31 NOTE — Patient Instructions (Addendum)
Check on celebrex coupon.  EKG today for concern for atrial fibrillation. We will call you with results and plan. We may have you return to see Dr Irish Lack sooner than November. Return in 3-4 months for labs to recheck kidneys and hemoglobin.  Good to see you today, call us with questions.   Atrial Fibrillation Atrial fibrillation is a type of irregular or rapid heartbeat (arrhythmia). In atrial fibrillation, the heart quivers continuously in a chaotic pattern. This occurs when parts of the heart receive disorganized signals that make the heart unable to pump blood normally. This can increase the risk for stroke, heart failure, and other heart-related conditions. There are different types of atrial fibrillation, including:  Paroxysmal atrial fibrillation. This type starts suddenly, and it usually stops on its own shortly after it starts.  Persistent atrial fibrillation. This type often lasts longer than a week. It may stop on its own or with treatment.  Long-lasting persistent atrial fibrillation. This type lasts longer than 12 months.  Permanent atrial fibrillation. This type does not go away. Talk with your health care provider to learn about the type of atrial fibrillation that you have. CAUSES This condition is caused by some heart-related conditions or procedures, including:  A heart attack.  Coronary artery disease.  Heart failure.  Heart valve conditions.  High blood pressure.  Inflammation of the sac that surrounds the heart (pericarditis).  Heart surgery.  Certain heart rhythm disorders, such as Wolf-Parkinson-White syndrome. Other causes include:  Pneumonia.  Obstructive sleep apnea.  Blockage of an artery in the lungs (pulmonary embolism, or PE).  Lung cancer.  Chronic lung disease.  Thyroid problems, especially if the thyroid is overactive (hyperthyroidism).  Caffeine.  Excessive alcohol use or illegal drug use.  Use of some medicines, including certain  decongestants and diet pills. Sometimes, the cause cannot be found. RISK FACTORS This condition is more likely to develop in:  People who are older in age.  People who smoke.  People who have diabetes mellitus.  People who are overweight (obese).  Athletes who exercise vigorously. SYMPTOMS Symptoms of this condition include:  A feeling that your heart is beating rapidly or irregularly.  A feeling of discomfort or pain in your chest.  Shortness of breath.  Sudden light-headedness or weakness.  Getting tired easily during exercise. In some cases, there are no symptoms. DIAGNOSIS Your health care provider may be able to detect atrial fibrillation when taking your pulse. If detected, this condition may be diagnosed with:  An electrocardiogram (ECG).  A Holter monitor test that records your heartbeat patterns over a 24-hour period.  Transthoracic echocardiogram (TTE) to evaluate how blood flows through your heart.  Transesophageal echocardiogram (TEE) to view more detailed images of your heart.  A stress test.  Imaging tests, such as a CT scan or chest X-ray.  Blood tests. TREATMENT The main goals of treatment are to prevent blood clots from forming and to keep your heart beating at a normal rate and rhythm. The type of treatment that you receive depends on many factors, such as your underlying medical conditions and how you feel when you are experiencing atrial fibrillation. This condition may be treated with:  Medicine to slow down the heart rate, bring the heart's rhythm back to normal, or prevent clots from forming.  Electrical cardioversion. This is a procedure that resets your heart's rhythm by delivering a controlled, low-energy shock to the heart through your skin.  Different types of ablation, such as catheter ablation, catheter  ablation with pacemaker, or surgical ablation. These procedures destroy the heart tissues that send abnormal signals. When the  pacemaker is used, it is placed under your skin to help your heart beat in a regular rhythm. HOME CARE INSTRUCTIONS  Take over-the counter and prescription medicines only as told by your health care provider.  If your health care provider prescribed a blood-thinning medicine (anticoagulant), take it exactly as told. Taking too much blood-thinning medicine can cause bleeding. If you do not take enough blood-thinning medicine, you will not have the protection that you need against stroke and other problems.  Do not use tobacco products, including cigarettes, chewing tobacco, and e-cigarettes. If you need help quitting, ask your health care provider.  If you have obstructive sleep apnea, manage your condition as told by your health care provider.  Do not drink alcohol.  Do not drink beverages that contain caffeine, such as coffee, soda, and tea.  Maintain a healthy weight. Do not use diet pills unless your health care provider approves. Diet pills may make heart problems worse.  Follow diet instructions as told by your health care provider.  Exercise regularly as told by your health care provider.  Keep all follow-up visits as told by your health care provider. This is important. PREVENTION  Avoid drinking beverages that contain caffeine or alcohol.  Avoid certain medicines, especially medicines that are used for breathing problems.  Avoid certain herbs and herbal medicines, such as those that contain ephedra or ginseng.  Do not use illegal drugs, such as cocaine and amphetamines.  Do not smoke.  Manage your high blood pressure. SEEK MEDICAL CARE IF:  You notice a change in the rate, rhythm, or strength of your heartbeat.  You are taking an anticoagulant and you notice increased bruising.  You tire more easily when you exercise or exert yourself. SEEK IMMEDIATE MEDICAL CARE IF:  You have chest pain, abdominal pain, sweating, or weakness.  You feel nauseous.  You notice  blood in your vomit, bowel movement, or urine.  You have shortness of breath.  You suddenly have swollen feet and ankles.  You feel dizzy.  You have sudden weakness or numbness of the face, arm, or leg, especially on one side of the body.  You have trouble speaking, trouble understanding, or both (aphasia).  Your face or your eyelid droops on one side. These symptoms may represent a serious problem that is an emergency. Do not wait to see if the symptoms will go away. Get medical help right away. Call your local emergency services (911 in the [place] Do not drive yourself to the hospital.   This information is not intended to replace advice given to you by your health care provider. Make sure you discuss any questions you have with your health care provider.   Document Released: 04/28/2005 Document Revised: 01/17/2015 Document Reviewed: 08/23/2014 Elsevier Interactive Patient Education [employer].

## 2015-12-31 NOTE — Assessment & Plan Note (Signed)
Stable period.  

## 2015-12-31 NOTE — Progress Notes (Signed)
Pre visit review using our clinic review tool, if applicable. No additional management support is needed unless otherwise documented below in the visit note. 

## 2015-12-31 NOTE — Assessment & Plan Note (Signed)
Received, scanned durable power of attorney form (10/2014). Son and daughter are joint HCPOA's. Does not want prolonged life support if terminal condition.

## 2015-12-31 NOTE — Assessment & Plan Note (Signed)

## 2015-12-31 NOTE — Assessment & Plan Note (Deleted)
Monitoring yearly by carotid US. Only on plavix now.

## 2015-12-31 NOTE — Assessment & Plan Note (Addendum)
New - however pt compensating well.  Given tachycardia on EKG will increase metoprolol XL to 75mg  daily. Also discussed possible anticoagulant need (already on plavix). Will try and expedite cardiology referral.  CHADS2 score = 5  EKG - atrial fibilltaion with RVR

## 2016-01-01 ENCOUNTER — Encounter: Payer: Self-pay | Admitting: Cardiology

## 2016-01-01 ENCOUNTER — Other Ambulatory Visit: Payer: Self-pay

## 2016-01-01 MED ORDER — ENALAPRIL MALEATE 5 MG PO TABS: 5.0000 mg | ORAL_TABLET | Freq: Every day | ORAL | 3 refills | Status: DC

## 2016-01-01 MED ORDER — HYDROCHLOROTHIAZIDE 12.5 MG PO CAPS: 12.5000 mg | ORAL_CAPSULE | Freq: Every day | ORAL | 3 refills | Status: DC

## 2016-01-01 MED ORDER — METOPROLOL SUCCINATE ER 50 MG PO TB24: ORAL_TABLET | ORAL | 3 refills | Status: DC

## 2016-01-01 MED ORDER — CLOPIDOGREL BISULFATE 75 MG PO TABS: 75.0000 mg | ORAL_TABLET | Freq: Every day | ORAL | 3 refills | Status: DC

## 2016-01-01 MED ORDER — ATORVASTATIN CALCIUM 40 MG PO TABS: 40.0000 mg | ORAL_TABLET | Freq: Every day | ORAL | 3 refills | Status: DC

## 2016-01-01 MED ORDER — CELECOXIB 100 MG PO CAPS: 100.0000 mg | ORAL_CAPSULE | Freq: Every day | ORAL | 3 refills | Status: DC

## 2016-01-01 NOTE — Assessment & Plan Note (Signed)
Regular with derm checks

## 2016-01-01 NOTE — Telephone Encounter (Signed)
Pt was seen yesterday and all refills were sent to [facility]. Pt wanted meds to [facility]. [facility] will [facility] from yesterday and [facility]. Pt voiced understanding. Please clarify instructions for metoprolol and then send to [facility].

## 2016-01-02 ENCOUNTER — Encounter: Payer: Self-pay | Admitting: Physician Assistant

## 2016-01-02 ENCOUNTER — Telehealth: Payer: Self-pay | Admitting: Physician Assistant

## 2016-01-02 ENCOUNTER — Ambulatory Visit (INDEPENDENT_AMBULATORY_CARE_PROVIDER_SITE_OTHER): Payer: Medicare Other | Admitting: Physician Assistant

## 2016-01-02 VITALS — BP 120/68 | HR 103 | Ht 61.5 in | Wt 147.0 lb

## 2016-01-02 DIAGNOSIS — I4891 Unspecified atrial fibrillation: Secondary | ICD-10-CM

## 2016-01-02 DIAGNOSIS — I779 Disorder of arteries and arterioles, unspecified: Secondary | ICD-10-CM | POA: Diagnosis not present

## 2016-01-02 DIAGNOSIS — E785 Hyperlipidemia, unspecified: Secondary | ICD-10-CM | POA: Diagnosis not present

## 2016-01-02 DIAGNOSIS — I739 Peripheral vascular disease, unspecified: Secondary | ICD-10-CM

## 2016-01-02 DIAGNOSIS — I10 Essential (primary) hypertension: Secondary | ICD-10-CM

## 2016-01-02 LAB — CBC
HCT: 48.4 % — ABNORMAL HIGH (ref 35.0–45.0)
Hemoglobin: 16.8 g/dL — ABNORMAL HIGH (ref 11.7–15.5)
MCH: 33.1 pg — ABNORMAL HIGH (ref 27.0–33.0)
MCHC: 34.7 g/dL (ref 32.0–36.0)
MCV: 95.3 fL (ref 80.0–100.0)
MPV: 11.2 fL (ref 7.5–12.5)
PLATELETS: 253 10*3/uL (ref 140–400)
RBC: 5.08 MIL/uL (ref 3.80–5.10)
RDW: 13.5 % (ref 11.0–15.0)
WBC: 6.8 10*3/uL (ref 3.8–10.8)

## 2016-01-02 LAB — BASIC METABOLIC PANEL
BUN: 19 mg/dL (ref 7–25)
CALCIUM: 10.3 mg/dL (ref 8.6–10.4)
CO2: 27 mmol/L (ref 20–31)
CREATININE: 1.04 mg/dL — AB (ref 0.60–0.93)
Chloride: 101 mmol/L (ref 98–110)
Glucose, Bld: 96 mg/dL (ref 65–99)
POTASSIUM: 4.1 mmol/L (ref 3.5–5.3)
SODIUM: 138 mmol/L (ref 135–146)

## 2016-01-02 LAB — TSH: TSH: 2.37 mIU/L

## 2016-01-02 MED ORDER — METOPROLOL SUCCINATE ER 100 MG PO TB24: 100.0000 mg | ORAL_TABLET | Freq: Every day | ORAL | 0 refills | Status: DC

## 2016-01-02 MED ORDER — METOPROLOL SUCCINATE ER 100 MG PO TB24: 100.0000 mg | ORAL_TABLET | Freq: Every day | ORAL | 3 refills | Status: DC

## 2016-01-02 MED ORDER — APIXABAN 5 MG PO TABS: 5.0000 mg | ORAL_TABLET | Freq: Two times a day (BID) | ORAL | 3 refills | Status: DC

## 2016-01-02 NOTE — Progress Notes (Signed)
Cardiology Office Note    Date:  01/02/2016   ID:  [name], DOB 1940/10/21, MRN [id number]  PCP:  Ria Bush, MD  Cardiologist:  Dr. Irish Lack  Chief Complaint: Atrial fibrillation   History of Present Illness:   [name] is a 75 y.o. female  HTN, HLD, bilateral carotid artery stenosis and  R kidney cyst (8cm) with R abdominal mass and CKD stage III who sent by PCP for new onset Afib.  She has had a right carotid stent after restenosis post CEA. She saw Dr. Edd Arbour at [facility].  She had a left carotid stent done in 1/14. Carotid doppler November 2016 showed only moderate disease bilaterally.--> repeat study in one year. She is on plavix only.   The patient was seen by PCP 12/31/15 for annual exam and found to be in new onset afib with RVR. Increased metoprolol XL to 75mg .  Presents today for Afib.She goes to gyn 6 times/weeks. The patient denies nausea, vomiting, fever, chest pain, palpitations, shortness of breath, orthopnea, PND, dizziness, syncope, cough, congestion, abdominal pain, hematochezia, melena, lower extremity edema. No recent illness.    Past Medical History:  Diagnosis Date  . Arthritis    hands, knees, ankles  . BCC (basal cell carcinoma of skin) 2016   [place]  . Bilateral carotid artery stenosis    R patent, L 80-99% s/p stent placed [facility], Dr. Edd Arbour)  . Fibrocystic breast disease    followed by surgery Jam[facility] Collin)  . GERD (gastroesophageal reflux disease)    OTC prilosec  . Hernia 2005  . History of chicken pox   . History of colon polyps   . History of melanoma 1990s   right shoulder  . HLD (hyperlipidemia)   . HTN (hypertension) 1970s  . Hypertensive retinopathy 2015   stage 1 ([place])  . Hypertensive retinopathy of both eyes, grade 1 2017   Bulakowski  . Kidney cyst, acquired 2016   large R septated 8cm, stable 2017  . Melanoma in situ ([place]) 2017   R medial posterior arm - lentio maligna type, margin involved ([place])     Past Surgical History:  Procedure Laterality Date  . ABI  2007   R 1.19, L 1.2  . APPENDECTOMY  1995  . BREAST BIOPSY Left 1997   left  . BREAST CYST ASPIRATION Left 02/1997   fna  . BREAST EXCISIONAL BIOPSY Right 1998   right  . CARDIOVASCULAR STRESS TEST  ~2003   WNL  . CAROTID ENDARTERECTOMY  2002   bilateral  . CAROTID STENT Right 2002   right after restenosis post CEA (Dr. Edd Arbour)  . CAROTID STENT  2014   for stenosis  . CAROTID STENT Left 2014   Dr. Ardeth Sportsman, [place]  . CESAREAN SECTION  1968; 1972  . COLONOSCOPY  03/2012   Sankar - polyp (rec rpt 5 yrs)  . EXPLORATORY LAPAROTOMY  1990s   for GI pain, inflamed lower GI, unrevealing workup, improved with abx  . HERNIA REPAIR  2005   abdomen  . MELANOMA EXCISION  1998   right shoulder  . TOTAL HIP ARTHROPLASTY  2005   bilat    Current Medications: Prior to Admission medications   Medication Sig Start Date End Date Taking? Authorizing Provider  atorvastatin (LIPITOR) 40 MG tablet Take 1 tablet (40 mg total) by mouth at bedtime. 01/01/16   Ria Bush, MD  calcium-vitamin D (OSCAL WITH D) 500-200 MG-UNIT per tablet Take 1 tablet by mouth  daily.    Historical Provider, MD  celecoxib (CELEBREX) 100 MG capsule Take 1 capsule (100 mg total) by mouth daily. 01/01/16   Ria Bush, MD  clopidogrel (PLAVIX) 75 MG tablet Take 1 tablet (75 mg total) by mouth daily. 01/01/16   Ria Bush, MD  enalapril (VASOTEC) 5 MG tablet Take 1 tablet (5 mg total) by mouth daily. 01/01/16   Ria Bush, MD  fluticasone [facility]) 50 MCG/ACT nasal spray Place 2 sprays into both nostrils daily as needed for allergies or rhinitis.    Historical Provider, MD  hydrochlorothiazide (MICROZIDE) 12.5 MG capsule Take 1 capsule (12.5 mg total) by mouth daily. 01/01/16   Ria Bush, MD  metoprolol succinate (TOPROL-XL) 50 MG 24 hr tablet Take 1.5 tablet by mouth  daily with or immediately following a meal 01/01/16   Ria Bush, MD   Multiple Vitamin (MULTIVITAMIN) capsule Take 1 capsule by mouth daily.    Historical Provider, MD  triamcinolone cream (KENALOG) 0.1 % 1 APPLICATION APPLY ON THE SKIN AS DIRECTED APPLY TO AFFECTED AREAS IN EARS DAILY AS NEEDED. 04/12/15   Historical Provider, MD    Allergies:   Review of patient's allergies indicates no known allergies.   Social History   Social History  . Marital status: Widowed    Spouse name: N/A  . Number of children: N/A  . Years of education: N/A   Social History Main Topics  . Smoking status: Former Smoker    Packs/day: 1.00    Years: 30.00  . Smokeless tobacco: Never Used  . Alcohol use 0.0 oz/week     Comment: 1 glass wine daily  . Drug use: No  . Sexual activity: Not Asked   Other Topics Concern  . None   Social History Narrative   Caffeine: 3 cups coffee/day   Widow of husband, lives with 1 dog.  Grown children   Occupation: homemaker   Edu: college   Activity: plays golf, walks dog, swims   Diet: good water, fruits/vegetables daily     Family History:  The patient's family history includes Cancer (age of onset: 94) in her father; Cancer (age of onset: 52) in her mother; Hypertension in her paternal grandfather.   ROS:   Please see the history of present illness.    ROS All other systems reviewed and are negative.   PHYSICAL EXAM:   VS:  BP 120/68   Pulse (!) 103   Ht 5' 1.5" (1.562 m)   Wt 147 lb (66.7 kg)   BMI 27.33 kg/m    GEN: Well nourished, well developed, in no acute distress  HEENT: normal  Neck: no JVD, carotid bruits, or masses Cardiac: Ir Ir tachycardia; no murmurs, rubs, or gallops,no edema  Respiratory:  clear to auscultation bilaterally, normal work of breathing GI: soft, nontender, + BS MS: no deformity or atrophy  Skin: warm and dry, no rash Neuro:  Alert and Oriented x 3, Strength and sensation are intact Psych: euthymic mood, full affect  Wt Readings from Last 3 Encounters:  01/02/16 147 lb (66.7 kg)   12/31/15 146 lb 8 oz (66.5 kg)  05/28/15 147 lb (66.7 kg)      Studies/Labs Reviewed:   EKG:  EKG is ordered today.  The ekg ordered today demonstrates Afib @ rate of 103 bpm.   Recent Labs: 12/24/2015: BUN 21; Creatinine, Ser 1.06; Hemoglobin 15.8; Platelets 204.0; Potassium 4.6; Sodium 140   Lipid Panel    Component Value Date/Time   CHOL 129  12/24/2015 0908   TRIG 109.0 12/24/2015 0908   TRIG 144 03/28/2011   HDL 45.30 12/24/2015 0908   CHOLHDL 3 12/24/2015 0908   VLDL 21.8 12/24/2015 0908   LDLCALC 62 12/24/2015 0908   LDLDIRECT 78 03/28/2011    Additional studies/ records that were reviewed today include:   As above   ASSESSMENT & PLAN:    1. New onset atrial fibrillation - Unknown duration. She is asymptomatic. Rate minimally elevated. BP stable. Will increase Toprol XL to 100mg  qd. CHADSVASCs score of 5 (age, sex, HTN and vascular disease). Will start Eliquis 5mg  BID and stop Plavix. Get TSH, CBC and BMET.  Scr was normal 12/24/15. Will get echocardiogram. If low EF and WM abnormality will need ischemic evaluation. Recheck in 3 weeks if still in afib consider DCCV after 4 weeks of continuous anticoagulation. Stop Celebrex and try tylenol for pain --> if no improvement f/u with PCP.   2. Bilateral carotid artery disease - s/p bilateral carotid artery stenting. Last carotid doppler November 2016 showed only moderate disease bilaterally.--> repeat study in one year. Will stop Plavix due to need of anticoagulation.   3. HTN - Stable and well controlled. Continue Enalapril, HCTZ and BB as described above.   4. HLD - 12/24/2015: Cholesterol 129; HDL 45.30; LDL Cholesterol 62; Triglycerides 109.0; VLDL 21.8  - Continue statin    Medication Adjustments/Labs and Tests Ordered: Current medicines are reviewed at length with the patient today.  Concerns regarding medicines are outlined above.  Medication changes, Labs and Tests ordered today are listed in the Patient  Instructions below. Patient Instructions  Medication Instructions: Your physician has recommended you make the following change in your medication:   1. STOP Plavix 2. STOP Celebrex 3. INCREASE Toprol XL 100 mg (1 tablet) by mouth daily - new RX sent 3. START Eliquis 5 mg (1 tablet) by mouth twice daily - new RX sent   Labwork: TODAY : TSH, CBC, BMP  Procedures/Testing: Your physician has requested that you have an echocardiogram. Echocardiography is a painless test that uses sound waves to create images of your heart. It provides your doctor with information about the size and shape of your heart and how well your heart's chambers and valves are working. This procedure takes approximately one hour. There are no restrictions for this procedure.    Follow-Up: Your physician recommends that you schedule a follow-up appointment in: 3 WEEKS with any APP on a day that Dr. Irish Lack is in the office.   Any Additional Special Instructions Will Be Listed Below (If Applicable).     If you need a refill on your cardiac medications before your next appointment, please call your pharmacy.      Jarrett Soho, [place]  01/02/2016 3:13 PM    [facility] [address], [place], [place]  [address] Phone: [phone]; Fax: [phone]

## 2016-01-02 NOTE — Patient Instructions (Signed)
Medication Instructions: Your physician has recommended you make the following change in your medication:   1. STOP Plavix 2. STOP Celebrex 3. INCREASE Toprol XL 100 mg (1 tablet) by mouth daily - new RX sent 3. START Eliquis 5 mg (1 tablet) by mouth twice daily - new RX sent   Labwork: TODAY : TSH, CBC, BMP  Procedures/Testing: Your physician has requested that you have an echocardiogram. Echocardiography is a painless test that uses sound waves to create images of your heart. It provides your doctor with information about the size and shape of your heart and how well your heart's chambers and valves are working. This procedure takes approximately one hour. There are no restrictions for this procedure.    Follow-Up: Your physician recommends that you schedule a follow-up appointment in: 3 WEEKS with any APP on a day that Dr. Irish Lack is in the office.   Any Additional Special Instructions Will Be Listed Below (If Applicable).     If you need a refill on your cardiac medications before your next appointment, please call your pharmacy.

## 2016-01-02 NOTE — Telephone Encounter (Signed)
New Message:    Prescription written today for Eliquis needs prior authorization. Please call [phone],

## 2016-01-03 ENCOUNTER — Encounter (HOSPITAL_COMMUNITY): Payer: Self-pay

## 2016-01-03 ENCOUNTER — Ambulatory Visit (HOSPITAL_COMMUNITY): Payer: Medicare Other

## 2016-01-03 ENCOUNTER — Encounter (HOSPITAL_COMMUNITY): Payer: Self-pay | Admitting: *Deleted

## 2016-01-03 NOTE — Progress Notes (Unsigned)
Echo attempted, patient is in Atrial Fibrillation with rate 120-150's.  Spoke with Ellen Henri PA, and [place] PA who both agree to reschedule the echo.  We have held a spot on Tuesday the 29th at [facility] (nurse) will consult with Dr. Caryl Comes about possible medication vs. Cardioversion options.  They will contact Mrs. Armbrister on Monday to confirm plans.  Mrs. Matchett is stable with BP 126/102 ([place] started yesterday), and does not complain of any symptoms.    Deliah Boston, RDCS

## 2016-01-03 NOTE — Telephone Encounter (Signed)
Eliquis 5 mg approved by [facility] today. PA- [id number]. Patient informed of this.

## 2016-01-04 ENCOUNTER — Telehealth: Payer: Self-pay

## 2016-01-04 MED ORDER — DILTIAZEM HCL ER COATED BEADS 120 MG PO CP24: 120.0000 mg | ORAL_CAPSULE | Freq: Every day | ORAL | 0 refills | Status: DC

## 2016-01-04 NOTE — Telephone Encounter (Signed)
Per echo visit  01/03/16 discussed options to slow her heart rate down in order to obtain images for echo.  I discussed with Robbie Lis PA what options he thought would be best and he instructed me to give her the options of: 1. Starting Cardizem 120 mg PO QD or  2. Having TEE Cardioversion in which he would have to first check with DOD or Dr. Irish Lack (primary cardiologists) I told her that if her if she were to choose the medication route she would be able to repeat the echo on 8/29 @ 4:00 as discussed yesterday and would need to stay on the medication until she came back to be seen in the office 9/14 with Dayna Dunn.  I also made her aware that if she were to chose the TEE route that she would have to be seen back in the office prior to scheduling. She told me that she would like to go the medication route and would like to proceed with the echo on 8/29.  I told her I would get with Beth to Schedule that and that [facility] and take today, Saturday, Sunday, Monday,Tuesday, and so forth until she returns to see Melina Copa 01/24/16 at which point Dayna could make the decision to continue or d/c.  She expressed understanding and was agreeable to this plan moving forward.

## 2016-01-04 NOTE — Telephone Encounter (Signed)
Per echo visit  01/03/16 discussed options to slow her mothers heart rate down in order to obtain images for echo.  I discussed with Robbie Lis PA what options he thought would be best and he instructed me to give her mother the options of: 1. Starting Cardizem 120 mg PO QD or  2. Having TEE Cardioversion in which he would have to first check with DOD or Dr. Irish Lack (primary cardiologists) I told her that if her mother were to choose the medication route she would be able to repeat the echo on 8/29 @ 4:00 as discussed yesterday and would need to stay on the medication until she came back to be seen in the office 9/14 with Dayna Dunn.  I also made her aware that if she chose the TEE route that she would have to be seen back in the office prior to scheduling. I told her I would be following up with her mother later today to see which option she would like to go with.  She expressed understanding and was agreeable to this plan moving forward.

## 2016-01-07 ENCOUNTER — Other Ambulatory Visit: Payer: Self-pay

## 2016-01-07 ENCOUNTER — Telehealth: Payer: Self-pay | Admitting: Interventional Cardiology

## 2016-01-07 MED ORDER — METOPROLOL SUCCINATE ER 100 MG PO TB24: 100.0000 mg | ORAL_TABLET | Freq: Every day | ORAL | 3 refills | Status: DC

## 2016-01-07 NOTE — Telephone Encounter (Signed)
New message  ° ° ° °Pt is returning call for lab results  °

## 2016-01-07 NOTE — Telephone Encounter (Signed)
**Note De-Identified [name] Obfuscation** I did not call the pt with lab results. Thanks.

## 2016-01-08 ENCOUNTER — Ambulatory Visit (HOSPITAL_COMMUNITY): Payer: Medicare Other | Attending: Cardiology

## 2016-01-08 ENCOUNTER — Other Ambulatory Visit: Payer: Self-pay

## 2016-01-08 DIAGNOSIS — N183 Chronic kidney disease, stage 3 (moderate): Secondary | ICD-10-CM | POA: Insufficient documentation

## 2016-01-08 DIAGNOSIS — I4891 Unspecified atrial fibrillation: Secondary | ICD-10-CM | POA: Diagnosis not present

## 2016-01-08 DIAGNOSIS — I340 Nonrheumatic mitral (valve) insufficiency: Secondary | ICD-10-CM | POA: Insufficient documentation

## 2016-01-08 DIAGNOSIS — E785 Hyperlipidemia, unspecified: Secondary | ICD-10-CM | POA: Diagnosis not present

## 2016-01-08 DIAGNOSIS — I071 Rheumatic tricuspid insufficiency: Secondary | ICD-10-CM | POA: Insufficient documentation

## 2016-01-08 DIAGNOSIS — I351 Nonrheumatic aortic (valve) insufficiency: Secondary | ICD-10-CM | POA: Insufficient documentation

## 2016-01-08 DIAGNOSIS — I1310 Hypertensive heart and chronic kidney disease without heart failure, with stage 1 through stage 4 chronic kidney disease, or unspecified chronic kidney disease: Secondary | ICD-10-CM | POA: Insufficient documentation

## 2016-01-08 DIAGNOSIS — Z87891 Personal history of nicotine dependence: Secondary | ICD-10-CM | POA: Insufficient documentation

## 2016-01-22 ENCOUNTER — Other Ambulatory Visit (INDEPENDENT_AMBULATORY_CARE_PROVIDER_SITE_OTHER): Payer: Medicare Other

## 2016-01-22 DIAGNOSIS — N183 Chronic kidney disease, stage 3 unspecified: Secondary | ICD-10-CM

## 2016-01-22 LAB — CBC WITH DIFFERENTIAL/PLATELET
Basophils Absolute: 0 10*3/uL (ref 0.0–0.1)
Basophils Relative: 0.8 % (ref 0.0–3.0)
Eosinophils Absolute: 0.1 10*3/uL (ref 0.0–0.7)
Eosinophils Relative: 1.9 % (ref 0.0–5.0)
HCT: 44.6 % (ref 36.0–46.0)
Hemoglobin: 15.6 g/dL — ABNORMAL HIGH (ref 12.0–15.0)
LYMPHS ABS: 1.7 10*3/uL (ref 0.7–4.0)
Lymphocytes Relative: 27.3 % (ref 12.0–46.0)
MCHC: 35 g/dL (ref 30.0–36.0)
MCV: 95.3 fl (ref 78.0–100.0)
MONOS PCT: 7.2 % (ref 3.0–12.0)
Monocytes Absolute: 0.4 10*3/uL (ref 0.1–1.0)
NEUTROS ABS: 3.8 10*3/uL (ref 1.4–7.7)
NEUTROS PCT: 62.8 % (ref 43.0–77.0)
PLATELETS: 212 10*3/uL (ref 150.0–400.0)
RBC: 4.68 Mil/uL (ref 3.87–5.11)
RDW: 13.7 % (ref 11.5–15.5)
WBC: 6.1 10*3/uL (ref 4.0–10.5)

## 2016-01-22 LAB — RENAL FUNCTION PANEL
Albumin: 4.6 g/dL (ref 3.5–5.2)
BUN: 21 mg/dL (ref 6–23)
CALCIUM: 10 mg/dL (ref 8.4–10.5)
CO2: 34 meq/L — AB (ref 19–32)
Chloride: 102 mEq/L (ref 96–112)
Creatinine, Ser: 1.01 mg/dL (ref 0.40–1.20)
GFR: 56.46 mL/min — AB (ref >60.00)
Glucose, Bld: 91 mg/dL (ref 70–99)
POTASSIUM: 4.1 meq/L (ref 3.5–5.1)
Phosphorus: 3.2 mg/dL (ref 2.3–4.6)
Sodium: 139 mEq/L (ref 135–145)

## 2016-01-23 ENCOUNTER — Other Ambulatory Visit (INDEPENDENT_AMBULATORY_CARE_PROVIDER_SITE_OTHER): Payer: Medicare Other | Admitting: Family Medicine

## 2016-01-23 ENCOUNTER — Encounter: Payer: Self-pay | Admitting: Physician Assistant

## 2016-01-23 DIAGNOSIS — D751 Secondary polycythemia: Secondary | ICD-10-CM

## 2016-01-23 DIAGNOSIS — N184 Chronic kidney disease, stage 4 (severe): Secondary | ICD-10-CM

## 2016-01-23 DIAGNOSIS — N189 Chronic kidney disease, unspecified: Secondary | ICD-10-CM

## 2016-01-23 NOTE — Progress Notes (Signed)
Cardiology Office Note    Date:  01/24/2016  ID:  [name], DOB 05-Sep-1940, MRN [id number] PCP:  Ria Bush, MD  Cardiologist:  Irish Lack  Chief Complaint: f/u afib  History of Present Illness:  [name] is a 75 y.o. female with history of HTN, HLD, CKD stage III, carotid artery disease s/p right carotid stent after restenosis post CEA, left carotid stent (previously on Plavix for these), GERD, hypertensive retinopathy, kidney cyst, atrial fibrillation (diagnosed 12/2015) who presents for f/u. She has been followed in the past by Dr. Irish Lack locally for her carotid disease (remotely followed at [facility]) - duplex 03/2015 with stable 50-69% ICA stenosis, >50% ECA stenosis, f/u due 03/2016. She saw her PCP 12/31/15 for annual physical and was found to be in new onset AF RVR - BB increased at that time. Asymptomatic - very active, going to gym 6x/week. She saw Vin Bhagat PA-C on 01/02/16 at which time her Toprol was increased to 100mg  daily, Plavix was stopped, and she was placed on Eliquis 5mg  BID for CHADSVASC 5. She was asked to f/u to consider DCCV. Her echo was attempted 8/24 but HR was 120-150s thus Cardizem was added and echo r/s. 2D Echo 01/08/2016: mild focal basal septal hypertrophy of septum, EF 55-60%, grade 2 DD, mild MR, mod LAE, mildly increased PASP. Labs 01/22/16: K 4.1, Cr 1.01, glu 91, Hgb 15.6. TSH wnl and LDL 62 in 12/2015.  She returns for follow-up today doing well. She is back in NSR. She denies any cardiac symptoms while in afib or in NSR. Works out 6 days a week at [employer]. No CP, SOB, palpitations, functional limitation, dizziness, or syncope. No bleeding reported.   Past Medical History:  Diagnosis Date  . Arthritis    hands, knees, ankles  . Atrial fibrillation ([place])    a. dx 12/2015 at routine physical.  . BCC (basal cell carcinoma of skin) 2016   Stinehelfer  . Bilateral carotid artery stenosis    a. s/p right carotid stent after restenosis post CEA,  left carotid stent (previously on Plavix for these)  . Fibrocystic breast disease    followed by surgery Jamal Collin)  . GERD (gastroesophageal reflux disease)    OTC prilosec  . Hernia 2005  . History of chicken pox   . History of colon polyps   . History of melanoma 1990s   right shoulder  . HLD (hyperlipidemia)   . HTN (hypertension) 1970s  . Hypertensive retinopathy of both eyes, grade 1 2017   Bulakowski  . Kidney cyst, acquired 2016   large R septated 8cm, stable 2017  . Melanoma in situ ([place]) 2017   R medial posterior arm - lentio maligna type, margin involved ([place])    Past Surgical History:  Procedure Laterality Date  . ABI  2007   R 1.19, L 1.2  . APPENDECTOMY  1995  . BREAST BIOPSY Left 1997   left  . BREAST CYST ASPIRATION Left 02/1997   fna  . BREAST EXCISIONAL BIOPSY Right 1998   right  . CARDIOVASCULAR STRESS TEST  ~2003   WNL  . CAROTID ENDARTERECTOMY  2002   bilateral  . CAROTID STENT Right 2002   right after restenosis post CEA (Dr. Edd Arbour)  . CAROTID STENT  2014   for stenosis  . CAROTID STENT Left 2014   Dr. Ardeth Sportsman, [place]  . CESAREAN SECTION  1968; 1972  . COLONOSCOPY  03/2012   Sankar - polyp (rec rpt  5 yrs)  . EXPLORATORY LAPAROTOMY  1990s   for GI pain, inflamed lower GI, unrevealing workup, improved with abx  . HERNIA REPAIR  2005   abdomen  . MELANOMA EXCISION  1998   right shoulder  . TOTAL HIP ARTHROPLASTY  2005   bilat    Current Medications: Current Outpatient Prescriptions  Medication Sig Dispense Refill  . apixaban (ELIQUIS) 5 MG TABS tablet Take 1 tablet (5 mg total) by mouth 2 (two) times daily. 180 tablet 3  . atorvastatin (LIPITOR) 40 MG tablet Take 1 tablet (40 mg total) by mouth at bedtime. 90 tablet 3  . calcium-vitamin D (OSCAL WITH D) 500-200 MG-UNIT per tablet Take 1 tablet by mouth daily.    Marland Kitchen diltiazem (CARDIZEM CD) 120 MG 24 hr capsule Take 1 capsule (120 mg total) by mouth daily. 30 capsule 0  . enalapril  (VASOTEC) 5 MG tablet Take 1 tablet (5 mg total) by mouth daily. 90 tablet 3  . fluticasone (FLONASE) 50 MCG/ACT nasal spray Place 2 sprays into both nostrils daily as needed for allergies or rhinitis.    . hydrochlorothiazide (MICROZIDE) 12.5 MG capsule Take 1 capsule (12.5 mg total) by mouth daily. 90 capsule 3  . metoprolol succinate (TOPROL-XL) 100 MG 24 hr tablet Take 1 tablet (100 mg total) by mouth daily. 90 tablet 3  . Multiple Vitamin (MULTIVITAMIN) capsule Take 1 capsule by mouth daily.    Marland Kitchen triamcinolone cream (KENALOG) 0.1 % 1 APPLICATION APPLY ON THE SKIN AS DIRECTED APPLY TO AFFECTED AREAS IN EARS DAILY AS NEEDED.  0  . triamcinolone cream (KENALOG) 0.1 % Apply 1 application topically daily as needed (skin cancer).     No current facility-administered medications for this visit.      Allergies:   Review of patient's allergies indicates no known allergies.   Social History   Social History  . Marital status: Widowed    Spouse name: N/A  . Number of children: N/A  . Years of education: N/A   Social History Main Topics  . Smoking status: Former Smoker    Packs/day: 1.00    Years: 30.00  . Smokeless tobacco: Never Used  . Alcohol use 0.0 oz/week     Comment: 1 glass wine daily  . Drug use: No  . Sexual activity: Not Asked   Other Topics Concern  . None   Social History Narrative   Caffeine: 3 cups coffee/day   Widow of husband, lives with 1 dog.  Grown children   Occupation: homemaker   Edu: college   Activity: plays golf, walks dog, swims   Diet: good water, fruits/vegetables daily     Family History:  The patient's family history includes Cancer (age of onset: 62) in her father; Cancer (age of onset: 62) in her mother; Hypertension in her paternal grandfather.   ROS:   Please see the history of present illness. Otherwise, review of systems is positive for seasonal allergies, arthritis. All other systems are reviewed and otherwise negative.    PHYSICAL  EXAM:   VS:  BP 100/80   Pulse 73   Ht 5\' 2"  (1.575 m)   Wt 144 lb 6.4 oz (65.5 kg)   SpO2 93%   BMI 26.41 kg/m   BMI: Body mass index is 26.41 kg/m. GEN: Well nourished, well developed WF, in no acute distress  HEENT: normocephalic, atraumatic Neck: no JVD, carotid bruits, or masses Cardiac: RRR; no murmurs, rubs, or gallops, no edema  Respiratory:  clear  to auscultation bilaterally, normal work of breathing GI: soft, nontender, nondistended, + BS MS: no deformity or atrophy  Skin: warm and dry, no rash Neuro:  Alert and Oriented x 3, Strength and sensation are intact, follows commands Psych: euthymic mood, full affect  Wt Readings from Last 3 Encounters:  01/24/16 144 lb 6.4 oz (65.5 kg)  01/02/16 147 lb (66.7 kg)  12/31/15 146 lb 8 oz (66.5 kg)      Studies/Labs Reviewed:   EKG:  EKG was ordered today and personally reviewed by me and demonstrates NSR 68, no acute changes  Recent Labs: 01/02/2016: TSH 2.37 01/22/2016: BUN 21; Creatinine, Ser 1.01; Hemoglobin 15.6; Platelets 212.0; Potassium 4.1; Sodium 139   Lipid Panel    Component Value Date/Time   CHOL 129 12/24/2015 0908   TRIG 109.0 12/24/2015 0908   TRIG 144 03/28/2011   HDL 45.30 12/24/2015 0908   CHOLHDL 3 12/24/2015 0908   VLDL 21.8 12/24/2015 0908   LDLCALC 62 12/24/2015 0908   LDLDIRECT 78 03/28/2011    Additional studies/ records that were reviewed today include: Summarized above.    ASSESSMENT & PLAN:   1. Paroxysmal atrial fib - spontaneously converted to NSR. Continue metoprolol and diltiazem. Continue Eliquis. Rationale for anticoagulation reviewed with patient. 2. HTN - BP running on the lower side since titrating AVN blocking agents. Will discontinue hydrochlorothiazide for now to avoid risking symptomatic hypotension. Patient to monitor for any signs of fluid retention given her diastolic dysfunction. If she does develop any I would consider placing her back on low dose HCTZ and holding ACEI  instead. I also asked her to follow BP at home and call if running [id number] or [phone] systolic. 3. Carotid artery disease s/p remote stenting - due for repeat duplex 03/2016, will schedule. I reviewed discontinuation of antiplatelet therapy with Dr. Irish Lack (Plavix stopped last visit) - he feels it is safe to remain off antiplatelet therapy for now while on Eliquis. 4. Hyperlipidemia - lipids under excellent control by PCP check 12/2015.  Disposition: F/u with Dr. Irish Lack in 05/2016. The patient had questions about her arthritis medication and seasonal allergies. We reviewed risk of bleeding with NSAIDs - I asked her to review further options with her MD. She is trying Tylenol for now. With regard to allergies, I instructed her to avoid allergy products containing pseudoephedrine or phenylephrine as they can exacerbate arrhythmias.  Medication Adjustments/Labs and Tests Ordered: Current medicines are reviewed at length with the patient today.  Concerns regarding medicines are outlined above. Medication changes, Labs and Tests ordered today are summarized above and listed in the Patient Instructions accessible in Encounters.   Raechel Ache PA-C  01/24/2016 9:04 AM    [address], [place], [place]  [address] Phone: [phone]; Fax: [phone]

## 2016-01-24 ENCOUNTER — Ambulatory Visit (INDEPENDENT_AMBULATORY_CARE_PROVIDER_SITE_OTHER): Payer: Medicare Other | Admitting: Physician Assistant

## 2016-01-24 ENCOUNTER — Ambulatory Visit (INDEPENDENT_AMBULATORY_CARE_PROVIDER_SITE_OTHER)
Admission: RE | Admit: 2016-01-24 | Discharge: 2016-01-24 | Disposition: A | Discharge status: Home / Self Care | Payer: Medicare Other | Source: Ambulatory Visit | Attending: Family Medicine | Admitting: Family Medicine

## 2016-01-24 ENCOUNTER — Encounter: Payer: Self-pay | Admitting: Physician Assistant

## 2016-01-24 VITALS — BP 100/80 | HR 73 | Ht 62.0 in | Wt 144.4 lb

## 2016-01-24 DIAGNOSIS — R918 Other nonspecific abnormal finding of lung field: Secondary | ICD-10-CM | POA: Diagnosis not present

## 2016-01-24 DIAGNOSIS — D751 Secondary polycythemia: Secondary | ICD-10-CM | POA: Diagnosis not present

## 2016-01-24 DIAGNOSIS — I480 Paroxysmal atrial fibrillation: Secondary | ICD-10-CM

## 2016-01-24 DIAGNOSIS — I10 Essential (primary) hypertension: Secondary | ICD-10-CM

## 2016-01-24 DIAGNOSIS — E785 Hyperlipidemia, unspecified: Secondary | ICD-10-CM

## 2016-01-24 DIAGNOSIS — I739 Peripheral vascular disease, unspecified: Secondary | ICD-10-CM

## 2016-01-24 DIAGNOSIS — I779 Disorder of arteries and arterioles, unspecified: Secondary | ICD-10-CM

## 2016-01-24 LAB — POC URINALSYSI DIPSTICK (AUTOMATED)
Bilirubin, UA: NEGATIVE
Glucose, UA: NEGATIVE
Ketones, UA: NEGATIVE
NITRITE UA: NEGATIVE
PH UA: 6.5
PROTEIN UA: NEGATIVE
Spec Grav, UA: 1.015
UROBILINOGEN UA: 0.2

## 2016-01-24 NOTE — Addendum Note (Signed)
Addended by: Royann Shivers A on: 01/24/2016 11:56 AM   Modules accepted: Orders

## 2016-01-24 NOTE — Patient Instructions (Signed)
Medication Instructions:  Your physician has recommended you make the following change in your medication:  1. Stop HCTZ   Labwork: -None  Testing/Procedures: Your physician has requested that you have a carotid duplex. This test is an ultrasound of the carotid arteries in your neck. It looks at blood flow through these arteries that supply the brain with blood. Allow one hour for this exam. There are no restrictions or special instructions.    Follow-Up: Your physician wants you to follow-up in: 4 months with Dr. Irish Lack.  You will receive a reminder letter in the mail two months in advance. If you don't receive a letter, please call our office to schedule the follow-up appointment.    Any Other Special Instructions Will Be Listed Below (If Applicable).  Follow Blood Pressure at home and call if running [id number] or [phone] systolic, top number.   If you need a refill on your cardiac medications before your next appointment, please call your pharmacy.

## 2016-01-26 ENCOUNTER — Encounter: Payer: Self-pay | Admitting: Family Medicine

## 2016-01-26 DIAGNOSIS — I700 Atherosclerosis of aorta: Secondary | ICD-10-CM | POA: Insufficient documentation

## 2016-01-28 ENCOUNTER — Other Ambulatory Visit: Payer: Self-pay | Admitting: Physician Assistant

## 2016-01-30 ENCOUNTER — Other Ambulatory Visit: Payer: Self-pay

## 2016-01-30 MED ORDER — APIXABAN 5 MG PO TABS: 5.0000 mg | ORAL_TABLET | Freq: Two times a day (BID) | ORAL | 3 refills | Status: DC

## 2016-01-30 MED ORDER — DILTIAZEM HCL ER COATED BEADS 120 MG PO CP24: 120.0000 mg | ORAL_CAPSULE | Freq: Every day | ORAL | 3 refills | Status: DC

## 2016-03-24 ENCOUNTER — Other Ambulatory Visit (INDEPENDENT_AMBULATORY_CARE_PROVIDER_SITE_OTHER): Payer: Medicare Other

## 2016-03-24 ENCOUNTER — Other Ambulatory Visit: Payer: Self-pay | Admitting: Family Medicine

## 2016-03-24 DIAGNOSIS — D751 Secondary polycythemia: Secondary | ICD-10-CM | POA: Diagnosis not present

## 2016-03-24 DIAGNOSIS — N183 Chronic kidney disease, stage 3 unspecified: Secondary | ICD-10-CM

## 2016-03-24 LAB — CBC WITH DIFFERENTIAL/PLATELET
Basophils Absolute: 0 10*3/uL (ref 0.0–0.1)
Basophils Relative: 0.6 % (ref 0.0–3.0)
EOS ABS: 0.1 10*3/uL (ref 0.0–0.7)
Eosinophils Relative: 2.1 % (ref 0.0–5.0)
HCT: 46 % (ref 36.0–46.0)
HEMOGLOBIN: 15.4 g/dL — AB (ref 12.0–15.0)
Lymphocytes Relative: 29.2 % (ref 12.0–46.0)
Lymphs Abs: 2 10*3/uL (ref 0.7–4.0)
MCHC: 33.5 g/dL (ref 30.0–36.0)
MCV: 97 fl (ref 78.0–100.0)
MONO ABS: 0.6 10*3/uL (ref 0.1–1.0)
Monocytes Relative: 8.2 % (ref 3.0–12.0)
Neutro Abs: 4.1 10*3/uL (ref 1.4–7.7)
Neutrophils Relative %: 59.9 % (ref 43.0–77.0)
Platelets: 248 10*3/uL (ref 150.0–400.0)
RBC: 4.74 Mil/uL (ref 3.87–5.11)
RDW: 14.7 % (ref 11.5–15.5)
WBC: 6.8 10*3/uL (ref 4.0–10.5)

## 2016-03-24 LAB — COMPREHENSIVE METABOLIC PANEL
ALBUMIN: 4.3 g/dL (ref 3.5–5.2)
ALK PHOS: 92 U/L (ref 39–117)
ALT: 15 U/L (ref 0–35)
AST: 16 U/L (ref 0–37)
BILIRUBIN TOTAL: 0.7 mg/dL (ref 0.2–1.2)
BUN: 22 mg/dL (ref 6–23)
CO2: 31 mEq/L (ref 19–32)
CREATININE: 1.13 mg/dL (ref 0.40–1.20)
Calcium: 9.9 mg/dL (ref 8.4–10.5)
Chloride: 104 mEq/L (ref 96–112)
GFR: 49.58 mL/min — ABNORMAL LOW (ref >60.00)
Glucose, Bld: 115 mg/dL — ABNORMAL HIGH (ref 70–99)
Potassium: 4.7 mEq/L (ref 3.5–5.1)
SODIUM: 142 meq/L (ref 135–145)
TOTAL PROTEIN: 7.8 g/dL (ref 6.0–8.3)

## 2016-03-25 LAB — PATHOLOGIST SMEAR REVIEW

## 2016-03-26 ENCOUNTER — Ambulatory Visit (HOSPITAL_COMMUNITY)
Admission: RE | Admit: 2016-03-26 | Discharge: 2016-03-26 | Disposition: A | Discharge status: Home / Self Care | Payer: Medicare Other | Source: Ambulatory Visit | Attending: Cardiology | Admitting: Cardiology

## 2016-03-26 ENCOUNTER — Encounter (HOSPITAL_COMMUNITY): Payer: Self-pay

## 2016-03-26 DIAGNOSIS — I779 Disorder of arteries and arterioles, unspecified: Secondary | ICD-10-CM | POA: Insufficient documentation

## 2016-03-26 DIAGNOSIS — I6523 Occlusion and stenosis of bilateral carotid arteries: Secondary | ICD-10-CM | POA: Diagnosis not present

## 2016-03-26 DIAGNOSIS — I739 Peripheral vascular disease, unspecified: Secondary | ICD-10-CM

## 2016-03-27 ENCOUNTER — Encounter: Payer: Self-pay | Admitting: Physician Assistant

## 2016-03-27 ENCOUNTER — Other Ambulatory Visit: Payer: Self-pay

## 2016-03-27 DIAGNOSIS — Z1231 Encounter for screening mammogram for malignant neoplasm of breast: Secondary | ICD-10-CM

## 2016-03-28 ENCOUNTER — Telehealth: Payer: Self-pay | Admitting: Interventional Cardiology

## 2016-03-28 NOTE — Telephone Encounter (Signed)
Follow Up:    Returning call,concernng her lab results,she did not have a name of who called.

## 2016-03-31 NOTE — Telephone Encounter (Signed)
Returned pts call.   We discussed her Vas US Carotid results and pt verbalized appreciation and understanding.

## 2016-04-17 ENCOUNTER — Ambulatory Visit (INDEPENDENT_AMBULATORY_CARE_PROVIDER_SITE_OTHER): Payer: Medicare Other | Admitting: Interventional Cardiology

## 2016-04-17 ENCOUNTER — Encounter: Payer: Self-pay | Admitting: Interventional Cardiology

## 2016-04-17 VITALS — BP 110/80 | HR 81 | Ht 62.0 in | Wt 152.1 lb

## 2016-04-17 DIAGNOSIS — I480 Paroxysmal atrial fibrillation: Secondary | ICD-10-CM | POA: Diagnosis not present

## 2016-04-17 DIAGNOSIS — I158 Other secondary hypertension: Secondary | ICD-10-CM

## 2016-04-17 DIAGNOSIS — I779 Disorder of arteries and arterioles, unspecified: Secondary | ICD-10-CM

## 2016-04-17 DIAGNOSIS — I119 Hypertensive heart disease without heart failure: Secondary | ICD-10-CM | POA: Diagnosis not present

## 2016-04-17 DIAGNOSIS — I739 Peripheral vascular disease, unspecified: Secondary | ICD-10-CM

## 2016-04-17 NOTE — Progress Notes (Signed)
Patient ID: [name], female   DOB: Oct 03, 1940, 75 y.o.   MRN: [id number]     Cardiology Office Note   Date:  04/17/2016   ID:  [name], DOB 1940/09/18, MRN [id number]  PCP:  Ria Bush, MD    No chief complaint on file. f/u carotid artery disease   Wt Readings from Last 3 Encounters:  04/17/16 152 lb 1.9 oz (69 kg)  01/24/16 144 lb 6.4 oz (65.5 kg)  01/02/16 147 lb (66.7 kg)       History of Present Illness: [name] is a 75 y.o. female  who has had a right carotid stent after restenosis post CEA. She saw Dr. Edd Arbour at [facility], now at [facility]. She had been following with him for a while but came to me for local care. SHe has not had heart probelms. She has had a stress test many years ago which was negative. She had a left carotid stent done in 1/14.  Hypertension:  No focal weakness.  Silver sneakers program for exercise.  SHe plays golf as well.  Goes to gym daily. Denies : Chest pain. Dizziness. Leg edema. Orthopnea. Paroxysmal nocturnal dyspnea. Palpitations. Shortness of breath.  Blurry vision.  Syncope.   No focal weakness.  Her husband passed away in 02-23-2023. She has been losing weight. She is eating well. Daughter lives in [place], and sees my sister in law for a PMD.   No recent lightheadedness or syncope.   The patient has joined the [employer]. She walks there and does some exercise classes-hip issues have resolved.       She was diagnosed with AFib earlier in 2017.  She was asymptomatic.  She has no bleeding problems after starting Eliquis.  She continues to go to the gym.  SHe feels well.      Past Medical History:  Diagnosis Date  . Arthritis    hands, knees, ankles  . BCC (basal cell carcinoma of skin) 2016   Stinehelfer  . Bilateral carotid artery stenosis    a. s/p right carotid stent after restenosis post CEA, left carotid stent (previously on Plavix for these). b. Carotid US 03/2016: stable 40-59% BICA.  Marland Kitchen Fibrocystic breast disease     followed by surgery Jamal Collin)  . GERD (gastroesophageal reflux disease)    OTC prilosec  . Hernia 2005  . History of chicken pox   . History of colon polyps   . History of melanoma 1990s   right shoulder  . HLD (hyperlipidemia)   . HTN (hypertension) 1970s  . Hypertensive retinopathy of both eyes, grade 1 2017   Bulakowski  . Kidney cyst, acquired 2016   large R septated 8cm, stable 2017  . Melanoma in situ ([place]) 2017   R medial posterior arm - lentio maligna type, margin involved ([place])  . PAF (paroxysmal atrial fibrillation) ([place])    a. dx 12/2015 at routine physical.    Past Surgical History:  Procedure Laterality Date  . ABI  2007   R 1.19, L 1.2  . APPENDECTOMY  1995  . BREAST BIOPSY Left 1997   left  . BREAST CYST ASPIRATION Left 02/1997   fna  . BREAST EXCISIONAL BIOPSY Right 1998   right  . CARDIOVASCULAR STRESS TEST  ~2003   WNL  . CAROTID ENDARTERECTOMY  2002   bilateral  . CAROTID STENT Right 2002   right after restenosis post CEA (Dr. Edd Arbour)  . CAROTID STENT  2014   for  stenosis  . CAROTID STENT Left 2014   Dr. Ardeth Sportsman, [place]  . CESAREAN SECTION  1968; 1972  . COLONOSCOPY  03/2012   Sankar - polyp (rec rpt 5 yrs)  . EXPLORATORY LAPAROTOMY  1990s   for GI pain, inflamed lower GI, unrevealing workup, improved with abx  . HERNIA REPAIR  2005   abdomen  . MELANOMA EXCISION  1998   right shoulder  . TOTAL HIP ARTHROPLASTY  2005   bilat     Current Outpatient Prescriptions  Medication Sig Dispense Refill  . apixaban (ELIQUIS) 5 MG TABS tablet Take 1 tablet (5 mg total) by mouth 2 (two) times daily. 180 tablet 3  . atorvastatin (LIPITOR) 40 MG tablet Take 1 tablet (40 mg total) by mouth at bedtime. 90 tablet 3  . calcium-vitamin D (OSCAL WITH D) 500-200 MG-UNIT per tablet Take 1 tablet by mouth daily.    Marland Kitchen diltiazem (CARDIZEM CD) 120 MG 24 hr capsule Take 1 capsule (120 mg total) by mouth daily. 90 capsule 3  . enalapril (VASOTEC) 5 MG tablet  Take 1 tablet (5 mg total) by mouth daily. 90 tablet 3  . fluticasone (FLONASE) 50 MCG/ACT nasal spray Place 2 sprays into both nostrils daily as needed for allergies or rhinitis.    . metoprolol succinate (TOPROL-XL) 100 MG 24 hr tablet Take 1 tablet (100 mg total) by mouth daily. 90 tablet 3  . Multiple Vitamin (MULTIVITAMIN) capsule Take 1 capsule by mouth daily.    Marland Kitchen triamcinolone cream (KENALOG) 0.1 % Apply 1 application topically daily as needed (skin cancer).    . FLUZONE HIGH-DOSE 0.5 ML SUSY TO BE ADMINISTERED BY PHARMACIST FOR IMMUNIZATION  0   No current facility-administered medications for this visit.     Allergies:   Patient has no known allergies.    Social History:  The patient  reports that she has quit smoking. She has a 30.00 pack-year smoking history. She has never used smokeless tobacco. She reports that she drinks alcohol. She reports that she does not use drugs.   Family History:  The patient's *family history includes Cancer (age of onset: 16) in her father; Cancer (age of onset: 10) in her mother; Hypertension in her paternal grandfather.    ROS:  Please see the history of present illness.   Otherwise, review of systems are positive for high BP today; usually normal range.   All other systems are reviewed and negative.    PHYSICAL EXAM: VS:  BP 110/80   Pulse 81   Ht 5\' 2"  (1.575 m)   Wt 152 lb 1.9 oz (69 kg)   BMI 27.82 kg/m  , BMI Body mass index is 27.82 kg/m. GEN: Well nourished, well developed, in no acute distress  HEENT: normal  Neck: no JVD, ; bilateral carotid bruits, ; no masses Cardiac: RRR; no murmurs, rubs, or gallops,; tr pretibial edema  Respiratory:  clear to auscultation bilaterally, normal work of breathing GI: soft, nontender, nondistended, + BS MS: no deformity or atrophy  Skin: warm and dry, no rash Neuro:  Strength and sensation are intact Psych: euthymic mood, full affect   EKG:   The ekg ordered today demonstrates sinus rhythm  in September 2017   Recent Labs: 01/02/2016: TSH 2.37 03/24/2016: ALT 15; BUN 22; Creatinine, Ser 1.13; Hemoglobin 15.4; Platelets 248.0; Potassium 4.7; Sodium 142   Lipid Panel    Component Value Date/Time   CHOL 129 12/24/2015 0908   TRIG 109.0 12/24/2015 0908  TRIG 144 03/28/2011   HDL 45.30 12/24/2015 0908   CHOLHDL 3 12/24/2015 0908   VLDL 21.8 12/24/2015 0908   LDLCALC 62 12/24/2015 0908   LDLDIRECT 78 03/28/2011     Other studies Reviewed: Additional studies/ records that were reviewed today with results demonstrating: Moderately dilated left atrium. Normal left ventricular function. Mild MR, mild AI from echo in August 2017.   ASSESSMENT AND PLAN:  1. Carotid artery disease:  Off antiplatelet therapy.  COntinue regular carotid Dopplers. Study from November 2017 was reviewed and showed only moderate disease bilaterally.  THis was stable.   2. Hyperlipidemia: LDL controlled. Continue lipid-lowering therapy. 3. Hypertensive heart disease: Blood pressures typically well controlled at home. Continue current antihypertensive therapy.  Needs q 6 month CBC and BMet for Eliquis. 4. AFib: Now on Eliquis for stroke prevention. She did revert back to sinus after the initial diagnosis of atrial fibrillation. Given her chads Vasc score, she does benefit from anticoagulation. This was the reason for stopping her antiplatelet therapy This patients CHA2DS2-VASc Score and unadjusted Ischemic Stroke Rate (% per year) is equal to 3.2 % stroke rate/year from a score of 3  Above score calculated as 1 point each if present [CHF, HTN, DM, Vascular=MI/PAD/Aortic Plaque, Age if 65-74, or Female] Above score calculated as 2 points each if present [Age > 75, or Stroke/TIA/TE]    Current medicines are reviewed at length with the patient today.  The patient concerns regarding her medicines were addressed.  The following changes have been made:  No change  Labs/ tests ordered today include:  No  orders of the defined types were placed in this encounter.   Recommend 150 minutes/week of aerobic exercise Low fat, low carb, high fiber diet recommended  Disposition:   FU in 1 yr   Signed, Larae Grooms, MD  04/17/2016 3:06 PM    [facility] [address], [place], [place]  [address] Phone: [phone]; Fax: [phone]

## 2016-04-17 NOTE — Patient Instructions (Signed)
**Note De-Identified Genova Kiner Obfuscation** Medication Instructions:  Same-no changes  Labwork: CBC and BMET in Sep 15, 2016 anytime between 7:30 and 5:00.  Testing/Procedures: None  Follow-Up: Your physician wants you to follow-up in: 1 year. You will receive a reminder letter in the mail two months in advance. If you don't receive a letter, please call our office to schedule the follow-up appointment.     If you need a refill on your cardiac medications before your next appointment, please call your pharmacy.

## 2016-04-18 DIAGNOSIS — I119 Hypertensive heart disease without heart failure: Secondary | ICD-10-CM | POA: Insufficient documentation

## 2016-05-15 DIAGNOSIS — L853 Xerosis cutis: Secondary | ICD-10-CM | POA: Diagnosis not present

## 2016-05-15 DIAGNOSIS — D0372 Melanoma in situ of left lower limb, including hip: Secondary | ICD-10-CM | POA: Diagnosis not present

## 2016-05-15 DIAGNOSIS — Z85828 Personal history of other malignant neoplasm of skin: Secondary | ICD-10-CM | POA: Diagnosis not present

## 2016-05-15 DIAGNOSIS — D0362 Melanoma in situ of left upper limb, including shoulder: Secondary | ICD-10-CM | POA: Diagnosis not present

## 2016-05-15 DIAGNOSIS — L570 Actinic keratosis: Secondary | ICD-10-CM | POA: Diagnosis not present

## 2016-05-26 ENCOUNTER — Ambulatory Visit
Admission: RE | Admit: 2016-05-26 | Discharge: 2016-05-26 | Disposition: A | Discharge status: Home / Self Care | Payer: Medicare Other | Source: Ambulatory Visit | Attending: General Surgery | Admitting: General Surgery

## 2016-05-26 ENCOUNTER — Other Ambulatory Visit: Payer: Self-pay | Admitting: General Surgery

## 2016-05-26 DIAGNOSIS — Z1231 Encounter for screening mammogram for malignant neoplasm of breast: Secondary | ICD-10-CM

## 2016-06-03 ENCOUNTER — Encounter: Payer: Self-pay | Admitting: General Surgery

## 2016-06-03 ENCOUNTER — Ambulatory Visit (INDEPENDENT_AMBULATORY_CARE_PROVIDER_SITE_OTHER): Payer: Medicare Other | Admitting: General Surgery

## 2016-06-03 VITALS — BP 122/68 | HR 70 | Resp 12 | Ht 62.0 in | Wt 151.0 lb

## 2016-06-03 DIAGNOSIS — K432 Incisional hernia without obstruction or gangrene: Secondary | ICD-10-CM

## 2016-06-03 DIAGNOSIS — Z1231 Encounter for screening mammogram for malignant neoplasm of breast: Secondary | ICD-10-CM | POA: Diagnosis not present

## 2016-06-03 DIAGNOSIS — Z85820 Personal history of malignant melanoma of skin: Secondary | ICD-10-CM

## 2016-06-03 DIAGNOSIS — N6019 Diffuse cystic mastopathy of unspecified breast: Secondary | ICD-10-CM | POA: Diagnosis not present

## 2016-06-03 NOTE — Patient Instructions (Addendum)
The patient has been asked to return to the office in one year with a bilateral screening mammogram with Dr. Byrnett.  

## 2016-06-03 NOTE — Progress Notes (Addendum)
Patient ID: [name], female   DOB: [birth year], 76 y.o.   MRN: [id number]  Chief Complaint  Patient presents with  . Follow-up    HPI [name] is a 76 y.o. female who presents for a breast evaluation. The most recent mammogram was done on 05/26/2016.  Patient does perform regular self breast checks and gets regular mammograms done.  She is having two new melanomas removed removed on Thursday.  I have reviewed the history of present illness with the patient.  HPI  Past Medical History:  Diagnosis Date  . Arthritis    hands, knees, ankles  . BCC (basal cell carcinoma of skin) 2016   Stinehelfer  . Bilateral carotid artery stenosis    a. s/p right carotid stent after restenosis post CEA, left carotid stent (previously on Plavix for these). b. Carotid US 03/2016: stable 40-59% BICA.  Marland Kitchen Fibrocystic breast disease    followed by surgery Jamal Collin)  . GERD (gastroesophageal reflux disease)    OTC prilosec  . Hernia 2005  . History of chicken pox   . History of colon polyps   . History of melanoma 1990s   right shoulder  . HLD (hyperlipidemia)   . HTN (hypertension) 1970s  . Hypertensive retinopathy of both eyes, grade 1 2017   Bulakowski  . Kidney cyst, acquired 2016   large R septated 8cm, stable 2017  . Melanoma in situ ([place]) 2017   R medial posterior arm - lentio maligna type, margin involved ([place])  . PAF (paroxysmal atrial fibrillation) ([place])    a. dx 12/2015 at routine physical.    Past Surgical History:  Procedure Laterality Date  . ABI  2007   R 1.19, L 1.2  . APPENDECTOMY  1995  . BREAST BIOPSY Left 1997   left  . BREAST CYST ASPIRATION Left 02/1997   fna  . BREAST EXCISIONAL BIOPSY Right 1998   right  . CARDIOVASCULAR STRESS TEST  ~2003   WNL  . CAROTID ENDARTERECTOMY  2002   bilateral  . CAROTID STENT Right 2002   right after restenosis post CEA (Dr. Edd Arbour)  . CAROTID STENT  2014   for stenosis  . CAROTID STENT Left 2014   Dr. Ardeth Sportsman, [place]  . CESAREAN SECTION  1968; 1972  . COLONOSCOPY  03/2012   Sankar - polyp (rec rpt 5 yrs)  . EXPLORATORY LAPAROTOMY  1990s   for GI pain, inflamed lower GI, unrevealing workup, improved with abx  . HERNIA REPAIR  2005   abdomen  . MELANOMA EXCISION  1998   right shoulder  . TOTAL HIP ARTHROPLASTY  2005   bilat    Family History  Problem Relation Age of Onset  . Cancer Mother 66    lung, smoker  . Cancer Father 45    prostate  . Hypertension Paternal Grandfather   . CAD Neg Hx   . Stroke Neg Hx   . Diabetes Neg Hx   . Heart attack Neg Hx     Social History Social History  Substance Use Topics  . Smoking status: Former Smoker    Packs/day: 1.00    Years: 30.00  . Smokeless tobacco: Never Used  . Alcohol use 0.0 oz/week     Comment: 1 glass wine daily    No Known Allergies  Current Outpatient Prescriptions  Medication Sig Dispense Refill  . apixaban (ELIQUIS) 5 MG TABS tablet Take 1 tablet (5 mg total) by mouth 2 (two) times daily.  180 tablet 3  . atorvastatin (LIPITOR) 40 MG tablet Take 1 tablet (40 mg total) by mouth at bedtime. 90 tablet 3  . calcium-vitamin D (OSCAL WITH D) 500-200 MG-UNIT per tablet Take 1 tablet by mouth daily.    Marland Kitchen diltiazem (CARDIZEM CD) 120 MG 24 hr capsule Take 1 capsule (120 mg total) by mouth daily. 90 capsule 3  . enalapril (VASOTEC) 5 MG tablet Take 1 tablet (5 mg total) by mouth daily. 90 tablet 3  . fluticasone (FLONASE) 50 MCG/ACT nasal spray Place 2 sprays into both nostrils daily as needed for allergies or rhinitis.    Marland Kitchen FLUZONE HIGH-DOSE 0.5 ML SUSY TO BE ADMINISTERED BY PHARMACIST FOR IMMUNIZATION  0  . metoprolol succinate (TOPROL-XL) 100 MG 24 hr tablet Take 1 tablet (100 mg total) by mouth daily. 90 tablet 3  . Multiple Vitamin (MULTIVITAMIN) capsule Take 1 capsule by mouth daily.    Marland Kitchen triamcinolone cream (KENALOG) 0.1 % Apply 1 application topically daily as needed (skin cancer).     No current facility-administered  medications for this visit.     Review of Systems Review of Systems  Constitutional: Negative.   Respiratory: Negative.   Cardiovascular: Negative.     Blood pressure 122/68, pulse 70, resp. rate 12, height 5\' 2"  (1.575 m), weight 151 lb (68.5 kg).  Physical Exam Physical Exam  Constitutional: She is oriented to person, place, and time. She appears well-developed and well-nourished.  Eyes: Conjunctivae are normal. No scleral icterus.  Neck: Neck supple.  Cardiovascular: Normal rate, regular rhythm and normal heart sounds.   Pulmonary/Chest: Effort normal and breath sounds normal. Right breast exhibits no inverted nipple, no mass, no nipple discharge, no skin change and no tenderness. Left breast exhibits no inverted nipple, no mass, no nipple discharge, no skin change and no tenderness.  Abdominal: Soft. Normal appearance and bowel sounds are normal. There is no hepatomegaly. There is no tenderness. A hernia is present.    Lymphadenopathy:    She has no cervical adenopathy.    She has no axillary adenopathy.  Neurological: She is alert and oriented to person, place, and time.  Skin: Skin is warm.       Data Reviewed Mammogram reviewed   Assessment    Fibrocystic disease. History of melanoma-right shoulder. New melanomas in left forearm and left lower leg.     Incisional hernia -stable and asymptomatic  Plan    Right renal cyst is stable and being followed by her PCP.   The patient has been asked to return to the office in one year with a bilateral screening mammogram.  This information has been scribed by Gaspar Cola CMA.   SANKAR,SEEPLAPUTHUR G 07/09/2016, 8:15 AM

## 2016-06-05 ENCOUNTER — Ambulatory Visit: Payer: Self-pay | Admitting: General Surgery

## 2016-06-05 DIAGNOSIS — C4372 Malignant melanoma of left lower limb, including hip: Secondary | ICD-10-CM | POA: Diagnosis not present

## 2016-06-12 DIAGNOSIS — D0362 Melanoma in situ of left upper limb, including shoulder: Secondary | ICD-10-CM | POA: Diagnosis not present

## 2016-06-12 DIAGNOSIS — C4362 Malignant melanoma of left upper limb, including shoulder: Secondary | ICD-10-CM | POA: Diagnosis not present

## 2016-08-05 DIAGNOSIS — H04123 Dry eye syndrome of bilateral lacrimal glands: Secondary | ICD-10-CM | POA: Diagnosis not present

## 2016-08-05 DIAGNOSIS — I10 Essential (primary) hypertension: Secondary | ICD-10-CM | POA: Diagnosis not present

## 2016-08-05 DIAGNOSIS — H2513 Age-related nuclear cataract, bilateral: Secondary | ICD-10-CM | POA: Diagnosis not present

## 2016-08-05 DIAGNOSIS — H524 Presbyopia: Secondary | ICD-10-CM | POA: Diagnosis not present

## 2016-08-05 DIAGNOSIS — H35033 Hypertensive retinopathy, bilateral: Secondary | ICD-10-CM | POA: Diagnosis not present

## 2016-08-12 DIAGNOSIS — D692 Other nonthrombocytopenic purpura: Secondary | ICD-10-CM | POA: Diagnosis not present

## 2016-08-12 DIAGNOSIS — Z85828 Personal history of other malignant neoplasm of skin: Secondary | ICD-10-CM | POA: Diagnosis not present

## 2016-08-12 DIAGNOSIS — Z85820 Personal history of malignant melanoma of skin: Secondary | ICD-10-CM | POA: Diagnosis not present

## 2016-08-12 DIAGNOSIS — L821 Other seborrheic keratosis: Secondary | ICD-10-CM | POA: Diagnosis not present

## 2016-08-12 DIAGNOSIS — L570 Actinic keratosis: Secondary | ICD-10-CM | POA: Diagnosis not present

## 2016-09-15 ENCOUNTER — Other Ambulatory Visit: Payer: Medicare Other

## 2016-09-15 ENCOUNTER — Other Ambulatory Visit (INDEPENDENT_AMBULATORY_CARE_PROVIDER_SITE_OTHER): Payer: Medicare Other

## 2016-09-15 DIAGNOSIS — I480 Paroxysmal atrial fibrillation: Secondary | ICD-10-CM

## 2016-09-15 DIAGNOSIS — I158 Other secondary hypertension: Secondary | ICD-10-CM | POA: Diagnosis not present

## 2016-09-15 LAB — CBC WITH DIFFERENTIAL/PLATELET
Basophils Absolute: 60 cells/uL (ref 0–200)
Basophils Relative: 1 %
EOS PCT: 2 %
Eosinophils Absolute: 120 cells/uL (ref 15–500)
HCT: 45.3 % — ABNORMAL HIGH (ref 35.0–45.0)
HEMOGLOBIN: 14.7 g/dL (ref 11.7–15.5)
LYMPHS ABS: 2100 {cells}/uL (ref 850–3900)
Lymphocytes Relative: 35 %
MCH: 31.4 pg (ref 27.0–33.0)
MCHC: 32.5 g/dL (ref 32.0–36.0)
MCV: 96.8 fL (ref 80.0–100.0)
MPV: 11.2 fL (ref 7.5–12.5)
Monocytes Absolute: 540 cells/uL (ref 200–950)
Monocytes Relative: 9 %
NEUTROS ABS: 3180 {cells}/uL (ref 1500–7800)
NEUTROS PCT: 53 %
PLATELETS: 208 10*3/uL (ref 140–400)
RBC: 4.68 MIL/uL (ref 3.80–5.10)
RDW: 15.6 % — ABNORMAL HIGH (ref 11.0–15.0)
WBC: 6 10*3/uL (ref 3.8–10.8)

## 2016-09-16 LAB — BASIC METABOLIC PANEL WITH GFR
BUN: 21 mg/dL (ref 7–25)
CO2: 24 mmol/L (ref 20–31)
Calcium: 9.5 mg/dL (ref 8.6–10.4)
Chloride: 104 mmol/L (ref 98–110)
Creat: 1.1 mg/dL — ABNORMAL HIGH (ref 0.60–0.93)
Glucose, Bld: 70 mg/dL (ref 65–99)
Potassium: 4.7 mmol/L (ref 3.5–5.3)
Sodium: 141 mmol/L (ref 135–146)

## 2016-10-10 DIAGNOSIS — D1801 Hemangioma of skin and subcutaneous tissue: Secondary | ICD-10-CM | POA: Diagnosis not present

## 2016-10-10 DIAGNOSIS — Z85828 Personal history of other malignant neoplasm of skin: Secondary | ICD-10-CM | POA: Diagnosis not present

## 2016-10-10 DIAGNOSIS — L821 Other seborrheic keratosis: Secondary | ICD-10-CM | POA: Diagnosis not present

## 2016-10-10 DIAGNOSIS — Z85820 Personal history of malignant melanoma of skin: Secondary | ICD-10-CM | POA: Diagnosis not present

## 2016-10-10 DIAGNOSIS — L570 Actinic keratosis: Secondary | ICD-10-CM | POA: Diagnosis not present

## 2016-10-29 ENCOUNTER — Other Ambulatory Visit: Payer: Self-pay | Admitting: Interventional Cardiology

## 2016-10-29 ENCOUNTER — Other Ambulatory Visit: Payer: Self-pay | Admitting: Family Medicine

## 2016-10-29 MED ORDER — METOPROLOL SUCCINATE ER 100 MG PO TB24: 100.0000 mg | ORAL_TABLET | Freq: Every day | ORAL | 1 refills | Status: DC

## 2016-10-29 NOTE — Telephone Encounter (Signed)
Pt saw Dr Irish Lack in 04/2016. Wt 68.5Kg, age 76 yrs old. SCr from 09/2016 was 1.10. Will refill Eliquis 5mg  BID.

## 2017-01-14 DIAGNOSIS — Z85820 Personal history of malignant melanoma of skin: Secondary | ICD-10-CM | POA: Diagnosis not present

## 2017-01-14 DIAGNOSIS — Z85828 Personal history of other malignant neoplasm of skin: Secondary | ICD-10-CM | POA: Diagnosis not present

## 2017-01-14 DIAGNOSIS — D225 Melanocytic nevi of trunk: Secondary | ICD-10-CM | POA: Diagnosis not present

## 2017-01-14 DIAGNOSIS — L821 Other seborrheic keratosis: Secondary | ICD-10-CM | POA: Diagnosis not present

## 2017-01-14 DIAGNOSIS — L570 Actinic keratosis: Secondary | ICD-10-CM | POA: Diagnosis not present

## 2017-02-04 ENCOUNTER — Other Ambulatory Visit: Payer: Self-pay | Admitting: Family Medicine

## 2017-02-04 ENCOUNTER — Other Ambulatory Visit: Payer: Self-pay | Admitting: Interventional Cardiology

## 2017-02-04 ENCOUNTER — Other Ambulatory Visit: Payer: Self-pay

## 2017-02-04 MED ORDER — METOPROLOL SUCCINATE ER 100 MG PO TB24: 100.0000 mg | ORAL_TABLET | Freq: Every day | ORAL | 0 refills | Status: DC

## 2017-02-06 ENCOUNTER — Other Ambulatory Visit: Payer: Self-pay | Admitting: *Deleted

## 2017-02-06 MED ORDER — APIXABAN 5 MG PO TABS: 5.0000 mg | ORAL_TABLET | Freq: Two times a day (BID) | ORAL | 1 refills | Status: DC

## 2017-03-04 ENCOUNTER — Other Ambulatory Visit: Payer: Self-pay | Admitting: *Deleted

## 2017-03-04 DIAGNOSIS — I6523 Occlusion and stenosis of bilateral carotid arteries: Secondary | ICD-10-CM

## 2017-03-25 ENCOUNTER — Other Ambulatory Visit: Payer: Self-pay | Admitting: Interventional Cardiology

## 2017-03-26 ENCOUNTER — Other Ambulatory Visit: Payer: Self-pay

## 2017-03-26 DIAGNOSIS — Z1231 Encounter for screening mammogram for malignant neoplasm of breast: Secondary | ICD-10-CM

## 2017-04-12 NOTE — Progress Notes (Signed)
Cardiology Office Note   Date:  04/14/2017   ID:  [name], DOB [birth year], MRN [id number]  PCP:  Ria Bush, MD    No chief complaint on file.  PAD  Wt Readings from Last 3 Encounters:  04/14/17 155 lb 9.6 oz (70.6 kg)  06/03/16 151 lb (68.5 kg)  04/17/16 152 lb 1.9 oz (69 kg)       History of Present Illness: [name] is a 76 y.o. female  who has had a right carotid stent after restenosis post CEA. She saw Dr. Edd Arbour at [facility], now at [facility]. She had been following with him for a while but came to me for local care. SHe has not had heart probelms. She has had a stress test many years ago which was negative. She had a left carotid stent done in 06-24-2022.  Her husband passed away in February 24, 2023. She is eating well. Daughter lives in [place], and sees my sister in law for a PMD.   She was diagnosed with AFib earlier in 2017.  She was asymptomatic.  She had no bleeding problems after starting Eliquis.  Since last visit, Denies : Chest pain. Dizziness. Leg edema. Nitroglycerin use. Orthopnea. Palpitations. Paroxysmal nocturnal dyspnea. Shortness of breath. Syncope.   No bleeding problems.  SHe does silver sneakers 5 days/week.  She works up to a pace that it makes her winded.  She plays some golf.    Past Medical History:  Diagnosis Date  . Arthritis    hands, knees, ankles  . BCC (basal cell carcinoma of skin) 2016   Stinehelfer  . Bilateral carotid artery stenosis    a. s/p right carotid stent after restenosis post CEA, left carotid stent (previously on Plavix for these). b. Carotid US 03/2016: stable 40-59% BICA.  Marland Kitchen Fibrocystic breast disease    followed by surgery Jamal Collin)  . GERD (gastroesophageal reflux disease)    OTC prilosec  . Hernia 2005  . History of chicken pox   . History of colon polyps   . History of melanoma 1990s   right shoulder  . HLD (hyperlipidemia)   . HTN (hypertension) 1970s  . Hypertensive retinopathy of both eyes, grade 1 2017   Bulakowski  . Kidney cyst, acquired 2016   large R septated 8cm, stable 2017  . Melanoma in situ ([place]) 2017   R medial posterior arm - lentio maligna type, margin involved ([place])  . PAF (paroxysmal atrial fibrillation) ([place])    a. dx 12/2015 at routine physical.    Past Surgical History:  Procedure Laterality Date  . ABI  2007   R 1.19, L 1.2  . APPENDECTOMY  1995  . BREAST BIOPSY Left 1997   left  . BREAST CYST ASPIRATION Left 02/1997   fna  . BREAST EXCISIONAL BIOPSY Right 1998   right  . CARDIOVASCULAR STRESS TEST  ~2003   WNL  . CAROTID ENDARTERECTOMY  2002   bilateral  . CAROTID STENT Right 2002   right after restenosis post CEA (Dr. Edd Arbour)  . CAROTID STENT  2014   for stenosis  . CAROTID STENT Left 2014   Dr. Ardeth Sportsman, [place]  . CESAREAN SECTION  1968; 1972  . COLONOSCOPY  03/2012   Sankar - polyp (rec rpt 5 yrs)  . EXPLORATORY LAPAROTOMY  1990s   for GI pain, inflamed lower GI, unrevealing workup, improved with abx  . HERNIA REPAIR  2005   abdomen  . [place]  right shoulder  . TOTAL HIP ARTHROPLASTY  2005   bilat     Current Outpatient Medications  Medication Sig Dispense Refill  . apixaban (ELIQUIS) 5 MG TABS tablet Take 1 tablet (5 mg total) by mouth 2 (two) times daily. 180 tablet 1  . atorvastatin (LIPITOR) 40 MG tablet TAKE 1 TABLET BY MOUTH AT  BEDTIME 90 tablet 0  . calcium-vitamin D (OSCAL WITH D) 500-200 MG-UNIT per tablet Take 1 tablet by mouth daily.    Marland Kitchen diltiazem (CARDIZEM CD) 120 MG 24 hr capsule TAKE 1 CAPSULE BY MOUTH  DAILY 90 capsule 2  . enalapril (VASOTEC) 5 MG tablet Take 1 tablet (5 mg total) by mouth daily. MUST SCHEDULE OFFICE VISIT FOR MORE REFILLS 90 tablet 0  . fluticasone (FLONASE) 50 MCG/ACT nasal spray Place 2 sprays into both nostrils daily as needed for allergies or rhinitis.    Marland Kitchen FLUZONE HIGH-DOSE 0.5 ML SUSY TO BE ADMINISTERED BY PHARMACIST FOR IMMUNIZATION  0  . metoprolol succinate (TOPROL-XL) 100 MG 24  hr tablet Take 1 tablet (100 mg total) by mouth daily. 90 tablet 0  . Multiple Vitamin (MULTIVITAMIN) capsule Take 1 capsule by mouth daily.    Marland Kitchen triamcinolone cream (KENALOG) 0.1 % Apply 1 application topically daily as needed (skin cancer).     No current facility-administered medications for this visit.     Allergies:   Patient has no known allergies.    Social History:  The patient  reports that she has quit smoking. She has a 30.00 pack-year smoking history. she has never used smokeless tobacco. She reports that she drinks alcohol. She reports that she does not use drugs.   Family History:  The patient's family history includes Cancer (age of onset: 68) in her father; Cancer (age of onset: 7) in her mother; Hypertension in her paternal grandfather.    ROS:  Please see the history of present illness.   Otherwise, review of systems are positive for trying to stay active; feeeling well.   All other systems are reviewed and negative.    PHYSICAL EXAM: VS:  BP 108/78   Pulse 88   Ht 5\' 2"  (1.575 m)   Wt 155 lb 9.6 oz (70.6 kg)   SpO2 98%   BMI 28.46 kg/m  , BMI Body mass index is 28.46 kg/m. GEN: Well nourished, well developed, in no acute distress  HEENT: normal  Neck: no JVD, carotid bruits, or masses Cardiac: irregularly irregular; no murmurs, rubs, or gallops,no edema  Respiratory:  clear to auscultation bilaterally, normal work of breathing GI: soft, nontender, nondistended, + BS MS: no deformity or atrophy  Skin: warm and dry, no rash Neuro:  Strength and sensation are intact Psych: euthymic mood, full affect   EKG:   The ekg ordered today demonstrates AFib, rate controlled   Recent Labs: 09/15/2016: BUN 21; Creat 1.10; Hemoglobin 14.7; Platelets 208; Potassium 4.7; Sodium 141   Lipid Panel    Component Value Date/Time   CHOL 129 12/24/2015 0908   TRIG 109.0 12/24/2015 0908   TRIG 144 03/28/2011   HDL 45.30 12/24/2015 0908   CHOLHDL 3 12/24/2015 0908   VLDL  21.8 12/24/2015 0908   LDLCALC 62 12/24/2015 0908   LDLDIRECT 78 03/28/2011     Other studies Reviewed: Additional studies/ records that were reviewed today with results demonstrating: Mild carotid disesae in 2017 by Doppler.   ASSESSMENT AND PLAN:  1. Carotid artery disease: Routine carotid Dopplers being followed. She has one  scheduled for later today. No neurologic symptoms reported. 2. AFib: Rate control. Stroke prevention with Eliquis. 3. Hypertensive heart disease: Blood pressure well controlled. Continue current medications. 4. Hyperlipidemia: LDL controlled.  She is due for lipid check. She will have this done at her primary care doctor's office since it is closer. Prescription given with all the labs we need done including CBC, CMet and lipids   Current medicines are reviewed at length with the patient today.  The patient concerns regarding her medicines were addressed.  The following changes have been made:  No change  Labs/ tests ordered today include:  No orders of the defined types were placed in this encounter.   Recommend 150 minutes/week of aerobic exercise Low fat, low carb, high fiber diet recommended  Disposition:   FU in one year   Signed, Larae Grooms, MD  04/14/2017 10:23 AM    [facility] [address], [place], [place]  [address] Phone: [phone]; Fax: [phone]

## 2017-04-14 ENCOUNTER — Encounter: Payer: Self-pay | Admitting: Interventional Cardiology

## 2017-04-14 ENCOUNTER — Ambulatory Visit (HOSPITAL_COMMUNITY)
Admission: RE | Admit: 2017-04-14 | Discharge: 2017-04-14 | Disposition: A | Discharge status: Home / Self Care | Payer: Medicare Other | Source: Ambulatory Visit | Attending: Cardiology | Admitting: Cardiology

## 2017-04-14 ENCOUNTER — Ambulatory Visit: Payer: Medicare Other | Admitting: Interventional Cardiology

## 2017-04-14 VITALS — BP 108/78 | HR 88 | Ht 62.0 in | Wt 155.6 lb

## 2017-04-14 DIAGNOSIS — I6523 Occlusion and stenosis of bilateral carotid arteries: Secondary | ICD-10-CM | POA: Diagnosis not present

## 2017-04-14 DIAGNOSIS — E785 Hyperlipidemia, unspecified: Secondary | ICD-10-CM | POA: Insufficient documentation

## 2017-04-14 DIAGNOSIS — I4891 Unspecified atrial fibrillation: Secondary | ICD-10-CM

## 2017-04-14 DIAGNOSIS — E782 Mixed hyperlipidemia: Secondary | ICD-10-CM | POA: Diagnosis not present

## 2017-04-14 DIAGNOSIS — I119 Hypertensive heart disease without heart failure: Secondary | ICD-10-CM | POA: Diagnosis not present

## 2017-04-14 DIAGNOSIS — I481 Persistent atrial fibrillation: Secondary | ICD-10-CM

## 2017-04-14 DIAGNOSIS — Z87891 Personal history of nicotine dependence: Secondary | ICD-10-CM | POA: Insufficient documentation

## 2017-04-14 DIAGNOSIS — I4819 Other persistent atrial fibrillation: Secondary | ICD-10-CM

## 2017-04-14 NOTE — Addendum Note (Signed)
Addended by: Drue Novel I on: 04/14/2017 01:48 PM   Modules accepted: Orders

## 2017-04-14 NOTE — Patient Instructions (Signed)
Medication Instructions:  Your physician recommends that you continue on your current medications as directed. Please refer to the Current Medication list given to you today.   Labwork: Please have your labs checked at your [place] office and have them fax the results to our office. Labs needed: complete blood count, comprehensive metabolic panel, and FASTING lipid panel  Testing/Procedures: None ordered  Follow-Up: Your physician wants you to follow-up in: 1 year with Dr. Irish Lack. You will receive a reminder letter in the mail two months in advance. If you don't receive a letter, please call our office to schedule the follow-up appointment.   Any Other Special Instructions Will Be Listed Below (If Applicable).     If you need a refill on your cardiac medications before your next appointment, please call your pharmacy.

## 2017-04-15 ENCOUNTER — Other Ambulatory Visit (INDEPENDENT_AMBULATORY_CARE_PROVIDER_SITE_OTHER): Payer: Medicare Other

## 2017-04-15 DIAGNOSIS — E782 Mixed hyperlipidemia: Secondary | ICD-10-CM

## 2017-04-15 DIAGNOSIS — I4891 Unspecified atrial fibrillation: Secondary | ICD-10-CM | POA: Diagnosis not present

## 2017-04-15 LAB — COMPREHENSIVE METABOLIC PANEL
ALBUMIN: 4.6 g/dL (ref 3.5–5.2)
ALT: 23 U/L (ref 0–35)
AST: 19 U/L (ref 0–37)
Alkaline Phosphatase: 96 U/L (ref 39–117)
BILIRUBIN TOTAL: 0.9 mg/dL (ref 0.2–1.2)
BUN: 19 mg/dL (ref 6–23)
CHLORIDE: 102 meq/L (ref 96–112)
CO2: 30 meq/L (ref 19–32)
CREATININE: 1.06 mg/dL (ref 0.40–1.20)
Calcium: 9.6 mg/dL (ref 8.4–10.5)
GFR: 53.22 mL/min — ABNORMAL LOW (ref >60.00)
Glucose, Bld: 105 mg/dL — ABNORMAL HIGH (ref 70–99)
Potassium: 4.5 mEq/L (ref 3.5–5.1)
SODIUM: 139 meq/L (ref 135–145)
Total Protein: 8 g/dL (ref 6.0–8.3)

## 2017-04-15 LAB — CBC
HCT: 49.1 % — ABNORMAL HIGH (ref 36.0–46.0)
Hemoglobin: 16.2 g/dL — ABNORMAL HIGH (ref 12.0–15.0)
MCHC: 32.9 g/dL (ref 30.0–36.0)
MCV: 100.1 fl — AB (ref 78.0–100.0)
Platelets: 209 10*3/uL (ref 150.0–400.0)
RBC: 4.9 Mil/uL (ref 3.87–5.11)
RDW: 15.3 % (ref 11.5–15.5)
WBC: 5.6 10*3/uL (ref 4.0–10.5)

## 2017-04-15 LAB — LIPID PANEL
CHOL/HDL RATIO: 3
CHOLESTEROL: 133 mg/dL (ref 0–200)
HDL: 42.6 mg/dL (ref >39.00)
LDL CALC: 68 mg/dL (ref 0–99)
NonHDL: 90.76
Triglycerides: 115 mg/dL (ref 0.0–149.0)
VLDL: 23 mg/dL (ref 0.0–40.0)

## 2017-04-15 NOTE — Addendum Note (Signed)
Addended by: Ellamae Sia on: 04/15/2017 07:43 AM   Modules accepted: Orders

## 2017-04-16 ENCOUNTER — Telehealth: Payer: Self-pay | Admitting: Physician Assistant

## 2017-04-16 ENCOUNTER — Telehealth: Payer: Self-pay | Admitting: Interventional Cardiology

## 2017-04-16 DIAGNOSIS — L430 Hypertrophic lichen planus: Secondary | ICD-10-CM | POA: Diagnosis not present

## 2017-04-16 DIAGNOSIS — L853 Xerosis cutis: Secondary | ICD-10-CM | POA: Diagnosis not present

## 2017-04-16 DIAGNOSIS — L570 Actinic keratosis: Secondary | ICD-10-CM | POA: Diagnosis not present

## 2017-04-16 DIAGNOSIS — Z85820 Personal history of malignant melanoma of skin: Secondary | ICD-10-CM | POA: Diagnosis not present

## 2017-04-16 DIAGNOSIS — D485 Neoplasm of uncertain behavior of skin: Secondary | ICD-10-CM | POA: Diagnosis not present

## 2017-04-16 DIAGNOSIS — L821 Other seborrheic keratosis: Secondary | ICD-10-CM | POA: Diagnosis not present

## 2017-04-16 DIAGNOSIS — D2272 Melanocytic nevi of left lower limb, including hip: Secondary | ICD-10-CM | POA: Diagnosis not present

## 2017-04-16 DIAGNOSIS — Z85828 Personal history of other malignant neoplasm of skin: Secondary | ICD-10-CM | POA: Diagnosis not present

## 2017-04-16 DIAGNOSIS — L820 Inflamed seborrheic keratosis: Secondary | ICD-10-CM | POA: Diagnosis not present

## 2017-04-16 NOTE — Telephone Encounter (Signed)
°  Follow Up ° °Calling to follow up on test results. Please call. °

## 2017-04-16 NOTE — Telephone Encounter (Signed)
Follow up     Patient returning call for results to [facility]

## 2017-04-16 NOTE — Telephone Encounter (Signed)
Returned pts call and she has been made aware of her vas [place] results.  See result note.

## 2017-04-16 NOTE — Telephone Encounter (Signed)
-----   Message from Charlie Pitter, [place] sent at 04/16/2017  8:18 AM EST ----- Pt recently seen by Dr. Irish Lack, Please let patient know duplex showed <50% stenosis internally bilaterally, and >50% stenosis externally - per report, this should be followed up in 12 months (typically we follow the internal stenosis until it reaches a certain point beyond 79% to decide on requiring intervention). Scheduling can be at discretion of primary cardiologist when following up next. Will cc to Dr. Irish Lack. Dayna Dunn PA-C

## 2017-04-16 NOTE — Telephone Encounter (Signed)
-----   Message from [name], [place] sent at 04/16/2017  8:18 AM EST ----- Pt recently seen by Dr. Irish Lack, Please let patient know duplex showed <50% stenosis internally bilaterally, and >50% stenosis externally - per report, this should be followed up in 12 months (typically we follow the internal stenosis until it reaches a certain point beyond 79% to decide on requiring intervention). Scheduling can be at discretion of primary cardiologist when following up next. Will cc to Dr. Irish Lack. Dayna Dunn PA-C

## 2017-04-16 NOTE — Telephone Encounter (Signed)
See phone note 04/16/17.

## 2017-04-21 ENCOUNTER — Encounter: Payer: Self-pay | Admitting: Family Medicine

## 2017-04-27 ENCOUNTER — Telehealth: Payer: Self-pay

## 2017-04-27 DIAGNOSIS — I739 Peripheral vascular disease, unspecified: Principal | ICD-10-CM

## 2017-04-27 DIAGNOSIS — I779 Disorder of arteries and arterioles, unspecified: Secondary | ICD-10-CM

## 2017-04-27 NOTE — Telephone Encounter (Signed)
Reviewed results with patient again and made her aware that Dr. Irish Lack would like to repeat the carotid US in 1 year. Patient verbalized understanding and thanked me for the call. Repeat Carotid ordered for 1 year.     Jettie Booze, MD  Drue Novel I, RN        Repeat carotid in one year.   JV   Previous Messages    ----- Message -----  From: Charlie Pitter, PA-C  Sent: 04/16/2017  8:18 AM  To: Jettie Booze, MD, Jeanann Lewandowsky, RMA   Pt recently seen by Dr. Irish Lack, Please let patient know duplex showed <50% stenosis internally bilaterally, and >50% stenosis externally - per report, this should be followed up in 12 months (typically we follow the internal stenosis until it reaches a certain point beyond 79% to decide on requiring intervention). Scheduling can be at discretion of primary cardiologist when following up next. Will cc to Dr. Irish Lack. Dayna Dunn PA-C

## 2017-05-14 DIAGNOSIS — L988 Other specified disorders of the skin and subcutaneous tissue: Secondary | ICD-10-CM | POA: Diagnosis not present

## 2017-05-14 DIAGNOSIS — D485 Neoplasm of uncertain behavior of skin: Secondary | ICD-10-CM | POA: Diagnosis not present

## 2017-05-27 ENCOUNTER — Ambulatory Visit
Admission: RE | Admit: 2017-05-27 | Discharge: 2017-05-27 | Disposition: A | Discharge status: Home / Self Care | Payer: Medicare Other | Source: Ambulatory Visit | Attending: General Surgery | Admitting: General Surgery

## 2017-05-27 DIAGNOSIS — Z1231 Encounter for screening mammogram for malignant neoplasm of breast: Secondary | ICD-10-CM | POA: Diagnosis not present

## 2017-06-02 ENCOUNTER — Encounter: Payer: Self-pay | Admitting: General Surgery

## 2017-06-02 ENCOUNTER — Ambulatory Visit: Payer: Medicare Other | Admitting: General Surgery

## 2017-06-02 VITALS — BP 128/70 | HR 86 | Resp 14 | Ht 60.0 in | Wt 152.0 lb

## 2017-06-02 DIAGNOSIS — Z1231 Encounter for screening mammogram for malignant neoplasm of breast: Secondary | ICD-10-CM | POA: Diagnosis not present

## 2017-06-02 NOTE — Patient Instructions (Signed)
Patient will be asked to return to the office in one year with a bilateral screening mammogram. The patient is aware to call back for any questions or concerns. 

## 2017-06-02 NOTE — Progress Notes (Signed)
Patient ID: [name], female   DOB: 06/24/1940, 77 y.o.   MRN: [id number]  Chief Complaint  Patient presents with  . Follow-up    HPI [name] is a 77 y.o. female who presents for a breast evaluation. The most recent mammogram was done on 05/27/2017.  Patient does perform regular self breast checks and gets regular mammograms done.    HPI  Past Medical History:  Diagnosis Date  . Arthritis    hands, knees, ankles  . BCC (basal cell carcinoma of skin) 2016   Stinehelfer  . Bilateral carotid artery stenosis    a. s/p right carotid stent after restenosis post CEA, left carotid stent (previously on Plavix for these). b. Carotid US 03/2016: stable 40-59% BICA.  Marland Kitchen Fibrocystic breast disease    followed by surgery Jamal Collin)  . GERD (gastroesophageal reflux disease)    OTC prilosec  . Hernia 2005  . History of chicken pox   . History of colon polyps   . History of melanoma 1990s   right shoulder  . HLD (hyperlipidemia)   . HTN (hypertension) 1970s  . Hypertensive retinopathy of both eyes, grade 1 2017   Bulakowski  . Kidney cyst, acquired 2016   large R septated 8cm, stable 2017  . Melanoma in situ ([place]) 2017   R medial posterior arm - lentio maligna type, margin involved ([place])  . PAF (paroxysmal atrial fibrillation) ([place])    a. dx 12/2015 at routine physical.    Past Surgical History:  Procedure Laterality Date  . ABI  2007   R 1.19, L 1.2  . APPENDECTOMY  1995  . BREAST BIOPSY Left 1997   left  . BREAST CYST ASPIRATION Left 02/1997   fna  . BREAST EXCISIONAL BIOPSY Right 1998   benign per pt  . CARDIOVASCULAR STRESS TEST  ~2003   WNL  . CAROTID ENDARTERECTOMY  2002   bilateral  . CAROTID STENT Right 2002   right after restenosis post CEA (Dr. Edd Arbour)  . CAROTID STENT  2014   for stenosis  . CAROTID STENT Left 2014   Dr. Ardeth Sportsman, [place]  . CESAREAN SECTION  1968; 1972  . COLONOSCOPY  03/2012   Sankar - polyp (rec rpt 5 yrs)  . EXPLORATORY  LAPAROTOMY  1990s   for GI pain, inflamed lower GI, unrevealing workup, improved with abx  . HERNIA REPAIR  2005   abdomen  . MELANOMA EXCISION  1998   right shoulder  . TOTAL HIP ARTHROPLASTY  2005   bilat    Family History  Problem Relation Age of Onset  . Cancer Mother 54       lung, smoker  . Cancer Father 77       prostate  . Hypertension Paternal Grandfather   . CAD Neg Hx   . Stroke Neg Hx   . Diabetes Neg Hx   . Heart attack Neg Hx   . Breast cancer Neg Hx     Social History Social History   Tobacco Use  . Smoking status: Former Smoker    Packs/day: 1.00    Years: 30.00    Pack years: 30.00  . Smokeless tobacco: Never Used  Substance Use Topics  . Alcohol use: Yes    Alcohol/week: 0.0 oz    Comment: 1 glass wine daily  . Drug use: No    No Known Allergies  Current Outpatient Medications  Medication Sig Dispense Refill  . apixaban (ELIQUIS) 5 MG TABS  tablet Take 1 tablet (5 mg total) by mouth 2 (two) times daily. 180 tablet 1  . atorvastatin (LIPITOR) 40 MG tablet TAKE 1 TABLET BY MOUTH AT  BEDTIME 90 tablet 0  . calcium-vitamin D (OSCAL WITH D) 500-200 MG-UNIT per tablet Take 1 tablet by mouth daily.    Marland Kitchen diltiazem (CARDIZEM CD) 120 MG 24 hr capsule TAKE 1 CAPSULE BY MOUTH  DAILY 90 capsule 2  . enalapril (VASOTEC) 5 MG tablet Take 1 tablet (5 mg total) by mouth daily. MUST SCHEDULE OFFICE VISIT FOR MORE REFILLS 90 tablet 0  . fluticasone (FLONASE) 50 MCG/ACT nasal spray Place 2 sprays into both nostrils daily as needed for allergies or rhinitis.    Marland Kitchen FLUZONE HIGH-DOSE 0.5 ML SUSY TO BE ADMINISTERED BY PHARMACIST FOR IMMUNIZATION  0  . metoprolol succinate (TOPROL-XL) 100 MG 24 hr tablet Take 1 tablet (100 mg total) by mouth daily. 90 tablet 0  . Multiple Vitamin (MULTIVITAMIN) capsule Take 1 capsule by mouth daily.    Marland Kitchen triamcinolone cream (KENALOG) 0.1 % Apply 1 application topically daily as needed (skin cancer).     No current facility-administered  medications for this visit.     Review of Systems Review of Systems  Constitutional: Negative.   Respiratory: Negative.   Cardiovascular: Negative.     Blood pressure 128/70, pulse 86, resp. rate 14, height 5' (1.524 m), weight 152 lb (68.9 kg).  Physical Exam Physical Exam  Constitutional: She is oriented to person, place, and time. She appears well-developed and well-nourished.  Eyes: Conjunctivae are normal. No scleral icterus.  Neck: Neck supple.  Cardiovascular: Normal rate, regular rhythm and normal heart sounds.  Pulmonary/Chest: Effort normal and breath sounds normal. Right breast exhibits no inverted nipple, no mass, no nipple discharge, no skin change and no tenderness. Left breast exhibits no inverted nipple, no mass, no nipple discharge, no skin change and no tenderness.  Lymphadenopathy:    She has no cervical adenopathy.    She has no axillary adenopathy.  Neurological: She is alert and oriented to person, place, and time.  Skin: Skin is warm and dry.    Data Reviewed Bilateral screening mammograms dated May 27, 2017 reviewed.  No interval change.  BI-RADS-1.  Assessment    Benign breast exam.    Plan          Patient will be asked to return to the office in one year with a bilateral screening mammogram. The patient is aware to call back for any questions or concerns.   HPI, Physical Exam, Assessment and Plan have been scribed under the direction and in the presence of Hervey Ard, MD.  Gaspar Cola, CMA  I have completed the exam and reviewed the above documentation for accuracy and completeness.  I agree with the above.  Haematologist has been used and any errors in dictation or transcription are unintentional.  Hervey Ard, M.D., F.A.C.S.  Forest Gleason Juley Giovanetti 06/04/2017, 6:54 AM

## 2017-06-03 ENCOUNTER — Ambulatory Visit (HOSPITAL_COMMUNITY)
Admission: RE | Admit: 2017-06-03 | Payer: Medicare Other | Source: Ambulatory Visit | Attending: Interventional Cardiology | Admitting: Interventional Cardiology

## 2017-06-27 ENCOUNTER — Other Ambulatory Visit: Payer: Self-pay | Admitting: Interventional Cardiology

## 2017-06-27 ENCOUNTER — Other Ambulatory Visit: Payer: Self-pay | Admitting: Family Medicine

## 2017-07-16 DIAGNOSIS — Z85820 Personal history of malignant melanoma of skin: Secondary | ICD-10-CM | POA: Diagnosis not present

## 2017-07-16 DIAGNOSIS — D485 Neoplasm of uncertain behavior of skin: Secondary | ICD-10-CM | POA: Diagnosis not present

## 2017-07-16 DIAGNOSIS — Z85828 Personal history of other malignant neoplasm of skin: Secondary | ICD-10-CM | POA: Diagnosis not present

## 2017-07-16 DIAGNOSIS — L570 Actinic keratosis: Secondary | ICD-10-CM | POA: Diagnosis not present

## 2017-07-16 DIAGNOSIS — L821 Other seborrheic keratosis: Secondary | ICD-10-CM | POA: Diagnosis not present

## 2017-08-05 DIAGNOSIS — H3562 Retinal hemorrhage, left eye: Secondary | ICD-10-CM | POA: Diagnosis not present

## 2017-08-05 DIAGNOSIS — H524 Presbyopia: Secondary | ICD-10-CM | POA: Diagnosis not present

## 2017-08-05 DIAGNOSIS — H2513 Age-related nuclear cataract, bilateral: Secondary | ICD-10-CM | POA: Diagnosis not present

## 2017-08-05 DIAGNOSIS — H1045 Other chronic allergic conjunctivitis: Secondary | ICD-10-CM | POA: Diagnosis not present

## 2017-10-13 ENCOUNTER — Other Ambulatory Visit: Payer: Self-pay | Admitting: Interventional Cardiology

## 2017-10-13 NOTE — Telephone Encounter (Signed)
Pt is a 77 yr female who last saw Dr. Irish Lack on 04/14/17. Last noted weight is 68.9Kg on 06/02/17. Serum creatine was 1.06 on 04/15/17. Will refill Eliquis 5mg  BID.

## 2017-11-26 ENCOUNTER — Encounter: Payer: Medicare Other | Admitting: Internal Medicine

## 2018-02-11 ENCOUNTER — Ambulatory Visit: Payer: Medicare Other | Admitting: Internal Medicine

## 2018-02-11 ENCOUNTER — Encounter: Payer: Self-pay | Admitting: Internal Medicine

## 2018-02-11 VITALS — BP 112/76 | HR 81 | Temp 98.2°F | Ht 60.0 in | Wt 153.8 lb

## 2018-02-11 DIAGNOSIS — R7303 Prediabetes: Secondary | ICD-10-CM

## 2018-02-11 DIAGNOSIS — D751 Secondary polycythemia: Secondary | ICD-10-CM

## 2018-02-11 DIAGNOSIS — Z1389 Encounter for screening for other disorder: Secondary | ICD-10-CM

## 2018-02-11 DIAGNOSIS — Z1231 Encounter for screening mammogram for malignant neoplasm of breast: Secondary | ICD-10-CM | POA: Diagnosis not present

## 2018-02-11 DIAGNOSIS — I158 Other secondary hypertension: Secondary | ICD-10-CM | POA: Diagnosis not present

## 2018-02-11 DIAGNOSIS — E2839 Other primary ovarian failure: Secondary | ICD-10-CM

## 2018-02-11 DIAGNOSIS — Z85828 Personal history of other malignant neoplasm of skin: Secondary | ICD-10-CM

## 2018-02-11 DIAGNOSIS — M25511 Pain in right shoulder: Secondary | ICD-10-CM

## 2018-02-11 DIAGNOSIS — Z1329 Encounter for screening for other suspected endocrine disorder: Secondary | ICD-10-CM

## 2018-02-11 DIAGNOSIS — E559 Vitamin D deficiency, unspecified: Secondary | ICD-10-CM

## 2018-02-11 DIAGNOSIS — I4891 Unspecified atrial fibrillation: Secondary | ICD-10-CM

## 2018-02-11 DIAGNOSIS — Z1159 Encounter for screening for other viral diseases: Secondary | ICD-10-CM

## 2018-02-11 DIAGNOSIS — M2550 Pain in unspecified joint: Secondary | ICD-10-CM

## 2018-02-11 DIAGNOSIS — G8929 Other chronic pain: Secondary | ICD-10-CM

## 2018-02-11 DIAGNOSIS — Z0184 Encounter for antibody response examination: Secondary | ICD-10-CM

## 2018-02-11 DIAGNOSIS — Z13220 Encounter for screening for lipoid disorders: Secondary | ICD-10-CM

## 2018-02-11 DIAGNOSIS — I10 Essential (primary) hypertension: Secondary | ICD-10-CM

## 2018-02-11 NOTE — Progress Notes (Signed)
Pre visit review using our clinic review tool, if applicable. No additional management support is needed unless otherwise documented below in the visit note. 

## 2018-02-11 NOTE — Patient Instructions (Addendum)
Tdap vaccine   Shingrix x 2 doses   Take more than 3000 mg Tylenol per day   DEXA/bone density    Shoulder Pain Many things can cause shoulder pain, including:  An injury to the area.  Overuse of the shoulder.  Arthritis.  The source of the pain can be:  Inflammation.  An injury to the shoulder joint.  An injury to a tendon, ligament, or bone.  Follow these instructions at home: Take these actions to help with your pain:  Squeeze a soft ball or a foam pad as much as possible. This helps to keep the shoulder from swelling. It also helps to strengthen the arm.  Take over-the-counter and prescription medicines only as told by your health care provider.  If directed, apply ice to the area: ? Put ice in a plastic bag. ? Place a towel between your skin and the bag. ? Leave the ice on for 20 minutes, 2-3 times per day. Stop applying ice if it does not help with the pain.  If you were given a shoulder sling or immobilizer: ? Wear it as told. ? Remove it to shower or bathe. ? Move your arm as little as possible, but keep your hand moving to prevent swelling.  Contact a health care provider if:  Your pain gets worse.  Your pain is not relieved with medicines.  New pain develops in your arm, hand, or fingers. Get help right away if:  Your arm, hand, or fingers: ? Tingle. ? Become numb. ? Become swollen. ? Become painful. ? Turn white or blue. This information is not intended to replace advice given to you by your health care provider. Make sure you discuss any questions you have with your health care provider. Document Released: 02/05/2005 Document Revised: 12/23/2015 Document Reviewed: 08/21/2014 Elsevier Interactive Patient Education  2018 [place] is a term that is commonly used to refer to joint pain or joint disease. There are more than 100 types of arthritis. What are the causes? The most common cause of this condition is wear and  tear of a joint. Other causes include:  Gout.  Inflammation of a joint.  An infection of a joint.  Sprains and other injuries near the joint.  A drug reaction or allergic reaction.  In some cases, the cause may not be known. What are the signs or symptoms? The main symptom of this condition is pain in the joint with movement. Other symptoms include:  Redness, swelling, or stiffness at a joint.  Warmth coming from the joint.  Fever.  Overall feeling of illness.  How is this diagnosed? This condition may be diagnosed with a physical exam and tests, including:  Blood tests.  Urine tests.  Imaging tests, such as MRI, X-rays, or a CT scan.  Sometimes, fluid is removed from a joint for testing. How is this treated? Treatment for this condition may involve:  Treatment of the cause, if it is known.  Rest.  Raising (elevating) the joint.  Applying cold or hot packs to the joint.  Medicines to improve symptoms and reduce inflammation.  Injections of a steroid such as cortisone into the joint to help reduce pain and inflammation.  Depending on the cause of your arthritis, you may need to make lifestyle changes to reduce stress on your joint. These changes may include exercising more and losing weight. Follow these instructions at home: Medicines  Take over-the-counter and prescription medicines only as told by your health care  provider.  Do not take aspirin to relieve pain if gout is suspected. Activity  Rest your joint if told by your health care provider. Rest is important when your disease is active and your joint feels painful, swollen, or stiff.  Avoid activities that make the pain worse. It is important to balance activity with rest.  Exercise your joint regularly with range-of-motion exercises as told by your health care provider. Try doing low-impact exercise, such as: ? Swimming. ? Water aerobics. ? Biking. ? Walking. Joint Care   If your joint is  swollen, keep it elevated if told by your health care provider.  If your joint feels stiff in the morning, try taking a warm shower.  If directed, apply heat to the joint. If you have diabetes, do not apply heat without permission from your health care provider. ? Put a towel between the joint and the hot pack or heating pad. ? Leave the heat on the area for 20-30 minutes.  If directed, apply ice to the joint: ? Put ice in a plastic bag. ? Place a towel between your skin and the bag. ? Leave the ice on for 20 minutes, 2-3 times per day.  Keep all follow-up visits as told by your health care provider. This is important. Contact a health care provider if:  The pain gets worse.  You have a fever. Get help right away if:  You develop severe joint pain, swelling, or redness.  Many joints become painful and swollen.  You develop severe back pain.  You develop severe weakness in your leg.  You cannot control your bladder or bowels. This information is not intended to replace advice given to you by your health care provider. Make sure you discuss any questions you have with your health care provider. Document Released: 06/05/2004 Document Revised: 10/04/2015 Document Reviewed: 07/24/2014 Elsevier Interactive Patient Education  2018 [place] Zoster (Shingles) Vaccine, RZV: What You Need to Know 1. Why get vaccinated? Shingles (also called herpes zoster, or just zoster) is a painful skin rash, often with blisters. Shingles is caused by the varicella zoster virus, the same virus that causes chickenpox. After you have chickenpox, the virus stays in your body and can cause shingles later in life. You can't catch shingles from another person. However, a person who has never had chickenpox (or chickenpox vaccine) could get chickenpox from someone with shingles. A shingles rash usually appears on one side of the face or body and heals within 2 to 4 weeks. Its main symptom  is pain, which can be severe. Other symptoms can include fever, headache, chills and upset stomach. Very rarely, a shingles infection can lead to pneumonia, hearing problems, blindness, brain inflammation (encephalitis), or death. For about 1 person in 5, severe pain can continue even long after the rash has cleared up. This long-lasting pain is called post-herpetic neuralgia (PHN). Shingles is far more common in people 19 years of age and older than in younger people, and the risk increases with age. It is also more common in people whose immune system is weakened because of a disease such as cancer, or by drugs such as steroids or chemotherapy. At least 1 million people a year in the [place] get shingles. 2. Shingles vaccine (recombinant) Recombinant shingles vaccine was approved by FDA in 2017 for the prevention of shingles. In clinical trials, it was more than 90% effective in preventing shingles. It can also reduce the likelihood of PHN. Two doses, 2  to 6 months apart, are recommended for adults 53 and older. This vaccine is also recommended for people who have already gotten the live shingles vaccine (Zostavax). There is no live virus in this vaccine. 3. Some people should not get this vaccine Tell your vaccine provider if you:  Have any severe, life-threatening allergies. A person who has ever had a life-threatening allergic reaction after a dose of recombinant shingles vaccine, or has a severe allergy to any component of this vaccine, may be advised not to be vaccinated. Ask your health care provider if you want information about vaccine components.  Are pregnant or breastfeeding. There is not much information about use of recombinant shingles vaccine in pregnant or nursing women. Your healthcare provider might recommend delaying vaccination.  Are not feeling well. If you have a mild illness, such as a cold, you can probably get the vaccine today. If you are moderately or severely ill,  you should probably wait until you recover. Your doctor can advise you.  4. Risks of a vaccine reaction With any medicine, including vaccines, there is a chance of reactions. After recombinant shingles vaccination, a person might experience:  Pain, redness, soreness, or swelling at the site of the injection  Headache, muscle aches, fever, shivering, fatigue  In clinical trials, most people got a sore arm with mild or moderate pain after vaccination, and some also had redness and swelling where they got the shot. Some people felt tired, had muscle pain, a headache, shivering, fever, stomach pain, or nausea. About 1 out of 6 people who got recombinant zoster vaccine experienced side effects that prevented them from doing regular activities. Symptoms went away on their own in about 2 to 3 days. Side effects were more common in younger people. You should still get the second dose of recombinant zoster vaccine even if you had one of these reactions after the first dose. Other things that could happen after this vaccine:  People sometimes faint after medical procedures, including vaccination. Sitting or lying down for about 15 minutes can help prevent fainting and injuries caused by a fall. Tell your provider if you feel dizzy or have vision changes or ringing in the ears.  Some people get shoulder pain that can be more severe and longer-lasting than routine soreness that can follow injections. This happens very rarely.  Any medication can cause a severe allergic reaction. Such reactions to a vaccine are estimated at about 1 in a million doses, and would happen within a few minutes to a few hours after the vaccination. As with any medicine, there is a very remote chance of a vaccine causing a serious injury or death. The safety of vaccines is always being monitored. For more information, visit: http://www.aguilar.org/ 5. What if there is a serious problem? What should I look for?  Look for  anything that concerns you, such as signs of a severe allergic reaction, very high fever, or unusual behavior. Signs of a severe allergic reaction can include hives, swelling of the face and throat, difficulty breathing, a fast heartbeat, dizziness, and weakness. These would usually start a few minutes to a few hours after the vaccination. What should I do?  If you think it is a severe allergic reaction or other emergency that can't wait, call 9-1-1 and get to the nearest hospital. Otherwise, call your health care provider. Afterward, the reaction should be reported to the Vaccine Adverse Event Reporting System (VAERS). Your doctor should file this report, or you can do it  yourself through the VAERS web site atwww.vaers.https://www.bray.com/ by calling 581-425-7846. VAERS does not give medical advice. 6. How can I learn more?  Ask your healthcare provider. He or she can give you the vaccine package insert or suggest other sources of information.  Call your local or state health department.  Contact the Centers for Disease Control and Prevention (CDC): ? Call 442-038-7129 (1-800-CDC-INFO) or ? Visit the CDC's website at http://hunter.com/ CDC Vaccine Information Statement (VIS) Recombinant Zoster Vaccine (06/23/2016) This information is not intended to replace advice given to you by your health care provider. Make sure you discuss any questions you have with your health care provider. Document Released: 07/08/2016 Document Revised: 07/08/2016 Document Reviewed: 07/08/2016 Elsevier Interactive Patient Education  2018 [employer].  DTaP Vaccine (Diphtheria, Tetanus, and Pertussis): What You Need to Know 1. Why get vaccinated? Diphtheria, tetanus, and pertussis are serious diseases caused by bacteria. Diphtheria and pertussis are spread from person to person. Tetanus enters the body through cuts or wounds. DIPHTHERIA causes a thick covering in the back of the throat.  It can lead to breathing  problems, paralysis, heart failure, and even death.  TETANUS (Lockjaw) causes painful tightening of the muscles, usually all over the body.  It can lead to "locking" of the jaw so the victim cannot open his mouth or swallow. Tetanus leads to death in up to 2 out of 10 cases.  PERTUSSIS (Whooping Cough) causes coughing spells so bad that it is hard for infants to eat, drink, or breathe. These spells can last for weeks.  It can lead to pneumonia, seizures (jerking and staring spells), brain damage, and death.  Diphtheria, tetanus, and pertussis vaccine (DTaP) can help prevent these diseases. Most children who are vaccinated with DTaP will be protected throughout childhood. Many more children would get these diseases if we stopped vaccinating. DTaP is a safer version of an older vaccine called DTP. DTP is no longer used in the [place]. 2. Who should get DTaP vaccine and when? Children should get 5 doses of DTaP vaccine, one dose at each of the following ages:  2 months  4 months  6 months  15-18 months  4-6 years  DTaP may be given at the same time as other vaccines. 3. Some children should not get DTaP vaccine or should wait  Children with minor illnesses, such as a cold, may be vaccinated. But children who are moderately or severely ill should usually wait until they recover before getting DTaP vaccine.  Any child who had a life-threatening allergic reaction after a dose of DTaP should not get another dose.  Any child who suffered a brain or nervous system disease within 7 days after a dose of DTaP should not get another dose.  Talk with your doctor if your child: ? had a seizure or collapsed after a dose of DTaP, ? cried non-stop for 3 hours or more after a dose of DTaP, ? had a fever over 105F after a dose of DTaP. Ask your doctor for more information. Some of these children should not get another dose of pertussis vaccine, but may get a vaccine without pertussis, called  DT. 4. Older children and adults DTaP is not licensed for adolescents, adults, or children 40 years of age and older. But older people still need protection. A vaccine called Tdap is similar to DTaP. A single dose of Tdap is recommended for people 11 through 79 years of age. Another vaccine, called Td, protects against tetanus and diphtheria,  but not pertussis. It is recommended every 10 years. There are separate Vaccine Information Statements for these vaccines. 5. What are the risks from DTaP vaccine? Getting diphtheria, tetanus, or pertussis disease is much riskier than getting DTaP vaccine. However, a vaccine, like any medicine, is capable of causing serious problems, such as severe allergic reactions. The risk of DTaP vaccine causing serious harm, or death, is extremely small. Mild problems (common)  Fever (up to about 1 child in 4)  Redness or swelling where the shot was given (up to about 1 child in 4)  Soreness or tenderness where the shot was given (up to about 1 child in 4) These problems occur more often after the 4th and 5th doses of the DTaP series than after earlier doses. Sometimes the 4th or 5th dose of DTaP vaccine is followed by swelling of the entire arm or leg in which the shot was given, lasting 1-7 days (up to about 1 child in 25). Other mild problems include:  Fussiness (up to about 1 child in 3)  Tiredness or poor appetite (up to about 1 child in 10)  Vomiting (up to about 1 child in 69) These problems generally occur 1-3 days after the shot. Moderate problems (uncommon)  Seizure (jerking or staring) (about 1 child out of 14,000)  Non-stop crying, for 3 hours or more (up to about 1 child out of 1,000)  High fever, over 105F (about 1 child out of 16,000) Severe problems (very rare)  Serious allergic reaction (less than 1 out of a million doses)  Several other severe problems have been reported after DTaP vaccine. These include: ? Long-term seizures, coma, or  lowered consciousness ? Permanent brain damage. These are so rare it is hard to tell if they are caused by the vaccine. Controlling fever is especially important for children who have had seizures, for any reason. It is also important if another family member has had seizures. You can reduce fever and pain by giving your child an aspirin-free pain reliever when the shot is given, and for the next 24 hours, following the package instructions. 6. What if there is a serious reaction? What should I look for? Look for anything that concerns you, such as signs of a severe allergic reaction, very high fever, or behavior changes. Signs of a severe allergic reaction can include hives, swelling of the face and throat, difficulty breathing, a fast heartbeat, dizziness, and weakness. These would start a few minutes to a few hours after the vaccination. What should I do?  If you think it is a severe allergic reaction or other emergency that can't wait, call 9-1-1 or get the person to the nearest hospital. Otherwise, call your doctor.  Afterward, the reaction should be reported to the Vaccine Adverse Event Reporting System (VAERS). Your doctor might file this report, or you can do it yourself through the VAERS web site at www.vaers.SamedayNews.es, or by calling 831-414-9742. ? VAERS is only for reporting reactions. They do not give medical advice. 7. The National Vaccine Injury Compensation Program The [employer] Vaccine Injury Compensation Program (VICP) is a federal program that was created to compensate people who may have been injured by certain vaccines. Persons who believe they may have been injured by a vaccine can learn about the program and about filing a claim by calling (442) 769-4396 or visiting the [employer] website at GoldCloset.com.ee. 8. How can I learn more?  Ask your doctor.  Call your local or state health department.  Contact the  Centers for Disease Control and Prevention  (CDC): ? Call (606) 631-8872 (1-800-CDC-INFO) or ? Visit CDC's website at http://hunter.com/ CDC DTaP Vaccine (Diphtheria, Tetanus, and Pertussis) VIS (09/25/05) This information is not intended to replace advice given to you by your health care provider. Make sure you discuss any questions you have with your health care provider. Document Released: 02/23/2006 Document Revised: 01/17/2016 Document Reviewed: 01/17/2016 Elsevier Interactive Patient Education  2017 [employer].

## 2018-02-12 ENCOUNTER — Encounter: Payer: Self-pay | Admitting: Internal Medicine

## 2018-02-12 DIAGNOSIS — Z85828 Personal history of other malignant neoplasm of skin: Secondary | ICD-10-CM | POA: Insufficient documentation

## 2018-02-12 NOTE — Progress Notes (Addendum)
Chief Complaint  Patient presents with  . Follow-up   TOC  1. Right shoulder pain chronic and intermittent she injured it several years ago as working for [employer] pain with ROM and limited ROM and feels tight nothing tried other than tylenol  2. H/o MM right shoulder in situ, SCC, [facility] [place] dermatology appt next week  3. Wants shingrix Rx  4. Afib rate controlled on eliquis, Toprol XL 100 mg qd f/u cards    Review of Systems  Constitutional: Negative for weight loss.  HENT: Negative for hearing loss.   Eyes: Negative for blurred vision.  Respiratory: Negative for shortness of breath.   Cardiovascular: Negative for chest pain.  Gastrointestinal: Negative for abdominal pain.  Musculoskeletal: Positive for joint pain.  Skin: Negative for rash.  Neurological: Negative for headaches.  Psychiatric/Behavioral: Negative for depression and memory loss.   Past Medical History:  Diagnosis Date  . Arthritis    hands, knees, ankles  . BCC (basal cell carcinoma of skin) 2016   [place]fer  . Bilateral carotid artery stenosis    a. s/p right carotid stent after restenosis post CEA, left carotid stent (previously on Plavix for these). b. Carotid US 03/2016: stable 40-59% BICA.  Marland Kitchen Fibrocystic breast disease    followed by surgery Jamal Collin)  . GERD (gastroesophageal reflux disease)    OTC prilosec  . Hernia 2005  . History of chicken pox   . History of colon polyps   . History of melanoma 1990s   right shoulder  . HLD (hyperlipidemia)   . HTN (hypertension) 1970s  . Hypertensive retinopathy of both eyes, grade 1 2017   Bulakowski  . Kidney cyst, acquired 2016   large R septated 8cm, stable 2017  . 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 PAF (paroxysmal atrial fibrillation) ([place])    a. dx 12/2015 at routine physical.   Past Surgical History:  Procedure Laterality Date  . ABI  2007   R 1.19, L 1.2  . APPENDECTOMY   1995  . BREAST BIOPSY Left 1997   left  . BREAST CYST ASPIRATION Left 02/1997   fna  . BREAST EXCISIONAL BIOPSY Right 1998   benign per pt  . CARDIOVASCULAR STRESS TEST  ~2003   WNL  . CAROTID ENDARTERECTOMY  2002   bilateral  . CAROTID STENT Right 2002   right after restenosis post CEA (Dr. Edd Arbour)  . CAROTID STENT  2014   for stenosis  . CAROTID STENT Left 2014   Dr. Ardeth Sportsman, [place]  . CESAREAN SECTION  1968; 1972  . COLONOSCOPY  03/2012   Sankar - polyp (rec rpt 5 yrs)  . EXPLORATORY LAPAROTOMY  1990s   for GI pain, inflamed lower GI, unrevealing workup, improved with abx  . HERNIA REPAIR  2005   abdomen  . MELANOMA EXCISION  1998   right shoulder  . TOTAL HIP ARTHROPLASTY  2005   bilat   Family History  Problem Relation Age of Onset  . Cancer Mother 55       lung, smoker  . Cancer Father 33       prostate  . Hypertension Paternal Grandfather   . CAD Neg Hx   . Stroke Neg Hx   . Diabetes Neg Hx   . Heart attack Neg Hx   . Breast cancer Neg Hx    Social History   Socioeconomic History  . Marital status:  Widowed    Spouse name: Not on file  . Number of children: Not on file  . Years of education: Not on file  . Highest education level: Not on file  Occupational History  . Not on file  Social Needs  . Financial resource strain: Not on file  . Food insecurity:    Worry: Not on file    Inability: Not on file  . Transportation needs:    Medical: Not on file    Non-medical: Not on file  Tobacco Use  . Smoking status: Former Smoker    Packs/day: 1.00    Years: 30.00    Pack years: 30.00  . Smokeless tobacco: Never Used  Substance and Sexual Activity  . Alcohol use: Yes    Alcohol/week: 0.0 standard drinks    Comment: 1 glass wine daily  . Drug use: No  . Sexual activity: Not on file  Lifestyle  . Physical activity:    Days per week: Not on file    Minutes per session: Not on file  . Stress: Not on file  Relationships  . Social connections:     Talks on phone: Not on file    Gets together: Not on file    Attends religious service: Not on file    Active member of club or organization: Not on file    Attends meetings of clubs or organizations: Not on file    Relationship status: Not on file  . Intimate partner violence:    Fear of current or ex partner: Not on file    Emotionally abused: Not on file    Physically abused: Not on file    Forced sexual activity: Not on file  Other Topics Concern  . Not on file  Social History Narrative   Caffeine: 3 cups coffee/day   Widow of husband Jori Moll), lives with 1 dog.  Grown children   Occupation: homemaker   Edu: college   Activity: plays golf, walks dog, swims   Diet: good water, fruits/vegetables daily   Current Meds  Medication Sig  . atorvastatin (LIPITOR) 40 MG tablet TAKE 1 TABLET BY MOUTH AT  BEDTIME  . calcium-vitamin D (OSCAL WITH D) 500-200 MG-UNIT per tablet Take 1 tablet by mouth daily.  Marland Kitchen diltiazem (CARDIZEM CD) 120 MG 24 hr capsule TAKE 1 CAPSULE BY MOUTH  DAILY  . ELIQUIS 5 MG TABS tablet TAKE 1 TABLET BY MOUTH TWO  TIMES DAILY  . enalapril (VASOTEC) 5 MG tablet TAKE 1 TABLET BY MOUTH  DAILY.  . fluticasone (FLONASE) 50 MCG/ACT nasal spray Place 2 sprays into both nostrils daily as needed for allergies or rhinitis.  Marland Kitchen FLUZONE HIGH-DOSE 0.5 ML SUSY TO BE ADMINISTERED BY PHARMACIST FOR IMMUNIZATION  . metoprolol succinate (TOPROL-XL) 100 MG 24 hr tablet TAKE 1 TABLET BY MOUTH  DAILY  . Multiple Vitamin (MULTIVITAMIN) capsule Take 1 capsule by mouth daily.  Marland Kitchen triamcinolone cream (KENALOG) 0.1 % Apply 1 application topically daily as needed (skin cancer).   No Known Allergies No results found for this or any previous visit (from the past 2160 hour(s)). Objective  Body mass index is 30.04 kg/m. Wt Readings from Last 3 Encounters:  02/11/18 153 lb 12.8 oz (69.8 kg)  06/02/17 152 lb (68.9 kg)  04/14/17 155 lb 9.6 oz (70.6 kg)   Temp Readings from Last 3 Encounters:    02/11/18 98.2 F (36.8 C) (Oral)  12/31/15 98 F (36.7 C) (Oral)  11/03/14 98 F (36.7 C) (Oral)  BP Readings from Last 3 Encounters:  02/11/18 112/76  06/02/17 128/70  04/14/17 108/78   Pulse Readings from Last 3 Encounters:  02/11/18 81  06/02/17 86  04/14/17 88    Physical Exam  Constitutional: She is oriented to person, place, and time. Vital signs are normal. She appears well-developed and well-nourished. She is cooperative.  HENT:  Head: Normocephalic and atraumatic.  Mouth/Throat: Oropharynx is clear and moist and mucous membranes are normal.  Eyes: Pupils are equal, round, and reactive to light. Conjunctivae are normal.  Cardiovascular: Normal rate and normal heart sounds. An irregularly irregular rhythm present.  In Afib  Pulmonary/Chest: Effort normal and breath sounds normal.  Musculoskeletal:       Right shoulder: She exhibits decreased range of motion and tenderness.       Arms: Neurological: She is alert and oriented to person, place, and time. Gait normal.  Skin: Skin is warm, dry and intact.  Psychiatric: She has a normal mood and affect. Her speech is normal and behavior is normal. Judgment and thought content normal. Cognition and memory are normal.  Nursing note and vitals reviewed.   Assessment   1. Chronic right shoulder pain  2. H/o skin cancer  3. afib rate controlled, HTN controlled  4. HM Plan   1. Consider Xray in future consider ortho as well  Prn Tylenol for now  2. [place] derm appt next week  3 f/u cards cont meds  4.  Had flu shot 02/02/18  rec Tdap Given rx shingrix vaccine  pna 23 utd and prevnar  Check fasting labs upcoming  mammo 05/27/17 neg referred another  sch dEXa Colonoscopy 03/03/12 Sankar polyps x 2 hyperplastic f/u with Dr. Bary Castilla likely no other needed  Pap records [facility]  09/06/12 neg pap neg HPV Dr. Laurey Morale [facility]. atrophy+ Former smoker assess at f/u # years and Gerty.   Provider: Dr. Olivia Mackie  [facility]

## 2018-02-16 ENCOUNTER — Other Ambulatory Visit (INDEPENDENT_AMBULATORY_CARE_PROVIDER_SITE_OTHER): Payer: Medicare Other

## 2018-02-16 DIAGNOSIS — Z1159 Encounter for screening for other viral diseases: Secondary | ICD-10-CM

## 2018-02-16 DIAGNOSIS — E559 Vitamin D deficiency, unspecified: Secondary | ICD-10-CM

## 2018-02-16 DIAGNOSIS — Z13220 Encounter for screening for lipoid disorders: Secondary | ICD-10-CM

## 2018-02-16 DIAGNOSIS — I10 Essential (primary) hypertension: Secondary | ICD-10-CM | POA: Diagnosis not present

## 2018-02-16 DIAGNOSIS — M2550 Pain in unspecified joint: Secondary | ICD-10-CM

## 2018-02-16 DIAGNOSIS — Z0184 Encounter for antibody response examination: Secondary | ICD-10-CM

## 2018-02-16 DIAGNOSIS — R7303 Prediabetes: Secondary | ICD-10-CM | POA: Diagnosis not present

## 2018-02-16 DIAGNOSIS — Z1329 Encounter for screening for other suspected endocrine disorder: Secondary | ICD-10-CM

## 2018-02-16 NOTE — Addendum Note (Signed)
Addended by: Arby Barrette on: 02/16/2018 09:23 AM   Modules accepted: Orders

## 2018-02-17 LAB — COMPREHENSIVE METABOLIC PANEL
AG Ratio: 1.2 (calc) (ref 1.0–2.5)
ALT: 16 U/L (ref 6–29)
AST: 17 U/L (ref 10–35)
Albumin: 4.5 g/dL (ref 3.6–5.1)
Alkaline phosphatase (APISO): 102 U/L (ref 33–130)
BUN / CREAT RATIO: 16 (calc) (ref 6–22)
BUN: 18 mg/dL (ref 7–25)
CO2: 24 mmol/L (ref 20–32)
CREATININE: 1.12 mg/dL — AB (ref 0.60–0.93)
Calcium: 10 mg/dL (ref 8.6–10.4)
Chloride: 102 mmol/L (ref 98–110)
GLUCOSE: 95 mg/dL (ref 65–99)
Globulin: 3.7 g/dL (calc) (ref 1.9–3.7)
Potassium: 5.1 mmol/L (ref 3.5–5.3)
Sodium: 139 mmol/L (ref 135–146)
Total Bilirubin: 1 mg/dL (ref 0.2–1.2)
Total Protein: 8.2 g/dL — ABNORMAL HIGH (ref 6.1–8.1)

## 2018-02-18 DIAGNOSIS — Z85820 Personal history of malignant melanoma of skin: Secondary | ICD-10-CM | POA: Diagnosis not present

## 2018-02-18 DIAGNOSIS — Z85828 Personal history of other malignant neoplasm of skin: Secondary | ICD-10-CM | POA: Diagnosis not present

## 2018-02-18 DIAGNOSIS — L814 Other melanin hyperpigmentation: Secondary | ICD-10-CM | POA: Diagnosis not present

## 2018-02-18 DIAGNOSIS — L570 Actinic keratosis: Secondary | ICD-10-CM | POA: Diagnosis not present

## 2018-02-18 DIAGNOSIS — L821 Other seborrheic keratosis: Secondary | ICD-10-CM | POA: Diagnosis not present

## 2018-02-19 LAB — HEMOGLOBIN A1C
Hgb A1c MFr Bld: 5.7 % of total Hgb — ABNORMAL HIGH (ref <5.7)
Mean Plasma Glucose: 117 (calc)
eAG (mmol/L): 6.5 (calc)

## 2018-02-19 LAB — TSH: TSH: 1.88 mIU/L (ref 0.40–4.50)

## 2018-02-19 LAB — CBC WITH DIFFERENTIAL/PLATELET
BASOS ABS: 58 {cells}/uL (ref 0–200)
BASOS PCT: 1 %
EOS ABS: 99 {cells}/uL (ref 15–500)
Eosinophils Relative: 1.7 %
HCT: 47.5 % — ABNORMAL HIGH (ref 35.0–45.0)
Hemoglobin: 16.1 g/dL — ABNORMAL HIGH (ref 11.7–15.5)
Lymphs Abs: 2007 cells/uL (ref 850–3900)
MCH: 32.7 pg (ref 27.0–33.0)
MCHC: 33.9 g/dL (ref 32.0–36.0)
MCV: 96.3 fL (ref 80.0–100.0)
MONOS PCT: 9.9 %
MPV: 11.2 fL (ref 7.5–12.5)
Neutro Abs: 3062 cells/uL (ref 1500–7800)
Neutrophils Relative %: 52.8 %
PLATELETS: 207 10*3/uL (ref 140–400)
RBC: 4.93 10*6/uL (ref 3.80–5.10)
RDW: 13.1 % (ref 11.0–15.0)
TOTAL LYMPHOCYTE: 34.6 %
WBC mixed population: 574 cells/uL (ref 200–950)
WBC: 5.8 10*3/uL (ref 3.8–10.8)

## 2018-02-19 LAB — COMPLETE METABOLIC PANEL WITH GFR
AG Ratio: 1.2 (calc) (ref 1.0–2.5)
ALBUMIN MSPROF: 4.5 g/dL (ref 3.6–5.1)
ALKALINE PHOSPHATASE (APISO): 102 U/L (ref 33–130)
ALT: 16 U/L (ref 6–29)
AST: 17 U/L (ref 10–35)
BILIRUBIN TOTAL: 1 mg/dL (ref 0.2–1.2)
BUN / CREAT RATIO: 16 (calc) (ref 6–22)
BUN: 18 mg/dL (ref 7–25)
CHLORIDE: 102 mmol/L (ref 98–110)
CO2: 24 mmol/L (ref 20–32)
Calcium: 10 mg/dL (ref 8.6–10.4)
Creat: 1.12 mg/dL — ABNORMAL HIGH (ref 0.60–0.93)
GFR, EST AFRICAN AMERICAN: 54 mL/min/{1.73_m2} — AB (ref >=60)
GFR, Est Non African American: 47 mL/min/{1.73_m2} — ABNORMAL LOW (ref >=60)
GLUCOSE: 95 mg/dL (ref 65–99)
Globulin: 3.7 g/dL (calc) (ref 1.9–3.7)
Potassium: 5.1 mmol/L (ref 3.5–5.3)
SODIUM: 139 mmol/L (ref 135–146)
TOTAL PROTEIN: 8.2 g/dL — AB (ref 6.1–8.1)

## 2018-02-19 LAB — MEASLES/MUMPS/RUBELLA IMMUNITY: RUBELLA: 12.6 {index}

## 2018-02-19 LAB — TEST AUTHORIZATION

## 2018-02-19 LAB — LIPID PANEL
CHOL/HDL RATIO: 3.4 (calc) (ref <5.0)
Cholesterol: 135 mg/dL (ref <200)
HDL: 40 mg/dL — AB (ref >50)
LDL CHOLESTEROL (CALC): 76 mg/dL
Non-HDL Cholesterol (Calc): 95 mg/dL (calc) (ref <130)
TRIGLYCERIDES: 109 mg/dL (ref <150)

## 2018-02-19 LAB — VITAMIN D 25 HYDROXY (VIT D DEFICIENCY, FRACTURES): VIT D 25 HYDROXY: 44 ng/mL (ref 30–100)

## 2018-02-19 LAB — RHEUMATOID FACTOR: RHEUMATOID FACTOR: 16 [IU]/mL — AB (ref <14)

## 2018-02-19 LAB — C-REACTIVE PROTEIN: CRP: 5.7 mg/L (ref <8.0)

## 2018-02-19 LAB — SEDIMENTATION RATE: SED RATE: 19 mm/h (ref 0–30)

## 2018-02-19 LAB — ANA: Anti Nuclear Antibody(ANA): NEGATIVE

## 2018-02-19 LAB — CYCLIC CITRUL PEPTIDE ANTIBODY, IGG: Cyclic Citrullin Peptide Ab: <16 UNITS

## 2018-02-25 ENCOUNTER — Encounter: Payer: Self-pay | Admitting: Internal Medicine

## 2018-03-24 ENCOUNTER — Other Ambulatory Visit: Payer: Self-pay | Admitting: Internal Medicine

## 2018-03-24 ENCOUNTER — Telehealth: Payer: Self-pay | Admitting: Internal Medicine

## 2018-03-24 DIAGNOSIS — M25511 Pain in right shoulder: Principal | ICD-10-CM

## 2018-03-24 DIAGNOSIS — G8929 Other chronic pain: Secondary | ICD-10-CM

## 2018-03-24 NOTE — Telephone Encounter (Signed)
Xray in for right shoulder can come to clinic and get   [place]

## 2018-03-24 NOTE — Telephone Encounter (Signed)
Pt came by stating she was advised when she got back from vacation to stop in to get an xray of her rt shoulder. Order is needed. Please advise? Thank you!  Call pt 2 [phone].

## 2018-03-25 ENCOUNTER — Ambulatory Visit (INDEPENDENT_AMBULATORY_CARE_PROVIDER_SITE_OTHER): Payer: Medicare Other

## 2018-03-25 DIAGNOSIS — M25511 Pain in right shoulder: Secondary | ICD-10-CM | POA: Diagnosis not present

## 2018-03-25 DIAGNOSIS — M19011 Primary osteoarthritis, right shoulder: Secondary | ICD-10-CM | POA: Diagnosis not present

## 2018-03-25 DIAGNOSIS — G8929 Other chronic pain: Secondary | ICD-10-CM

## 2018-03-25 NOTE — Telephone Encounter (Signed)
Called and spoke with pt. Pt advised and voiced understanding.  

## 2018-03-29 ENCOUNTER — Other Ambulatory Visit: Payer: Self-pay

## 2018-03-29 DIAGNOSIS — Z1231 Encounter for screening mammogram for malignant neoplasm of breast: Secondary | ICD-10-CM

## 2018-03-30 ENCOUNTER — Other Ambulatory Visit: Payer: Self-pay | Admitting: Interventional Cardiology

## 2018-03-30 NOTE — Telephone Encounter (Signed)
Eliquis 5mg  refill request received; pt is 77 yrs old, wt-69.8kg, Crea-1.12 on 02/16/18, last seen by Dr. Irish Lack on 04/14/17 and has an appt on 05/24/2018; will send in refill to requested pharmacy.

## 2018-04-07 ENCOUNTER — Other Ambulatory Visit: Payer: Self-pay | Admitting: Interventional Cardiology

## 2018-04-07 ENCOUNTER — Ambulatory Visit (HOSPITAL_COMMUNITY)
Admission: RE | Admit: 2018-04-07 | Discharge: 2018-04-07 | Disposition: A | Discharge status: Home / Self Care | Payer: Medicare Other | Source: Ambulatory Visit | Attending: Cardiology | Admitting: Cardiology

## 2018-04-07 DIAGNOSIS — I6529 Occlusion and stenosis of unspecified carotid artery: Secondary | ICD-10-CM | POA: Insufficient documentation

## 2018-04-20 ENCOUNTER — Inpatient Hospital Stay (HOSPITAL_COMMUNITY): Admission: RE | Admit: 2018-04-20 | Payer: Medicare Other | Source: Ambulatory Visit

## 2018-04-23 ENCOUNTER — Ambulatory Visit (INDEPENDENT_AMBULATORY_CARE_PROVIDER_SITE_OTHER): Payer: Medicare Other

## 2018-04-23 ENCOUNTER — Encounter: Payer: Self-pay | Admitting: Internal Medicine

## 2018-04-23 ENCOUNTER — Ambulatory Visit (INDEPENDENT_AMBULATORY_CARE_PROVIDER_SITE_OTHER): Payer: Medicare Other | Admitting: Internal Medicine

## 2018-04-23 VITALS — BP 112/80 | HR 88 | Temp 97.6°F | Ht 60.0 in | Wt 149.4 lb

## 2018-04-23 DIAGNOSIS — D751 Secondary polycythemia: Secondary | ICD-10-CM | POA: Diagnosis not present

## 2018-04-23 DIAGNOSIS — R8290 Unspecified abnormal findings in urine: Secondary | ICD-10-CM

## 2018-04-23 DIAGNOSIS — I10 Essential (primary) hypertension: Secondary | ICD-10-CM | POA: Diagnosis not present

## 2018-04-23 DIAGNOSIS — I158 Other secondary hypertension: Secondary | ICD-10-CM

## 2018-04-23 DIAGNOSIS — R7303 Prediabetes: Secondary | ICD-10-CM | POA: Diagnosis not present

## 2018-04-23 DIAGNOSIS — I4891 Unspecified atrial fibrillation: Secondary | ICD-10-CM | POA: Diagnosis not present

## 2018-04-23 LAB — IRON,TIBC AND FERRITIN PANEL
%SAT: 24 % (calc) (ref 16–45)
FERRITIN: 290 ng/mL — AB (ref 16–288)
Iron: 76 ug/dL (ref 45–160)
TIBC: 315 mcg/dL (calc) (ref 250–450)

## 2018-04-23 NOTE — Progress Notes (Signed)
Chief Complaint  Patient presents with  . Follow-up   F/u doing well recently her brother died 54. Polycythemia disc with pt will w/u etiology today.  2. HTN controlled, Afib rate controlled f/u with Dr. Beau Fanny cardiology in [place] on Toprol XL 100 mg qd, Diltiazem 120 qd, eliquis and vasotec 5 mg qd  3. Reviewed labs last visit -Prediabetes A1C 5.7 disc healthy diet and exercise she does like to play golf   Review of Systems  Constitutional: Positive for weight loss.       Down 4 lbs  HENT: Negative for hearing loss.   Eyes: Negative for blurred vision.  Respiratory: Negative for shortness of breath.   Cardiovascular: Negative for chest pain.  Gastrointestinal: Negative for abdominal pain.  Musculoskeletal: Negative for falls.  Skin: Negative for rash.  Neurological: Negative for headaches.  Psychiatric/Behavioral: Negative for memory loss.   Past Medical History:  Diagnosis Date  . Arthritis    hands, knees, ankles  . BCC (basal cell carcinoma of skin) 2016   [place]fer  . Bilateral carotid artery stenosis    a. s/p right carotid stent after restenosis post CEA, left carotid stent (previously on Plavix for these). b. Carotid US 03/2016: stable 40-59% BICA.  Marland Kitchen Fibrocystic breast disease    followed by surgery Jamal Collin)  . GERD (gastroesophageal reflux disease)    OTC prilosec  . Hernia 2005  . History of chicken pox   . History of colon polyps   . History of melanoma 1990s   right shoulder  . HLD (hyperlipidemia)   . HTN (hypertension) 1970s  . Hypertensive retinopathy of both eyes, grade 1 2017   Bulakowski  . Kidney cyst, acquired 2016   large R septated 8cm, stable 2017  . Melanoma in situ ([place]) 2017   R medial posterior arm - lentio maligna type, margin involved ([place])  . PAF (paroxysmal atrial fibrillation) ([place])    a. dx 12/2015 at routine physical.   Past Surgical History:  Procedure Laterality Date  . ABI  2007   R 1.19, L 1.2  . APPENDECTOMY  1995   . BREAST BIOPSY Left 1997   left  . BREAST CYST ASPIRATION Left 02/1997   fna  . BREAST EXCISIONAL BIOPSY Right 1998   benign per pt  . CARDIOVASCULAR STRESS TEST  ~2003   WNL  . CAROTID ENDARTERECTOMY  2002   bilateral  . CAROTID STENT Right 2002   right after restenosis post CEA (Dr. Edd Arbour)  . CAROTID STENT  2014   for stenosis  . CAROTID STENT Left 2014   Dr. Ardeth Sportsman, [place]  . CESAREAN SECTION  1968; 1972  . COLONOSCOPY  03/2012   Sankar - polyp (rec rpt 5 yrs)  . EXPLORATORY LAPAROTOMY  1990s   for GI pain, inflamed lower GI, unrevealing workup, improved with abx  . HERNIA REPAIR  2005   abdomen  . MELANOMA EXCISION  1998   right shoulder  . TOTAL HIP ARTHROPLASTY  2005   bilat   Family History  Problem Relation Age of Onset  . Cancer Mother 77       lung, smoker  . Cancer Father 38       prostate  . Hypertension Paternal Grandfather   . CAD Neg Hx   . Stroke Neg Hx   . Diabetes Neg Hx   . Heart attack Neg Hx   . Breast cancer Neg Hx    Social History   Socioeconomic History  .  Marital status: Widowed    Spouse name: Not on file  . Number of children: Not on file  . Years of education: Not on file  . Highest education level: Not on file  Occupational History  . Not on file  Social Needs  . Financial resource strain: Not on file  . Food insecurity:    Worry: Not on file    Inability: Not on file  . Transportation needs:    Medical: Not on file    Non-medical: Not on file  Tobacco Use  . Smoking status: Former Smoker    Packs/day: 1.00    Years: 30.00    Pack years: 30.00  . Smokeless tobacco: Never Used  Substance and Sexual Activity  . Alcohol use: Yes    Alcohol/week: 0.0 standard drinks    Comment: 1 glass wine daily  . Drug use: No  . Sexual activity: Not on file  Lifestyle  . Physical activity:    Days per week: Not on file    Minutes per session: Not on file  . Stress: Not on file  Relationships  . Social connections:    Talks on  phone: Not on file    Gets together: Not on file    Attends religious service: Not on file    Active member of club or organization: Not on file    Attends meetings of clubs or organizations: Not on file    Relationship status: Not on file  . Intimate partner violence:    Fear of current or ex partner: Not on file    Emotionally abused: Not on file    Physically abused: Not on file    Forced sexual activity: Not on file  Other Topics Concern  . Not on file  Social History Narrative   Caffeine: 3 cups coffee/day   Widow of husband Jori Moll), lives with 1 dog.  Grown children   Occupation: homemaker   Edu: college   Activity: plays golf, walks dog, swims   Diet: good water, fruits/vegetables daily   Current Meds  Medication Sig  . atorvastatin (LIPITOR) 40 MG tablet TAKE 1 TABLET BY MOUTH AT  BEDTIME  . calcium-vitamin D (OSCAL WITH D) 500-200 MG-UNIT per tablet Take 1 tablet by mouth daily.  Marland Kitchen diltiazem (CARDIZEM CD) 120 MG 24 hr capsule TAKE 1 CAPSULE BY MOUTH  DAILY  . ELIQUIS 5 MG TABS tablet TAKE 1 TABLET BY MOUTH TWO  TIMES DAILY  . enalapril (VASOTEC) 5 MG tablet TAKE 1 TABLET BY MOUTH  DAILY.  . fluticasone (FLONASE) 50 MCG/ACT nasal spray Place 2 sprays into both nostrils daily as needed for allergies or rhinitis.  Marland Kitchen FLUZONE HIGH-DOSE 0.5 ML SUSY TO BE ADMINISTERED BY PHARMACIST FOR IMMUNIZATION  . metoprolol succinate (TOPROL-XL) 100 MG 24 hr tablet TAKE 1 TABLET BY MOUTH  DAILY  . Multiple Vitamin (MULTIVITAMIN) capsule Take 1 capsule by mouth daily.  Marland Kitchen triamcinolone cream (KENALOG) 0.1 % Apply 1 application topically daily as needed (skin cancer).   No Known Allergies Recent Results (from the past 2160 hour(s))  Sedimentation rate     Status: None   Collection Time: 02/16/18  9:24 AM  Result Value Ref Range   Sed Rate 19 0 - 30 mm/h  C-reactive protein     Status: None   Collection Time: 02/16/18  9:24 AM  Result Value Ref Range   CRP 5.7 <8.0 mg/L  Vitamin D (25  hydroxy)     Status: None  Collection Time: 02/16/18  9:24 AM  Result Value Ref Range   Vit D, 25-Hydroxy 44 30 - 100 ng/mL    Comment: Vitamin D Status         25-OH Vitamin D: . Deficiency:                    <20 ng/mL Insufficiency:             20 - 29 ng/mL Optimal:                 > or = 30 ng/mL . For 25-OH Vitamin D testing on patients on  D2-supplementation and patients for whom quantitation  of D2 and D3 fractions is required, the QuestAssureD(TM) 25-OH VIT D, (D2,D3), LC/MS/MS is recommended: order  code [id number] (patients >54yrs). . For more information on this test, go to: http://education.questdiagn[facility].com/faq/FAQ163 (This link is being provided for  informational/educational purposes only.)   Hemoglobin A1c     Status: Abnormal   Collection Time: 02/16/18  9:24 AM  Result Value Ref Range   Hgb A1c MFr Bld 5.7 (H) <5.7 % of total Hgb    Comment: For someone without known diabetes, a hemoglobin  A1c value between 5.7% and 6.4% is consistent with prediabetes and should be confirmed with a  follow-up test. . For someone with known diabetes, a value <7% indicates that their diabetes is well controlled. A1c targets should be individualized based on duration of diabetes, age, comorbid conditions, and other considerations. . This assay result is consistent with an increased risk of diabetes. . Currently, no consensus exists regarding use of hemoglobin A1c for diagnosis of diabetes for children. .    Mean Plasma Glucose 117 (calc)   eAG (mmol/L) 6.5 (calc)  TSH     Status: None   Collection Time: 02/16/18  9:24 AM  Result Value Ref Range   TSH 1.88 0.40 - 4.50 mIU/L  CBC with Differential/Platelet     Status: Abnormal   Collection Time: 02/16/18  9:24 AM  Result Value Ref Range   WBC 5.8 3.8 - 10.8 Thousand/uL   RBC 4.93 3.80 - 5.10 Million/uL   Hemoglobin 16.1 (H) 11.7 - 15.5 g/dL   HCT 47.5 (H) 35.0 - 45.0 %   MCV 96.3 80.0 - 100.0 fL   MCH 32.7 27.0 -  33.0 pg   MCHC 33.9 32.0 - 36.0 g/dL   RDW 13.1 11.0 - 15.0 %   Platelets 207 140 - 400 Thousand/uL   MPV 11.2 7.5 - 12.5 fL   Neutro Abs 3,062 1,500 - 7,800 cells/uL   Lymphs Abs 2,007 850 - 3,900 cells/uL   WBC mixed population 574 200 - 950 cells/uL   Eosinophils Absolute 99 15 - 500 cells/uL   Basophils Absolute 58 0 - 200 cells/uL   Neutrophils Relative % 52.8 %   Total Lymphocyte 34.6 %   Monocytes Relative 9.9 %   Eosinophils Relative 1.7 %   Basophils Relative 1.0 %  Comprehensive metabolic panel     Status: Abnormal   Collection Time: 02/16/18  9:24 AM  Result Value Ref Range   Glucose, Bld 95 65 - 99 mg/dL    Comment: .            Fasting reference interval .    BUN 18 7 - 25 mg/dL   Creat 1.12 (H) 0.60 - 0.93 mg/dL    Comment: For patients >23 years of age, the reference limit for Creatinine is  approximately 13% higher for people identified as African-American. .    BUN/Creatinine Ratio 16 6 - 22 (calc)   Sodium 139 135 - 146 mmol/L   Potassium 5.1 3.5 - 5.3 mmol/L   Chloride 102 98 - 110 mmol/L   CO2 24 20 - 32 mmol/L   Calcium 10.0 8.6 - 10.4 mg/dL   Total Protein 8.2 (H) 6.1 - 8.1 g/dL   Albumin 4.5 3.6 - 5.1 g/dL   Globulin 3.7 1.9 - 3.7 g/dL (calc)   AG Ratio 1.2 1.0 - 2.5 (calc)   Total Bilirubin 1.0 0.2 - 1.2 mg/dL   Alkaline phosphatase (APISO) 102 33 - 130 U/L   AST 17 10 - 35 U/L   ALT 16 6 - 29 U/L  Lipid panel     Status: Abnormal   Collection Time: 02/16/18  9:24 AM  Result Value Ref Range   Cholesterol 135 <200 mg/dL   HDL 40 (L) >50 mg/dL   Triglycerides 109 <150 mg/dL   LDL Cholesterol (Calc) 76 mg/dL (calc)    Comment: Reference range: <100 . Desirable range <100 mg/dL for primary prevention;   <70 mg/dL for patients with CHD or diabetic patients  with > or = 2 CHD risk factors. Marland Kitchen LDL-C is now calculated using the Martin-Hopkins  calculation, which is a validated novel method providing  better accuracy than the Friedewald equation  in the  estimation of LDL-C.  Cresenciano Genre et al. Annamaria Helling. 9326;712(45): 2061-2068  (http://education.[facility].com/faq/FAQ164)    Total CHOL/HDL Ratio 3.4 <5.0 (calc)   Non-HDL Cholesterol (Calc) 95 <130 mg/dL (calc)    Comment: For patients with diabetes plus 1 major ASCVD risk  factor, treating to a non-HDL-C goal of <100 mg/dL  (LDL-C of <70 mg/dL) is considered a therapeutic  option.   Antinuclear Antib (ANA)     Status: None   Collection Time: 02/16/18  9:24 AM  Result Value Ref Range   Anti Nuclear Antibody(ANA) NEGATIVE NEGATIVE    Comment: ANA IFA is a first line screen for detecting the presence of up to approximately 150 autoantibodies in various autoimmune diseases. A negative ANA IFA result suggests an ANA-associated autoimmune disease is not present at this time, but is not definitive. If there is high clinical suspicion for Sjogren's syndrome, testing for anti-SS-A/Ro antibody should be considered. Anti-Jo-1 antibody should be considered for clinically suspected inflammatory myopathies. . AC-0: Negative . International Consensus on ANA Patterns (https://www.hernandez-brewer.com/) . For additional information, please refer to http://education.[facility].com/faq/FAQ177 (This link is being provided for informational/ educational purposes only.) .   Cyclic citrul peptide antibody, IgG     Status: None   Collection Time: 02/16/18  9:24 AM  Result Value Ref Range   Cyclic Citrullin Peptide Ab <16 UNITS    Comment: Reference Range Negative:            <20 Weak Positive:       20-39 Moderate Positive:   40-59 Strong Positive:     >59 .   Rheumatoid Factor     Status: Abnormal   Collection Time: 02/16/18  9:24 AM  Result Value Ref Range   Rhuematoid fact SerPl-aCnc 16 (H) <14 IU/mL  Measles/Mumps/Rubella Immunity     Status: None   Collection Time: 02/16/18  9:24 AM  Result Value Ref Range   Rubeola IgG >300.00 AU/mL    Comment: AU/mL             Interpretation -----            -------------- <  25.00           Negative 25.00-29.99      Equivocal >29.99           Positive . A positive result indicates that the patient has antibody to measles virus. It does not differentiate  between an active or past infection. The clinical  diagnosis must be interpreted in conjunction with  clinical signs and symptoms of the patient.    Mumps IgG >300.00 AU/mL    Comment:  AU/mL           Interpretation -------         ---------------- <9.00             Negative 9.00-10.99        Equivocal >10.99            Positive A positive result indicates that the patient has  antibody to mumps virus. It does not differentiate between an  active or past infection. The clinical diagnosis must be interpreted in conjunction with clinical signs and symptoms of the patient. .    Rubella 12.60 index    Comment:     Index            Interpretation     -----            --------------       <0.90            Not consistent with Immunity     0.90-0.99        Equivocal     > or = 1.00      Consistent with Immunity  . The presence of rubella IgG antibody suggests  immunization or past or current infection with rubella virus.   COMPLETE METABOLIC PANEL WITH GFR     Status: Abnormal   Collection Time: 02/16/18  9:24 AM  Result Value Ref Range   Glucose, Bld 95 65 - 99 mg/dL    Comment: .            Fasting reference interval .    BUN 18 7 - 25 mg/dL   Creat 1.12 (H) 0.60 - 0.93 mg/dL    Comment: For patients >18 years of age, the reference limit for Creatinine is approximately 13% higher for people identified as African-American. .    GFR, Est Non African American 47 (L) > OR = 60 mL/min/1.84m2   GFR, Est African American 54 (L) > OR = 60 mL/min/1.43m2   BUN/Creatinine Ratio 16 6 - 22 (calc)   Sodium 139 135 - 146 mmol/L   Potassium 5.1 3.5 - 5.3 mmol/L   Chloride 102 98 - 110 mmol/L   CO2 24 20 - 32 mmol/L   Calcium 10.0 8.6 - 10.4 mg/dL    Total Protein 8.2 (H) 6.1 - 8.1 g/dL   Albumin 4.5 3.6 - 5.1 g/dL   Globulin 3.7 1.9 - 3.7 g/dL (calc)   AG Ratio 1.2 1.0 - 2.5 (calc)   Total Bilirubin 1.0 0.2 - 1.2 mg/dL   Alkaline phosphatase (APISO) 102 33 - 130 U/L   AST 17 10 - 35 U/L   ALT 16 6 - 29 U/L  TEST AUTHORIZATION     Status: None   Collection Time: 02/16/18  9:24 AM  Result Value Ref Range   TEST NAME: COMPREHENSIVE METABOLIC    TEST CODE: [id number]    CLIENT CONTACT: MCLEAN-SCOCUZZA,TRAC    REPORT ALWAYS MESSAGE SIGNATURE      Comment: . The  laboratory testing on this patient was verbally requested or confirmed by the ordering physician or his or her authorized representative after contact with an employee of [employer]. Federal regulations require that we maintain on file written authorization for all laboratory testing.  Accordingly we are asking that the ordering physician or his or her authorized representative sign a copy of this report and promptly return it to the client service representative. . . Signature:____________________________________________________ . Please fax this signed page to [phone] or return it via your [employer] courier.    Objective  Body mass index is 29.18 kg/m. Wt Readings from Last 3 Encounters:  04/23/18 149 lb 6.4 oz (67.8 kg)  02/11/18 153 lb 12.8 oz (69.8 kg)  06/02/17 152 lb (68.9 kg)   Temp Readings from Last 3 Encounters:  04/23/18 97.6 F (36.4 C) (Oral)  02/11/18 98.2 F (36.8 C) (Oral)  12/31/15 98 F (36.7 C) (Oral)   BP Readings from Last 3 Encounters:  04/23/18 112/80  02/11/18 112/76  06/02/17 128/70   Pulse Readings from Last 3 Encounters:  04/23/18 88  02/11/18 81  06/02/17 86    Physical Exam Vitals signs and nursing note reviewed.  Constitutional:      Appearance: Normal appearance. She is overweight.  HENT:     Head: Normocephalic and atraumatic.     Mouth/Throat:     Mouth: Mucous membranes are moist.      Pharynx: Oropharynx is clear.  Eyes:     Conjunctiva/sclera: Conjunctivae normal.     Pupils: Pupils are equal, round, and reactive to light.  Cardiovascular:     Rate and Rhythm: Normal rate. Rhythm irregular.     Comments: Afib Pulmonary:     Effort: Pulmonary effort is normal.     Breath sounds: Normal breath sounds.  Skin:    General: Skin is warm and dry.  Neurological:     General: No focal deficit present.     Mental Status: She is alert and oriented to person, place, and time.     Gait: Gait normal.  Psychiatric:        Attention and Perception: Attention and perception normal.        Mood and Affect: Mood and affect normal.        Speech: Speech normal.        Behavior: Behavior normal. Behavior is cooperative.        Thought Content: Thought content normal.        Cognition and Memory: Cognition and memory normal.        Judgment: Judgment normal.     Assessment   1. Polycythemia ? Etiology w/u Jak 2, EPO, anemia panel, CXR today 2. HTN controlled, Afib rate controlled  3. Prediabetes A1C 5.7 02/2018  4. HM Plan   1. Repeat CBC H/H normal but has had persistent polycythemia on and off chronically  W/u JAk 2, EPO, anemia panel CXR today neg except aortic atherosclerosis  Will disc with Dr. Irish Lack if he will consider repeat echo looking for shunting or sleep study to r/o hypoxia nocturnally vs osa  -she will see 05/2018  2. Cont meds and f/u cards as above  3. Disc healthy diet and exercise  4.  Had flu shot 02/02/18  rec Tdap per pt had 10/13/12 consider in 2024 unclear if had Td vs Tdap Given rx shingrix vaccine has not had yet  pna 21 utd and prevnar utd  Check fasting labs upcoming   mammo 05/27/17  neg referred another sch 06/07/18 sch dEXa 06/07/18  Colonoscopy 03/03/12 Sankar polyps x 2 hyperplastic f/u with Dr. Bary Castilla likely no other needed  Pap records [facility]  -09/06/12 neg pap neg HPV Dr. Laurey Morale [facility]. atrophy+ Former smoker assess at f/u # years  and [place].  Dermatology [place] derm saw 03/2018 had right forearm lesion and will f/u 06/08/18 h/o skin cancer MM and NMSC    Provider: Dr. Olivia Mackie McLean-Scocuzza-[facility]

## 2018-04-23 NOTE — Progress Notes (Signed)
Pre visit review using our clinic review tool, if applicable. No additional management support is needed unless otherwise documented below in the visit note. 

## 2018-04-23 NOTE — Patient Instructions (Addendum)
Ask cardiologist about what they think about considering home sleep study given elevated blood counts or repeat echo     Polycythemia Vera-You do not have this yet but we are checking  Polycythemia vera (PV), or myeloproliferative disease, is a form of blood cancer in which the bone marrow makes too many (overproduces) red blood cells. The bone marrow may also make too many clotting cells (platelets) and white blood cells. Bone marrow is the spongy center of bones where blood cells are produced. Sometimes, there may be an overproduction of blood cells in the liver and spleen, causing those organs to become enlarged. Additionally, people who have PV are at a higher risk for stroke or heart attack because their blood may clot more easily. PV is a long-term disease. What are the causes? Almost all people who have PV have an abnormal gene (genetic mutation) that causes changes in the way that the bone marrow makes blood cells. This gene, which is called JAK2, is not passed along from parent to child (is not hereditary). It is not known what triggers the genetic mutation that causes the body to produce too many red blood cells. What increases the risk? This condition is more likely to develop in:  Males.  People who are 72 years of age or older.  What are the signs or symptoms? You may not have any symptoms in the early stage of PV. When symptoms develop, they may include:  Shortness of breath.  Dizziness.  Hot and flushed skin.  Itchy skin.  Sweats, especially night sweats.  Headache.  Tiredness.  Ringing in the ears.  Blurred vision or blind spots.  Bone pain.  Weight loss.  Fever.  Blood-tinged vomit or bowel movements.  How is this diagnosed? This condition may be diagnosed during a routine physical exam if you have a blood test called a complete blood count (CBC). Your health care provider also may suspect PV if you have symptoms. During the physical exam, your provider  may find that you have an enlarged liver or spleen. You may also have tests to confirm the diagnosis. These may include:  A procedure to remove a sample of bone marrow for testing (bone marrow biopsy).  Blood tests to check for: ? The JAK2 gene. ? Low levels of a hormone that helps to regulate blood production (erythropoietin).  How is this treated? There is no cure for PV, but treatment can help to control the disease. There are several types of treatment. No single treatment works for everyone. You will need to work with a blood cancer specialist (hematologist) to find the treatment that is best for you. Options include:  Periodically having some blood removed with a needle (drawn) to lower the number of red blood cells (phlebotomy).  Medicine. Your health care provider may recommend: ? Low-dose aspirin to lower your risk for blood clots. ? A medicine to reduce red blood cell production (hydroxyurea). ? A medicine to lower the number of red blood cells (interferon). ? A medicine that slows down the effects of JAK2 (ruxolitinib).  Follow these instructions at home:  Take over-the-counter and prescription medicines only as told by your health care provider.  Return to your normal activities as told by your health care provider. Ask your health care provider what activities are safe for you.  Do not use tobacco products, including cigarettes, chewing tobacco, or e-cigarettes. If you need help quitting, ask your health care provider.  Keep all follow-up visits as told by your  health care provider. This is important. Contact a health care provider if:  You have side effects from your medicines.  Your symptoms change or get worse at home.  You have blood in your stool or you vomit blood. Get help right away if:  You have sudden and severe pain in your abdomen.  You have chest pain or difficulty breathing.  You have signs of stroke, such as: ? Sudden numbness. ? Weakness of your  face or arm. ? Confusion. ? Difficulty speaking or understanding speech. These symptoms may represent a serious problem that is an emergency. Do not wait to see if the symptoms will go away. Get medical help right away. Call your local emergency services (911 in the [place] Do not drive yourself to the hospital. This information is not intended to replace advice given to you by your health care provider. Make sure you discuss any questions you have with your health care provider. Document Released: 01/21/2001 Document Revised: 10/04/2015 Document Reviewed: 11/08/2014 Elsevier Interactive Patient Education  [employer].

## 2018-04-24 LAB — URINALYSIS, ROUTINE W REFLEX MICROSCOPIC
BILIRUBIN URINE: NEGATIVE
Glucose, UA: NEGATIVE
Hyaline Cast: NONE SEEN /LPF
Ketones, ur: NEGATIVE
NITRITE: POSITIVE — AB
Protein, ur: NEGATIVE
SPECIFIC GRAVITY, URINE: 1.012 (ref 1.001–1.03)
SQUAMOUS EPITHELIAL / LPF: NONE SEEN /HPF (ref <=5)
WBC, UA: >=60 /HPF — AB (ref 0–5)
pH: 7 (ref 5.0–8.0)

## 2018-04-26 ENCOUNTER — Encounter: Payer: Self-pay | Admitting: Internal Medicine

## 2018-04-26 DIAGNOSIS — R7303 Prediabetes: Secondary | ICD-10-CM | POA: Insufficient documentation

## 2018-04-28 LAB — PATHOLOGIST SMEAR REVIEW
BASOS ABS: 0.1 10*3/uL (ref 0.0–0.2)
Basos: 1 %
EOS (ABSOLUTE): 0.1 10*3/uL (ref 0.0–0.4)
Eos: 2 %
HEMATOCRIT: 45.1 % (ref 34.0–46.6)
Hemoglobin: 15.4 g/dL (ref 11.1–15.9)
Immature Grans (Abs): 0 10*3/uL (ref 0.0–0.1)
Immature Granulocytes: 0 %
Lymphocytes Absolute: 1.4 10*3/uL (ref 0.7–3.1)
Lymphs: 26 %
MCH: 32.4 pg (ref 26.6–33.0)
MCHC: 34.1 g/dL (ref 31.5–35.7)
MCV: 95 fL (ref 79–97)
MONOS ABS: 0.6 10*3/uL (ref 0.1–0.9)
Monocytes: 11 %
Neutrophils Absolute: 3.3 10*3/uL (ref 1.4–7.0)
Neutrophils: 60 %
Path Rev PLTs: NORMAL
Path Rev RBC: NORMAL
Path Rev WBC: NORMAL
Platelets: 193 10*3/uL (ref 150–450)
RBC: 4.75 x10E6/uL (ref 3.77–5.28)
RDW: 13.1 % (ref 12.3–15.4)
WBC: 5.5 10*3/uL (ref 3.4–10.8)

## 2018-04-28 LAB — ERYTHROPOIETIN: ERYTHROPOIETIN: 34.4 m[IU]/mL — AB (ref 2.6–18.5)

## 2018-04-29 LAB — SPECIMEN STATUS REPORT

## 2018-05-11 ENCOUNTER — Other Ambulatory Visit: Payer: Self-pay | Admitting: Internal Medicine

## 2018-05-11 DIAGNOSIS — N281 Cyst of kidney, acquired: Secondary | ICD-10-CM

## 2018-05-11 DIAGNOSIS — D751 Secondary polycythemia: Secondary | ICD-10-CM

## 2018-05-11 DIAGNOSIS — R319 Hematuria, unspecified: Secondary | ICD-10-CM

## 2018-05-13 LAB — JAK2 EXONS 12-15

## 2018-05-13 LAB — SPECIMEN STATUS REPORT

## 2018-05-13 LAB — STATUS REPORT

## 2018-05-18 LAB — JAK2  V617F QUAL. WITH REFLEX TO EXON 12

## 2018-05-20 NOTE — Progress Notes (Signed)
Cardiology Office Note   Date:  05/24/2018   ID:  [name], DOB [birth year], MRN [id number]  PCP:  McLean-Scocuzza, Rebecca Glow, MD    No chief complaint on file.  PAD/Carotid disease  Wt Readings from Last 3 Encounters:  05/24/18 149 lb (67.6 kg)  04/23/18 149 lb 6.4 oz (67.8 kg)  02/11/18 153 lb 12.8 oz (69.8 kg)       History of Present Illness: [name] is a 78 y.o. female  who has had a right carotid stent after restenosis post CEA. She saw Dr. Edd Mcgee at [facility], now at [facility]. She had been following with him for a while but came to me for local care. SHe has not had heart probelms. She has had a stress test many years ago which was negative. She had a left carotid stent done in 06/22/2022.  Her husband passed away in 02/22/2023. She is eating well. Daughter lives in [name] and sees my sister in law for a PMD.   She was diagnosed with AFib earlier in 2017. She was asymptomatic. She had no bleeding problems after starting Eliquis.  She was doing Chief of Staff.  She has a cyst on her kidneys.  THis is being followed by CT.   She has had some tightness in her arms.  It has occurred at random.  Not related to activity.  She has had tightness in her calves with walking.  She can slow down and it improves, speeds up and it gets worse.    Denies : Chest pain. Dizziness. Leg edema. Nitroglycerin use. Orthopnea. Palpitations. Paroxysmal nocturnal dyspnea. Shortness of breath. Syncope.     Past Medical History:  Diagnosis Date  . Arthritis    hands, knees, ankles  . BCC (basal cell carcinoma of skin) 2016   [name]  . Bilateral carotid artery stenosis    a. s/p right carotid stent after restenosis post CEA, left carotid stent (previously on Plavix for these). b. Carotid US 03/2016: stable 40-59% BICA.  Marland Kitchen Fibrocystic breast disease    followed by surgery [name])  . GERD (gastroesophageal reflux disease)    OTC prilosec  . Hernia 2005  . History of chicken pox     . History of colon polyps   . History of melanoma 1990s   right shoulder  . HLD (hyperlipidemia)   . HTN (hypertension) 1970s  . Hypertensive retinopathy of both eyes, grade 1 2017   [name]  . Kidney cyst, acquired 2016   large R septated 8cm, stable 2017  . Melanoma in situ ([place]) 2017   R medial posterior arm - lentio maligna type, margin involved ([place])  . PAF (paroxysmal atrial fibrillation) ([place])    a. dx 12/2015 at routine physical.    Past Surgical History:  Procedure Laterality Date  . ABI  2007   R 1.19, L 1.2  . APPENDECTOMY  1995  . BREAST BIOPSY Left 1997   left  . BREAST CYST ASPIRATION Left 02/1997   fna  . BREAST EXCISIONAL BIOPSY Right 1998   benign per pt  . CARDIOVASCULAR STRESS TEST  ~2003   WNL  . CAROTID ENDARTERECTOMY  2002   bilateral  . CAROTID STENT Right 2002   right after restenosis post CEA (Dr. Edd Mcgee)  . CAROTID STENT  2014   for stenosis  . CAROTID STENT Left 2014   Dr. Ardeth Mcgee, [place]  . CESAREAN SECTION  1968; 1972  . COLONOSCOPY  03/2012  [name] - polyp (rec rpt 5 yrs)  . EXPLORATORY LAPAROTOMY  1990s   for GI pain, inflamed lower GI, unrevealing workup, improved with abx  . HERNIA REPAIR  2005   abdomen  . MELANOMA EXCISION  1998   right shoulder  . TOTAL HIP ARTHROPLASTY  2005   bilat     Current Outpatient Medications  Medication Sig Dispense Refill  . atorvastatin (LIPITOR) 40 MG tablet TAKE 1 TABLET BY MOUTH AT  BEDTIME 90 tablet 3  . calcium-vitamin D (OSCAL WITH D) 500-200 MG-UNIT per tablet Take 1 tablet by mouth daily.    Marland Kitchen diltiazem (CARDIZEM CD) 120 MG 24 hr capsule TAKE 1 CAPSULE BY MOUTH  DAILY 90 capsule 3  . ELIQUIS 5 MG TABS tablet TAKE 1 TABLET BY MOUTH TWO  TIMES DAILY 180 tablet 1  . enalapril (VASOTEC) 5 MG tablet TAKE 1 TABLET BY MOUTH  DAILY. 90 tablet 3  . fluticasone (FLONASE) 50 MCG/ACT nasal spray Place 2 sprays into both nostrils daily as needed for allergies or rhinitis.    Marland Kitchen FLUZONE  HIGH-DOSE 0.5 ML SUSY TO BE ADMINISTERED BY PHARMACIST FOR IMMUNIZATION  0  . metoprolol succinate (TOPROL-XL) 100 MG 24 hr tablet TAKE 1 TABLET BY MOUTH  DAILY 90 tablet 3  . Multiple Vitamin (MULTIVITAMIN) capsule Take 1 capsule by mouth daily.    Marland Kitchen triamcinolone cream (KENALOG) 0.1 % Apply 1 application topically daily as needed (skin cancer).     No current facility-administered medications for this visit.     Allergies:   Patient has no known allergies.    Social History:  The patient  reports that she has quit smoking. She has a 30.00 pack-year smoking history. She has never used smokeless tobacco. She reports current alcohol use. She reports that she does not use drugs.   Family History:  The patient's \ family history includes Cancer (age of onset: 19) in her father; Cancer (age of onset: 17) in her mother; Hypertension in her paternal grandfather.    ROS:  Please see the history of present illness.   Otherwise, review of systems are positive for bilateral claudication, no foot ulcers.   All other systems are reviewed and negative.    PHYSICAL EXAM: VS:  BP 106/70   Pulse 86   Ht 5' (1.524 m)   Wt 149 lb (67.6 kg)   SpO2 95%   BMI 29.10 kg/m  , BMI Body mass index is 29.1 kg/m. GEN: Well nourished, well developed, in no acute distress  HEENT: normal  Neck: no JVD, carotid bruits, or masses Cardiac: irregularly irregular; no murmurs, rubs, or gallops,no edema ; diminished left radial pulse compared to the right Respiratory:  clear to auscultation bilaterally, normal work of breathing GI: soft, nontender, nondistended, + BS MS: no deformity or atrophy  Skin: warm and dry, no rash Neuro:  Strength and sensation are intact Psych: euthymic mood, full affect   EKG:   The ekg ordered today demonstrates atrial fibrillation with controlled ventricular response   Recent Labs: 02/16/2018: ALT 16; ALT 16; BUN 18; BUN 18; Creat 1.12; Creat 1.12; Potassium 5.1; Potassium 5.1;  Sodium 139; Sodium 139; TSH 1.88 04/23/2018: Hemoglobin 15.4; Platelets 193   Lipid Panel    Component Value Date/Time   CHOL 135 02/16/2018 0924   TRIG 109 02/16/2018 0924   TRIG 144 03/28/2011   HDL 40 (L) 02/16/2018 0924   CHOLHDL 3.4 02/16/2018 0924   VLDL 23.0 04/15/2017 0744  [place] 76 02/16/2018 0924   LDLDIRECT 78 03/28/2011     Other studies Reviewed: Additional studies/ records that were reviewed today with results demonstrating: labs reviewed; most recent carotid showed normal flow in bilateral subclavian arteries.   ASSESSMENT AND PLAN:  1. Carotid artery disease: Continue with routine carotid Dopplers.  Left radial pulse decreased.  Symptoms of claudication as well.  We will plan for Dopplers of the left arm and both legs. 2. AFib: Rate controlled.  Eliquis for stroke prevention.  No signs of bleeding.  Hemoglobin and creatinine were stable at last check. 3. Hypertensive heart disease: The current medical regimen is effective;  continue present plan and medications. 4. Hyperlipidemia: Lipids well controlled. 5. Anticoagulated: Eliquis for stroke prevention.  Labs have been stable.   Current medicines are reviewed at length with the patient today.  The patient concerns regarding her medicines were addressed.  The following changes have been made:  No change  Labs/ tests ordered today include:  No orders of the defined types were placed in this encounter.   Recommend 150 minutes/week of aerobic exercise Low fat, low carb, high fiber diet recommended  Disposition:   FU in 6 months   Signed, Larae Grooms, MD  05/24/2018 1:31 PM    [facility] [address], [place], [place]  [address] Phone: [phone]; Fax: [phone]

## 2018-05-24 ENCOUNTER — Encounter (INDEPENDENT_AMBULATORY_CARE_PROVIDER_SITE_OTHER): Payer: Self-pay

## 2018-05-24 ENCOUNTER — Encounter: Payer: Self-pay | Admitting: Interventional Cardiology

## 2018-05-24 ENCOUNTER — Ambulatory Visit: Payer: Medicare Other | Admitting: Interventional Cardiology

## 2018-05-24 VITALS — BP 106/70 | HR 86 | Ht 60.0 in | Wt 149.0 lb

## 2018-05-24 DIAGNOSIS — I119 Hypertensive heart disease without heart failure: Secondary | ICD-10-CM

## 2018-05-24 DIAGNOSIS — E782 Mixed hyperlipidemia: Secondary | ICD-10-CM | POA: Diagnosis not present

## 2018-05-24 DIAGNOSIS — I4891 Unspecified atrial fibrillation: Secondary | ICD-10-CM

## 2018-05-24 DIAGNOSIS — I6523 Occlusion and stenosis of bilateral carotid arteries: Secondary | ICD-10-CM | POA: Diagnosis not present

## 2018-05-24 DIAGNOSIS — Z7901 Long term (current) use of anticoagulants: Secondary | ICD-10-CM | POA: Diagnosis not present

## 2018-05-24 DIAGNOSIS — R0989 Other specified symptoms and signs involving the circulatory and respiratory systems: Secondary | ICD-10-CM

## 2018-05-24 DIAGNOSIS — I739 Peripheral vascular disease, unspecified: Secondary | ICD-10-CM

## 2018-05-24 MED ORDER — METOPROLOL SUCCINATE ER 100 MG PO TB24: 100.0000 mg | ORAL_TABLET | Freq: Every day | ORAL | 3 refills | Status: DC

## 2018-05-24 MED ORDER — ENALAPRIL MALEATE 5 MG PO TABS: 5.0000 mg | ORAL_TABLET | Freq: Every day | ORAL | 3 refills | Status: DC

## 2018-05-24 MED ORDER — ATORVASTATIN CALCIUM 40 MG PO TABS: 40.0000 mg | ORAL_TABLET | Freq: Every day | ORAL | 3 refills | Status: DC

## 2018-05-24 MED ORDER — APIXABAN 5 MG PO TABS: 5.0000 mg | ORAL_TABLET | Freq: Two times a day (BID) | ORAL | 1 refills | Status: DC

## 2018-05-24 MED ORDER — DILTIAZEM HCL ER COATED BEADS 120 MG PO CP24: 120.0000 mg | ORAL_CAPSULE | Freq: Every day | ORAL | 3 refills | Status: DC

## 2018-05-24 NOTE — Patient Instructions (Signed)
Medication Instructions:  Your physician recommends that you continue on your current medications as directed. Please refer to the Current Medication list given to you today.  If you need a refill on your cardiac medications before your next appointment, please call your pharmacy.   Lab work: None Ordered  If you have labs (blood work) drawn today and your tests are completely normal, you will receive your results only by: Marland Kitchen MyChart Message (if you have MyChart) OR . A paper copy in the mail If you have any lab test that is abnormal or we need to change your treatment, we will call you to review the results.  Testing/Procedures: Your physician has requested that you have a LOWER extremity arterial duplex. This test is an ultrasound of the arteries in the legs. It looks at arterial blood flow in the legs. Allow one hour for Lower Arterial scans. There are no restrictions or special instructions.  Your physician has requested that you have a LEFT UPPER extremity arterial duplex. This test is an ultrasound of the arteries in the arms. It looks at arterial blood flow in the arms. Allow one hour for Upper Arterial scans. There are no restrictions or special instructions.  Follow-Up: At [facility], you and your health needs are our priority.  As part of our continuing mission to provide you with exceptional heart care, we have created designated Provider Care Teams.  These Care Teams include your primary Cardiologist (physician) and Advanced Practice Providers (APPs -  Physician Assistants and Nurse Practitioners) who all work together to provide you with the care you need, when you need it. . You will need a follow up appointment in 6 months.  Please call our office 2 months in advance to schedule this appointment.  You may see Casandra Doffing, MD or one of the following Advanced Practice Providers on your designated Care Team:   . Lyda Jester, PA-C . Dayna Dunn, PA-C . Ermalinda Barrios,  PA-C  Any Other Special Instructions Will Be Listed Below (If Applicable).

## 2018-05-25 ENCOUNTER — Ambulatory Visit
Admission: RE | Admit: 2018-05-25 | Discharge: 2018-05-25 | Disposition: A | Discharge status: Home / Self Care | Payer: Medicare Other | Source: Ambulatory Visit | Attending: Internal Medicine | Admitting: Internal Medicine

## 2018-05-25 DIAGNOSIS — D751 Secondary polycythemia: Secondary | ICD-10-CM | POA: Diagnosis not present

## 2018-05-25 DIAGNOSIS — N281 Cyst of kidney, acquired: Secondary | ICD-10-CM | POA: Diagnosis not present

## 2018-05-25 DIAGNOSIS — R319 Hematuria, unspecified: Secondary | ICD-10-CM | POA: Diagnosis not present

## 2018-05-28 ENCOUNTER — Other Ambulatory Visit: Payer: Self-pay | Admitting: Interventional Cardiology

## 2018-05-28 ENCOUNTER — Telehealth: Payer: Self-pay | Admitting: Interventional Cardiology

## 2018-05-28 MED ORDER — ATORVASTATIN CALCIUM 40 MG PO TABS: 40.0000 mg | ORAL_TABLET | Freq: Every day | ORAL | 3 refills | Status: DC

## 2018-05-28 MED ORDER — METOPROLOL SUCCINATE ER 100 MG PO TB24: 100.0000 mg | ORAL_TABLET | Freq: Every day | ORAL | 3 refills | Status: DC

## 2018-05-28 MED ORDER — ENALAPRIL MALEATE 5 MG PO TABS: 5.0000 mg | ORAL_TABLET | Freq: Every day | ORAL | 3 refills | Status: DC

## 2018-05-28 MED ORDER — APIXABAN 5 MG PO TABS: 5.0000 mg | ORAL_TABLET | Freq: Two times a day (BID) | ORAL | 1 refills | Status: DC

## 2018-05-28 MED ORDER — DILTIAZEM HCL ER COATED BEADS 120 MG PO CP24: 120.0000 mg | ORAL_CAPSULE | Freq: Every day | ORAL | 3 refills | Status: DC

## 2018-05-28 NOTE — Telephone Encounter (Signed)
Pt's medications that were sent to the wrong pharmacy at [facility] with Dr. Irish Lack on 05/24/18. I resent the pt's medications to the correct pharmacy [facility] as the pt requested. Confirmation received.

## 2018-05-28 NOTE — Telephone Encounter (Signed)
Pt called stating that her medications were sent to the wrong pharmacy. I resent pt's medications that were sent in at the [facility] 05/24/18 with Dr. Irish Lack to [facility] mail order pharmacy. Confirmation received.

## 2018-05-28 NOTE — Telephone Encounter (Signed)
New Message     *STAT* If patient is at the pharmacy, call can be transferred to refill team.   1. Which medications need to be refilled? (please list name of each medication and dose if known) apixaban (ELIQUIS) 5 MG TABS tablet, atorvastatin (LIPITOR) 40 MG tablet ,  diltiazem (CARDIZEM CD) 120 MG 24 hr capsule, enalapril (VASOTEC) 5 MG tablet and metoprolol succinate (TOPROL-XL) 100 MG 24 hr tablet        2. Which pharmacy/location (including street and city if local pharmacy) is medication to be sent to? [place], [place]  3. Do they need a 30 day or 90 day supply? 90 day supply    Rx was [facility] to [facility] when it should of went to [facility] mail order.

## 2018-06-03 ENCOUNTER — Ambulatory Visit: Payer: Self-pay | Admitting: General Surgery

## 2018-06-04 ENCOUNTER — Other Ambulatory Visit: Payer: Self-pay | Admitting: Interventional Cardiology

## 2018-06-04 DIAGNOSIS — I739 Peripheral vascular disease, unspecified: Secondary | ICD-10-CM

## 2018-06-07 ENCOUNTER — Ambulatory Visit
Admission: RE | Admit: 2018-06-07 | Discharge: 2018-06-07 | Disposition: A | Discharge status: Home / Self Care | Payer: Medicare Other | Source: Ambulatory Visit | Attending: General Surgery | Admitting: General Surgery

## 2018-06-07 ENCOUNTER — Ambulatory Visit
Admission: RE | Admit: 2018-06-07 | Discharge: 2018-06-07 | Disposition: A | Discharge status: Home / Self Care | Payer: Medicare Other | Source: Ambulatory Visit | Attending: Internal Medicine | Admitting: Internal Medicine

## 2018-06-07 DIAGNOSIS — E2839 Other primary ovarian failure: Secondary | ICD-10-CM | POA: Insufficient documentation

## 2018-06-07 DIAGNOSIS — Z1231 Encounter for screening mammogram for malignant neoplasm of breast: Secondary | ICD-10-CM | POA: Diagnosis not present

## 2018-06-07 DIAGNOSIS — M85832 Other specified disorders of bone density and structure, left forearm: Secondary | ICD-10-CM | POA: Diagnosis not present

## 2018-06-07 DIAGNOSIS — Z780 Asymptomatic menopausal state: Secondary | ICD-10-CM | POA: Diagnosis not present

## 2018-06-09 ENCOUNTER — Ambulatory Visit (HOSPITAL_COMMUNITY)
Admission: RE | Admit: 2018-06-09 | Discharge: 2018-06-09 | Disposition: A | Discharge status: Home / Self Care | Payer: Medicare Other | Source: Ambulatory Visit | Attending: Cardiovascular Disease | Admitting: Cardiovascular Disease

## 2018-06-09 ENCOUNTER — Ambulatory Visit (HOSPITAL_BASED_OUTPATIENT_CLINIC_OR_DEPARTMENT_OTHER)
Admission: RE | Admit: 2018-06-09 | Discharge: 2018-06-09 | Disposition: A | Discharge status: Home / Self Care | Payer: Medicare Other | Source: Ambulatory Visit | Attending: Interventional Cardiology | Admitting: Interventional Cardiology

## 2018-06-09 DIAGNOSIS — R0989 Other specified symptoms and signs involving the circulatory and respiratory systems: Secondary | ICD-10-CM | POA: Diagnosis not present

## 2018-06-09 DIAGNOSIS — I739 Peripheral vascular disease, unspecified: Secondary | ICD-10-CM | POA: Diagnosis not present

## 2018-06-10 ENCOUNTER — Encounter: Payer: Self-pay | Admitting: General Surgery

## 2018-06-10 ENCOUNTER — Ambulatory Visit: Payer: Medicare Other | Admitting: General Surgery

## 2018-06-10 ENCOUNTER — Telehealth: Payer: Self-pay

## 2018-06-10 ENCOUNTER — Ambulatory Visit: Payer: Self-pay | Admitting: General Surgery

## 2018-06-10 ENCOUNTER — Other Ambulatory Visit: Payer: Self-pay

## 2018-06-10 VITALS — BP 78/62 | HR 64 | Temp 97.5°F | Resp 18 | Ht 62.0 in | Wt 148.4 lb

## 2018-06-10 DIAGNOSIS — R0989 Other specified symptoms and signs involving the circulatory and respiratory systems: Secondary | ICD-10-CM

## 2018-06-10 DIAGNOSIS — Z1231 Encounter for screening mammogram for malignant neoplasm of breast: Secondary | ICD-10-CM | POA: Diagnosis not present

## 2018-06-10 DIAGNOSIS — I739 Peripheral vascular disease, unspecified: Secondary | ICD-10-CM

## 2018-06-10 NOTE — Telephone Encounter (Signed)
Called and made patient aware of results. Patient verbalized understanding. Patient wishes to see PV MD in [place]. Referral placed and patient scheduled for 06/15/18 with Dr. Fletcher Anon at 11:00 AM.

## 2018-06-10 NOTE — Telephone Encounter (Signed)
-----   Message from Jettie Booze, MD sent at 06/09/2018  5:02 PM EST ----- Significant leg disease bilaterally.  To be addressed at the [facility] consult.

## 2018-06-10 NOTE — Patient Instructions (Addendum)
The patient is aware to call back for any questions or concerns.  Patient will be asked to return to her PCP, Dr McLean-Scocuzza for annual bilateral screening mammogram.

## 2018-06-10 NOTE — Telephone Encounter (Signed)
-----   Message from Jettie Booze, MD sent at 06/09/2018  4:50 PM EST ----- Given upper extremity disease, unsure of accuracy of the ABIs.  THis will be addressed at her PV consult.

## 2018-06-10 NOTE — Telephone Encounter (Signed)
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  Would refer to Dr. Gwenlyn Found, Dr. Saunders Revel or Fletcher Anon, depending on where she wants to be seen.  I believe her daughter lives in [place] so I am not sure if they would prefer an appt in [place].

## 2018-06-10 NOTE — Progress Notes (Signed)
Patient ID: [name], female   DOB: [birth year], 78 y.o.   MRN: [id number]  Chief Complaint  Patient presents with  . Follow-up    1 year screening mammogram 06/07/2018    HPI [name] is a 78 y.o. female.  Here today for one year follow screening mammogram. Denies any problems at this time.  HPI  Past Medical History:  Diagnosis Date  . Arthritis    hands, knees, ankles  . BCC (basal cell carcinoma of skin) 2016   [place]  . Bilateral carotid artery stenosis    a. s/p right carotid stent after restenosis post CEA, left carotid stent (previously on Plavix for these). b. Carotid US 03/2016: stable 40-59% BICA.  Marland Kitchen Fibrocystic breast disease    followed by surgery Jamal Collin)  . GERD (gastroesophageal reflux disease)    OTC prilosec  . Hernia 2005  . History of chicken pox   . History of colon polyps   . History of melanoma 1990s   right shoulder  . HLD (hyperlipidemia)   . HTN (hypertension) 1970s  . Hypertensive retinopathy of both eyes, grade 1 2017   [place]  . Kidney cyst, acquired 2016   large R septated 8cm, stable 2017  . Melanoma in situ ([place]) 2017   R medial posterior arm - lentio maligna type, margin involved ([place])  . PAF (paroxysmal atrial fibrillation) ([place])    a. dx 12/2015 at routine physical.    Past Surgical History:  Procedure Laterality Date  . ABI  2007   R 1.19, L 1.2  . APPENDECTOMY  1995  . BREAST BIOPSY Left 1997   left  . BREAST CYST ASPIRATION Left 02/1997   fna  . BREAST EXCISIONAL BIOPSY Right 1998   benign per pt  . CARDIOVASCULAR STRESS TEST  ~2003   WNL  . CAROTID ENDARTERECTOMY  2002   bilateral  . CAROTID STENT Right 2002   right after restenosis post CEA (Dr. Edd Arbour)  . CAROTID STENT  2014   for stenosis  . CAROTID STENT Left 2014   Dr. Ardeth Sportsman, [place]  . CESAREAN SECTION  1968; 1972  . COLONOSCOPY  03/2012   Sankar - polyp (rec rpt 5 yrs)  . EXPLORATORY LAPAROTOMY  1990s   for GI pain, inflamed lower GI,  unrevealing workup, improved with abx  . HERNIA REPAIR  2005   abdomen  . MELANOMA EXCISION  1998   right shoulder  . TOTAL HIP ARTHROPLASTY  2005   bilat    Family History  Problem Relation Age of Onset  . Cancer Mother 37       lung, smoker  . Cancer Father 32       prostate  . Hypertension Paternal Grandfather   . CAD Neg Hx   . Stroke Neg Hx   . Diabetes Neg Hx   . Heart attack Neg Hx   . Breast cancer Neg Hx     Social History Social History   Tobacco Use  . Smoking status: Former Smoker    Packs/day: 1.00    Years: 30.00    Pack years: 30.00  . Smokeless tobacco: Never Used  Substance Use Topics  . Alcohol use: Yes    Alcohol/week: 0.0 standard drinks    Comment: 1 glass wine daily  . Drug use: No    No Known Allergies  Current Outpatient Medications  Medication Sig Dispense Refill  . acetaminophen (TYLENOL) 500 MG tablet Take 500 mg by mouth  every 6 (six) hours as needed.    Marland Kitchen apixaban (ELIQUIS) 5 MG TABS tablet Take 1 tablet (5 mg total) by mouth 2 (two) times daily. 180 tablet 1  . atorvastatin (LIPITOR) 40 MG tablet Take 1 tablet (40 mg total) by mouth at bedtime. 90 tablet 3  . calcium-vitamin D (OSCAL WITH D) 500-200 MG-UNIT per tablet Take 1 tablet by mouth daily.    Marland Kitchen diltiazem (CARDIZEM CD) 120 MG 24 hr capsule Take 1 capsule (120 mg total) by mouth daily. 90 capsule 3  . enalapril (VASOTEC) 5 MG tablet Take 1 tablet (5 mg total) by mouth daily. 90 tablet 3  . fluticasone (FLONASE) 50 MCG/ACT nasal spray Place 2 sprays into both nostrils daily as needed for allergies or rhinitis.    Marland Kitchen FLUZONE HIGH-DOSE 0.5 ML SUSY TO BE ADMINISTERED BY PHARMACIST FOR IMMUNIZATION  0  . metoprolol succinate (TOPROL-XL) 100 MG 24 hr tablet Take 1 tablet (100 mg total) by mouth daily. Take with or immediately following a meal. 90 tablet 3  . Multiple Vitamin (MULTIVITAMIN) capsule Take 1 capsule by mouth daily.    Marland Kitchen triamcinolone cream (KENALOG) 0.1 % Apply 1  application topically daily as needed (skin cancer).     No current facility-administered medications for this visit.     Review of Systems Review of Systems  Blood pressure (!) 78/62, pulse 64, temperature (!) 97.5 F (36.4 C), temperature source Temporal, resp. rate 18, height 5\' 2"  (1.575 m), weight 148 lb 6.4 oz (67.3 kg), SpO2 96 %.  Physical Exam Physical Exam Vitals signs reviewed. Exam conducted with a chaperone present.  Constitutional:      Appearance: Normal appearance.  Neck:     Musculoskeletal: Normal range of motion and neck supple.  Cardiovascular:     Rate and Rhythm: Normal rate and regular rhythm.  Pulmonary:     Effort: Pulmonary effort is normal.     Breath sounds: Normal breath sounds.  Lymphadenopathy:     Cervical: No cervical adenopathy.     Upper Body:     Right upper body: No axillary adenopathy.     Left upper body: No axillary adenopathy.  Skin:      Neurological:     Mental Status: She is alert.     Data Reviewed Bilateral screening mammograms dated June 07, 2018 reviewed.  No interval change.  BI-RADS-1.  Bone density results of the same date reviewed with the patient.  Spine normal, forearms osteopenia.  Assessment    Benign breast exam.    Plan    The patient has had stable mammograms for many years and has an unremarkable clinical exam.  I think at this time we are both comfortable with her having her annual screening mammogram scheduled by her PCP and ideally with a clinical exam during her annual visit.  The patient is encouraged to return at any time if she has questions or concerns or if any mammographic abnormalities are appreciated.       Forest Gleason Bernardo Brayman 06/10/2018, 10:22 AM

## 2018-06-15 ENCOUNTER — Encounter: Payer: Self-pay | Admitting: Cardiovascular Disease

## 2018-06-15 ENCOUNTER — Ambulatory Visit: Payer: Medicare Other | Admitting: Cardiovascular Disease

## 2018-06-15 VITALS — HR 73 | Ht 62.0 in | Wt 147.8 lb

## 2018-06-15 DIAGNOSIS — I739 Peripheral vascular disease, unspecified: Secondary | ICD-10-CM | POA: Diagnosis not present

## 2018-06-15 DIAGNOSIS — I771 Stricture of artery: Secondary | ICD-10-CM | POA: Diagnosis not present

## 2018-06-15 DIAGNOSIS — E785 Hyperlipidemia, unspecified: Secondary | ICD-10-CM

## 2018-06-15 DIAGNOSIS — I779 Disorder of arteries and arterioles, unspecified: Secondary | ICD-10-CM

## 2018-06-15 DIAGNOSIS — I482 Chronic atrial fibrillation, unspecified: Secondary | ICD-10-CM

## 2018-06-15 NOTE — Progress Notes (Addendum)
Cardiology Office Note   Date:  06/15/2018   ID:  [name], DOB [birth year], MRN [id number]  PCP:  McLean-Scocuzza, Rebecca Glow, MD  Cardiologist: [name]  Chief Complaint  Patient presents with  . Follow-up      History of Present Illness: [name] is a 78 y.o. female who was referred by [name] for evaluation and management of peripheral arterial disease. She has known history of atrial fibrillation, hyperlipidemia, hypertension and carotid disease.  She is not a smoker and has no family history of coronary artery disease.  She is not diabetic. She is status post bilateral carotid artery stenting in 2014. She started having bilateral calf pain with walking about 1 month ago which is equal in both sides.  This happens after walking about 50 to 100 feet.  In addition, she noticed bilateral arm discomfort with exertion worse on the left side than the right side.  She usually goes to the gym for exercise but she has been limited by the symptoms. She had carotid Doppler done in November which showed less than 50% stenosis bilaterally.  There was antegrade flow in both vertebral arteries and no significant disease in the subclavian arteries. She had noninvasive vascular evaluation done which showed an ABI of 0.88 on the right and 0.91 on the left but difficult to interpret this given the anterior pressure in the arms was abnormal.  Duplex showed significant right common femoral artery disease with moderate SFA disease and occluded popliteal artery.  On the left, there was severe disease affecting the left distal SFA.  Duplex of the upper extremities showed severe subclavian disease bilaterally worse on the left side.  Past Medical History:  Diagnosis Date  . Arthritis    hands, knees, ankles  . BCC (basal cell carcinoma of skin) 2016   Stinehelfer  . Bilateral carotid artery stenosis    a. s/p right carotid stent after restenosis post CEA, left carotid stent  (previously on Plavix for these). b. Carotid US 03/2016: stable 40-59% BICA.  [name] Fibrocystic breast disease    followed by surgery [name])  . GERD (gastroesophageal reflux disease)    OTC prilosec  . Hernia 2005  . History of chicken pox   . History of colon polyps   . History of melanoma 1990s   right shoulder  . HLD (hyperlipidemia)   . HTN (hypertension) 1970s  . Hypertensive retinopathy of both eyes, grade 1 2017   Bulakowski  . Kidney cyst, acquired 2016   large R septated 8cm, stable 2017  . Melanoma in situ ([place]) 2017   R medial posterior arm - lentio maligna type, margin involved ([name])  . PAF (paroxysmal atrial fibrillation) ([place])    a. dx 12/2015 at routine physical.    Past Surgical History:  Procedure Laterality Date  . ABI  2007   R 1.19, L 1.2  . APPENDECTOMY  1995  . BREAST BIOPSY Left 1997   left  . BREAST CYST ASPIRATION Left 02/1997   fna  . BREAST EXCISIONAL BIOPSY Right 1998   benign per pt  . CARDIOVASCULAR STRESS TEST  ~2003   WNL  . CAROTID ENDARTERECTOMY  2002   bilateral  . CAROTID STENT Right 2002   right after restenosis post CEA (Dr. Edd Arbour)  . CAROTID STENT  2014   for stenosis  . CAROTID STENT Left 2014   Dr. Ardeth Sportsman, [place]  . CESAREAN SECTION  1968; 1972  . COLONOSCOPY  03/2012   [name] - polyp (rec rpt 5 yrs)  . EXPLORATORY LAPAROTOMY  1990s   for GI pain, inflamed lower GI, unrevealing workup, improved with abx  . HERNIA REPAIR  2005   abdomen  . MELANOMA EXCISION  1998   right shoulder  . TOTAL HIP ARTHROPLASTY  2005   bilat     Current Outpatient Medications  Medication Sig Dispense Refill  . acetaminophen (TYLENOL) 500 MG tablet Take 500 mg by mouth daily.     [name] apixaban (ELIQUIS) 5 MG TABS tablet Take 1 tablet (5 mg total) by mouth 2 (two) times daily. 180 tablet 1  . atorvastatin (LIPITOR) 40 MG tablet Take 1 tablet (40 mg total) by mouth at bedtime. 90 tablet 3  . calcium-vitamin D (OSCAL WITH D) 500-200 MG-UNIT  per tablet Take 1 tablet by mouth daily.    [name] diltiazem (CARDIZEM CD) 120 MG 24 hr capsule Take 1 capsule (120 mg total) by mouth daily. 90 capsule 3  . enalapril (VASOTEC) 5 MG tablet Take 1 tablet (5 mg total) by mouth daily. 90 tablet 3  . fluticasone (FLONASE) 50 MCG/ACT nasal spray Place 2 sprays into both nostrils daily as needed for allergies or rhinitis.    . metoprolol succinate (TOPROL-XL) 100 MG 24 hr tablet Take 1 tablet (100 mg total) by mouth daily. Take with or immediately following a meal. 90 tablet 3  . triamcinolone cream (KENALOG) 0.1 % Apply 1 application topically daily as needed (skin cancer).    . Multiple Vitamin (MULTIVITAMIN WITH MINERALS) TABS tablet Take 1 tablet by mouth daily.     No current facility-administered medications for this visit.     Allergies:   Patient has no known allergies.    Social History:  The patient  reports that she has quit smoking. She has a 30.00 pack-year smoking history. She has never used smokeless tobacco. She reports current alcohol use. She reports that she does not use drugs.   Family History:  The patient's family history includes Cancer (age of onset: 64) in her father; Cancer (age of onset: 27) in her mother; Hypertension in her paternal grandfather.    ROS:  Please see the history of present illness.   Otherwise, review of systems are positive for none.   All other systems are reviewed and negative.    PHYSICAL EXAM: VS:  Pulse 73   Ht 5\' 2"  (1.575 m)   Wt 147 lb 12.8 oz (67 kg)   BMI 27.03 kg/m  , BMI Body mass index is 27.03 kg/m. GEN: Well nourished, well developed, in no acute distress  HEENT: normal  Neck: no JVD, carotid bruits, or masses Cardiac: Irregularly irregular; no murmurs, rubs, or gallops,no edema  Respiratory:  clear to auscultation bilaterally, normal work of breathing GI: soft, nontender, nondistended, + BS MS: no deformity or atrophy  Skin: warm and dry, no rash Neuro:  Strength and sensation  are intact Psych: euthymic mood, full affect Vascular: Radial pulses +1 bilaterally.  Femoral pulses mildly diminished bilaterally.  Distal pulses are not palpable.   EKG:  EKG is not ordered today.    Recent Labs: 02/16/2018: ALT 16; ALT 16; BUN 18; BUN 18; Creat 1.12; Creat 1.12; Potassium 5.1; Potassium 5.1; Sodium 139; Sodium 139; TSH 1.88 04/23/2018: Hemoglobin 15.4; Platelets 193    Lipid Panel    Component Value Date/Time   CHOL 135 02/16/2018 0924   TRIG 109 02/16/2018 0924   TRIG 144 03/28/2011  HDL 40 (L) 02/16/2018 0924   CHOLHDL 3.4 02/16/2018 0924   VLDL 23.0 04/15/2017 0744   LDLCALC 76 02/16/2018 0924   LDLDIRECT 78 03/28/2011      Wt Readings from Last 3 Encounters:  06/15/18 147 lb 12.8 oz (67 kg)  06/10/18 148 lb 6.4 oz (67.3 kg)  05/24/18 149 lb (67.6 kg)       No flowsheet data found.    ASSESSMENT AND PLAN:  1.  Peripheral arterial disease: Severe bilateral calf claudication equal in both sides.  Sudden development of this as well as involvement of the upper extremities is concerning for possible systemic illness or vasculitis.  Given severity of her symptoms, I have recommended proceeding with abdominal aortogram with lower extremity runoff and possible endovascular intervention.  I discussed the procedure in details as well as risks and benefits. Hold Eliquis 2 days before the procedure.  2.  Severe bilateral subclavian stenosis with significant arm claudication worse on the left side, I will plan on performing the aortic arch angiogram at the same time.  3.  Carotid disease: Recent carotid Doppler showed less than 50% stenosis bilaterally.  4.  Atrial fibrillation: Ventricular rate is controlled and she is tolerating anticoagulation.  5.  Hyperlipidemia: Continue treatment with atorvastatin with a target LDL of less than 70.    Disposition:   FU with me in 1 month  Signed,  Kathlyn Sacramento, MD  06/15/2018 6:09 PM    [place]

## 2018-06-15 NOTE — H&P (View-Only) (Signed)
Cardiology Office Note   Date:  06/15/2018   ID:  [name], DOB 03-20-1941, MRN [id number]  PCP:  McLean-Scocuzza, Nino Glow, MD  Cardiologist: Dr. Irish Lack  Chief Complaint  Patient presents with  . Follow-up      History of Present Illness: [name] is a 78 y.o. female who was referred by Dr. Irish Lack for evaluation and management of peripheral arterial disease. She has known history of atrial fibrillation, hyperlipidemia, hypertension and carotid disease.  She is not a smoker and has no family history of coronary artery disease.  She is not diabetic. She is status post bilateral carotid artery stenting in 2014. She started having bilateral calf pain with walking about 1 month ago which is equal in both sides.  This happens after walking about 50 to 100 feet.  In addition, she noticed bilateral arm discomfort with exertion worse on the left side than the right side.  She usually goes to the gym for exercise but she has been limited by the symptoms. She had carotid Doppler done in November which showed less than 50% stenosis bilaterally.  There was antegrade flow in both vertebral arteries and no significant disease in the subclavian arteries. She had noninvasive vascular evaluation done which showed an ABI of 0.88 on the right and 0.91 on the left but difficult to interpret this given the anterior pressure in the arms was abnormal.  Duplex showed significant right common femoral artery disease with moderate SFA disease and occluded popliteal artery.  On the left, there was severe disease affecting the left distal SFA.  Duplex of the upper extremities showed severe subclavian disease bilaterally worse on the left side.  Past Medical History:  Diagnosis Date  . Arthritis    hands, knees, ankles  . BCC (basal cell carcinoma of skin) 2016   Stinehelfer  . Bilateral carotid artery stenosis    a. s/p right carotid stent after restenosis post CEA, left carotid stent  (previously on Plavix for these). b. Carotid US 03/2016: stable 40-59% BICA.  Marland Kitchen Fibrocystic breast disease    followed by surgery Jamal Collin)  . GERD (gastroesophageal reflux disease)    OTC prilosec  . Hernia 2005  . History of chicken pox   . History of colon polyps   . History of melanoma 1990s   right shoulder  . HLD (hyperlipidemia)   . HTN (hypertension) 1970s  . Hypertensive retinopathy of both eyes, grade 1 2017   Bulakowski  . Kidney cyst, acquired 2016   large R septated 8cm, stable 2017  . 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 PAF (paroxysmal atrial fibrillation) ([place])    a. dx 12/2015 at routine physical.    Past Surgical History:  Procedure Laterality Date  . ABI  2007   R 1.19, L 1.2  . APPENDECTOMY  1995  . BREAST BIOPSY Left 1997   left  . BREAST CYST ASPIRATION Left 02/1997   fna  . BREAST EXCISIONAL BIOPSY Right 1998   benign per pt  . CARDIOVASCULAR STRESS TEST  ~2003   WNL  . CAROTID ENDARTERECTOMY  2002   bilateral  . CAROTID STENT Right 2002   right after restenosis post CEA (Dr. Edd Arbour)  . CAROTID STENT  2014   for stenosis  . CAROTID STENT Left 2014   Dr. Ardeth Sportsman, [place]  . CESAREAN SECTION  1968; 1972  . COLONOSCOPY  03/2012   Sankar - polyp (rec rpt 5 yrs)  . EXPLORATORY LAPAROTOMY  1990s   for GI pain, inflamed lower GI, unrevealing workup, improved with abx  . HERNIA REPAIR  2005   abdomen  . MELANOMA EXCISION  1998   right shoulder  . TOTAL HIP ARTHROPLASTY  2005   bilat     Current Outpatient Medications  Medication Sig Dispense Refill  . acetaminophen (TYLENOL) 500 MG tablet Take 500 mg by mouth daily.     Marland Kitchen apixaban (ELIQUIS) 5 MG TABS tablet Take 1 tablet (5 mg total) by mouth 2 (two) times daily. 180 tablet 1  . atorvastatin (LIPITOR) 40 MG tablet Take 1 tablet (40 mg total) by mouth at bedtime. 90 tablet 3  . calcium-vitamin D (OSCAL WITH D) 500-200 MG-UNIT  per tablet Take 1 tablet by mouth daily.    Marland Kitchen diltiazem (CARDIZEM CD) 120 MG 24 hr capsule Take 1 capsule (120 mg total) by mouth daily. 90 capsule 3  . enalapril (VASOTEC) 5 MG tablet Take 1 tablet (5 mg total) by mouth daily. 90 tablet 3  . fluticasone (FLONASE) 50 MCG/ACT nasal spray Place 2 sprays into both nostrils daily as needed for allergies or rhinitis.    . metoprolol succinate (TOPROL-XL) 100 MG 24 hr tablet Take 1 tablet (100 mg total) by mouth daily. Take with or immediately following a meal. 90 tablet 3  . triamcinolone cream (KENALOG) 0.1 % Apply 1 application topically daily as needed (skin cancer).    . Multiple Vitamin (MULTIVITAMIN WITH MINERALS) TABS tablet Take 1 tablet by mouth daily.     No current facility-administered medications for this visit.     Allergies:   Patient has no known allergies.    Social History:  The patient  reports that she has quit smoking. She has a 30.00 pack-year smoking history. She has never used smokeless tobacco. She reports current alcohol use. She reports that she does not use drugs.   Family History:  The patient's family history includes Cancer (age of onset: 43) in her father; Cancer (age of onset: 47) in her mother; Hypertension in her paternal grandfather.    ROS:  Please see the history of present illness.   Otherwise, review of systems are positive for none.   All other systems are reviewed and negative.    PHYSICAL EXAM: VS:  Pulse 73   Ht 5\' 2"  (1.575 m)   Wt 147 lb 12.8 oz (67 kg)   BMI 27.03 kg/m  , BMI Body mass index is 27.03 kg/m. GEN: Well nourished, well developed, in no acute distress  HEENT: normal  Neck: no JVD, carotid bruits, or masses Cardiac: Irregularly irregular; no murmurs, rubs, or gallops,no edema  Respiratory:  clear to auscultation bilaterally, normal work of breathing GI: soft, nontender, nondistended, + BS MS: no deformity or atrophy  Skin: warm and dry, no rash Neuro:  Strength and sensation  are intact Psych: euthymic mood, full affect Vascular: Radial pulses +1 bilaterally.  Femoral pulses mildly diminished bilaterally.  Distal pulses are not palpable.   EKG:  EKG is not ordered today.    Recent Labs: 02/16/2018: ALT 16; ALT 16; BUN 18; BUN 18; Creat 1.12; Creat 1.12; Potassium 5.1; Potassium 5.1; Sodium 139; Sodium 139; TSH 1.88 04/23/2018: Hemoglobin 15.4; Platelets 193    Lipid Panel    Component Value Date/Time   CHOL 135 02/16/2018 0924   TRIG 109 02/16/2018 0924   TRIG 144 03/28/2011  HDL 40 (L) 02/16/2018 0924   CHOLHDL 3.4 02/16/2018 0924   VLDL 23.0 04/15/2017 0744   LDLCALC 76 02/16/2018 0924   LDLDIRECT 78 03/28/2011      Wt Readings from Last 3 Encounters:  06/15/18 147 lb 12.8 oz (67 kg)  06/10/18 148 lb 6.4 oz (67.3 kg)  05/24/18 149 lb (67.6 kg)       No flowsheet data found.    ASSESSMENT AND PLAN:  1.  Peripheral arterial disease: Severe bilateral calf claudication equal in both sides.  Sudden development of this as well as involvement of the upper extremities is concerning for possible systemic illness or vasculitis.  Given severity of her symptoms, I have recommended proceeding with abdominal aortogram with lower extremity runoff and possible endovascular intervention.  I discussed the procedure in details as well as risks and benefits. Hold Eliquis 2 days before the procedure.  2.  Severe bilateral subclavian stenosis with significant arm claudication worse on the left side, I will plan on performing the aortic arch angiogram at the same time.  3.  Carotid disease: Recent carotid Doppler showed less than 50% stenosis bilaterally.  4.  Atrial fibrillation: Ventricular rate is controlled and she is tolerating anticoagulation.  5.  Hyperlipidemia: Continue treatment with atorvastatin with a target LDL of less than 70.    Disposition:   FU with me in 1 month  Signed,  Kathlyn Sacramento, MD  06/15/2018 6:09 PM    [place]

## 2018-06-15 NOTE — Patient Instructions (Signed)
Medication Instructions:  No changes If you need a refill on your cardiac medications before your next appointment, please call your pharmacy.   Lab work: Your provider would like for you to have the following labs today: CBC and BMET  If you have labs (blood work) drawn today and your tests are completely normal, you will receive your results only by: Marland Kitchen MyChart Message (if you have MyChart) OR . A paper copy in the mail If you have any lab test that is abnormal or we need to change your treatment, we will call you to review the results.  Follow-Up: At [facility], you and your health needs are our priority.  As part of our continuing mission to provide you with exceptional heart care, we have created designated Provider Care Teams.  These Care Teams include your primary Cardiologist (physician) and Advanced Practice Providers (APPs -  Physician Assistants and Nurse Practitioners) who all work together to provide you with the care you need, when you need it. You will need a follow up appointment in 1 month. You may see Dr. Fletcher Anon or one of the following Advanced Practice Providers on your designated Care Team:   Kerin Ransom, PA-C Roby Lofts, [place] . Sande Rives, PA-C  Any Other Special Instructions Will Be Listed Below (If Applicable).    [place] [address] [place] [address] Dept: [phone] Loc: [place]  06/15/2018  You are scheduled for a Peripheral Angiogram on Wednesday, February 12 with Dr. Kathlyn Sacramento.  1. Please arrive at the [facility] (Main Entrance A) at [facility]: [address] [place], [place] [address] at 6:30 AM (This time is two hours before your procedure to ensure your preparation). Free valet parking service is available.   Special note: Every effort is made to have your procedure done on time. Please understand that  emergencies sometimes delay scheduled procedures.  2. Diet: Do not eat solid foods after midnight.  The patient may have clear liquids until 5am upon the day of the procedure.  3. Labs: You will need to have blood drawn today 06/15/2018. You do not need to be fasting.  4. Medication instructions in preparation for your procedure: Please hold Eliquis 2 days prior to the procedure.   On the morning of your procedure, take your Aspirin and any morning medicines NOT listed above.  You may use sips of water.  5. Plan for one night stay--bring personal belongings. 6. Bring a current list of your medications and current insurance cards. 7. You MUST have a responsible person to drive you home. 8. Someone MUST be with you the first 24 hours after you arrive home or your discharge will be delayed. 9. Please wear clothes that are easy to get on and off and wear slip-on shoes.  Thank you for allowing [place] to care for you!   -- [place] [facility]

## 2018-06-16 LAB — CBC
HEMOGLOBIN: 16.3 g/dL — AB (ref 11.1–15.9)
Hematocrit: 48.4 % — ABNORMAL HIGH (ref 34.0–46.6)
MCH: 32.3 pg (ref 26.6–33.0)
MCHC: 33.7 g/dL (ref 31.5–35.7)
MCV: 96 fL (ref 79–97)
Platelets: 211 10*3/uL (ref 150–450)
RBC: 5.05 x10E6/uL (ref 3.77–5.28)
RDW: 13.2 % (ref 11.7–15.4)
WBC: 6.1 10*3/uL (ref 3.4–10.8)

## 2018-06-16 LAB — BASIC METABOLIC PANEL
BUN/Creatinine Ratio: 19 (ref 12–28)
BUN: 20 mg/dL (ref 8–27)
CO2: 23 mmol/L (ref 20–29)
Calcium: 10.5 mg/dL — ABNORMAL HIGH (ref 8.7–10.3)
Chloride: 101 mmol/L (ref 96–106)
Creatinine, Ser: 1.04 mg/dL — ABNORMAL HIGH (ref 0.57–1.00)
GFR calc Af Amer: 59 mL/min/{1.73_m2} — ABNORMAL LOW (ref >59)
GFR calc non Af Amer: 51 mL/min/{1.73_m2} — ABNORMAL LOW (ref >59)
Glucose: 94 mg/dL (ref 65–99)
Potassium: 5.1 mmol/L (ref 3.5–5.2)
Sodium: 140 mmol/L (ref 134–144)

## 2018-06-22 ENCOUNTER — Telehealth: Payer: Self-pay | Admitting: *Deleted

## 2018-06-22 NOTE — Telephone Encounter (Signed)
Pt contacted pre-catheterization scheduled at [facility] for: Wednesday June 23, 2018 8:30 AM Verified arrival time and place: [place] Entrance A at: 6:30 AM  No solid food after midnight prior to cath, clear liquids until 5 AM day of procedure. Contrast allergy: no Verified no diabetes medications.  Hold: Eliquis-last dose 06/20/18 until post procedure. Enalapril-AM of procedure.-GFR 51  Except hold medications AM meds can be  taken pre-cath with sip of water including: ASA 81 mg  Confirmed patient has responsible person to drive home post procedure and observe 24 hours after arriving home: yes  I encouraged patient to increase water intake today to hydrate pre procedure.

## 2018-06-23 ENCOUNTER — Encounter (HOSPITAL_COMMUNITY)
Admission: RE | Disposition: A | Discharge status: Home / Self Care | Payer: Self-pay | Source: Home / Self Care | Attending: Cardiovascular Disease

## 2018-06-23 ENCOUNTER — Ambulatory Visit (HOSPITAL_COMMUNITY)
Admission: RE | Admit: 2018-06-23 | Discharge: 2018-06-23 | Disposition: A | Discharge status: Home / Self Care | Payer: Medicare Other | Attending: Cardiovascular Disease | Admitting: Cardiovascular Disease

## 2018-06-23 ENCOUNTER — Other Ambulatory Visit: Payer: Self-pay

## 2018-06-23 DIAGNOSIS — I480 Paroxysmal atrial fibrillation: Secondary | ICD-10-CM | POA: Diagnosis not present

## 2018-06-23 DIAGNOSIS — Z85820 Personal history of malignant melanoma of skin: Secondary | ICD-10-CM | POA: Insufficient documentation

## 2018-06-23 DIAGNOSIS — I6523 Occlusion and stenosis of bilateral carotid arteries: Secondary | ICD-10-CM | POA: Insufficient documentation

## 2018-06-23 DIAGNOSIS — I70213 Atherosclerosis of native arteries of extremities with intermittent claudication, bilateral legs: Secondary | ICD-10-CM | POA: Diagnosis not present

## 2018-06-23 DIAGNOSIS — K219 Gastro-esophageal reflux disease without esophagitis: Secondary | ICD-10-CM | POA: Insufficient documentation

## 2018-06-23 DIAGNOSIS — I771 Stricture of artery: Secondary | ICD-10-CM | POA: Diagnosis not present

## 2018-06-23 DIAGNOSIS — E785 Hyperlipidemia, unspecified: Secondary | ICD-10-CM | POA: Insufficient documentation

## 2018-06-23 DIAGNOSIS — I10 Essential (primary) hypertension: Secondary | ICD-10-CM | POA: Diagnosis not present

## 2018-06-23 DIAGNOSIS — Z87891 Personal history of nicotine dependence: Secondary | ICD-10-CM | POA: Diagnosis not present

## 2018-06-23 DIAGNOSIS — Z7901 Long term (current) use of anticoagulants: Secondary | ICD-10-CM | POA: Insufficient documentation

## 2018-06-23 DIAGNOSIS — Z79899 Other long term (current) drug therapy: Secondary | ICD-10-CM | POA: Insufficient documentation

## 2018-06-23 DIAGNOSIS — I739 Peripheral vascular disease, unspecified: Secondary | ICD-10-CM | POA: Insufficient documentation

## 2018-06-23 DIAGNOSIS — Z8249 Family history of ischemic heart disease and other diseases of the circulatory system: Secondary | ICD-10-CM | POA: Diagnosis not present

## 2018-06-23 DIAGNOSIS — M1990 Unspecified osteoarthritis, unspecified site: Secondary | ICD-10-CM | POA: Diagnosis not present

## 2018-06-23 HISTORY — PX: AORTIC ARCH ANGIOGRAPHY: CATH118224

## 2018-06-23 HISTORY — PX: ABDOMINAL AORTOGRAM W/LOWER EXTREMITY: CATH118223

## 2018-06-23 SURGERY — AORTIC ARCH ANGIOGRAPHY
Anesthesia: LOCAL

## 2018-06-23 MED ORDER — HEPARIN (PORCINE) IN NACL 1000-0.9 UT/500ML-% IV SOLN
INTRAVENOUS | Status: DC | PRN
  Administered 2018-06-23 (×2): 500 mL

## 2018-06-23 MED ORDER — SODIUM CHLORIDE 0.9 % WEIGHT BASED INFUSION
3.0000 mL/kg/h | INTRAVENOUS | Status: AC
  Administered 2018-06-23: 3 mL/kg/h via INTRAVENOUS

## 2018-06-23 MED ORDER — HEPARIN (PORCINE) IN NACL 1000-0.9 UT/500ML-% IV SOLN
INTRAVENOUS | Status: AC
  Filled 2018-06-23: qty 1000

## 2018-06-23 MED ORDER — SODIUM CHLORIDE 0.9% FLUSH: 3.0000 mL | Freq: Two times a day (BID) | INTRAVENOUS | Status: DC

## 2018-06-23 MED ORDER — MIDAZOLAM HCL 2 MG/2ML IJ SOLN
INTRAMUSCULAR | Status: DC | PRN
  Administered 2018-06-23: 1 mg via INTRAVENOUS

## 2018-06-23 MED ORDER — SODIUM CHLORIDE 0.9% FLUSH: 3.0000 mL | INTRAVENOUS | Status: DC | PRN

## 2018-06-23 MED ORDER — LABETALOL HCL 5 MG/ML IV SOLN
INTRAVENOUS | Status: AC
  Filled 2018-06-23: qty 4

## 2018-06-23 MED ORDER — FENTANYL CITRATE (PF) 100 MCG/2ML IJ SOLN
INTRAMUSCULAR | Status: AC
  Filled 2018-06-23: qty 2

## 2018-06-23 MED ORDER — ONDANSETRON HCL 4 MG/2ML IJ SOLN: 4.0000 mg | Freq: Four times a day (QID) | INTRAMUSCULAR | Status: DC | PRN

## 2018-06-23 MED ORDER — SODIUM CHLORIDE 0.9 % IV SOLN: 250.0000 mL | INTRAVENOUS | Status: DC | PRN

## 2018-06-23 MED ORDER — SODIUM CHLORIDE 0.9 % WEIGHT BASED INFUSION: 1.0000 mL/kg/h | INTRAVENOUS | Status: DC

## 2018-06-23 MED ORDER — ACETAMINOPHEN 325 MG PO TABS: 650.0000 mg | ORAL_TABLET | ORAL | Status: DC | PRN

## 2018-06-23 MED ORDER — MIDAZOLAM HCL 2 MG/2ML IJ SOLN
INTRAMUSCULAR | Status: AC
  Filled 2018-06-23: qty 2

## 2018-06-23 MED ORDER — LABETALOL HCL 5 MG/ML IV SOLN: 10.0000 mg | INTRAVENOUS | Status: DC | PRN

## 2018-06-23 MED ORDER — LIDOCAINE HCL (PF) 1 % IJ SOLN
INTRAMUSCULAR | Status: DC | PRN
  Administered 2018-06-23: 15 mL

## 2018-06-23 MED ORDER — LIDOCAINE HCL (PF) 1 % IJ SOLN
INTRAMUSCULAR | Status: AC
  Filled 2018-06-23: qty 30

## 2018-06-23 MED ORDER — FENTANYL CITRATE (PF) 100 MCG/2ML IJ SOLN
INTRAMUSCULAR | Status: DC | PRN
  Administered 2018-06-23: 25 ug via INTRAVENOUS

## 2018-06-23 MED ORDER — LABETALOL HCL 5 MG/ML IV SOLN
INTRAVENOUS | Status: DC | PRN
  Administered 2018-06-23: 10 mg via INTRAVENOUS

## 2018-06-23 MED ORDER — ASPIRIN 81 MG PO CHEW: 81.0000 mg | CHEWABLE_TABLET | ORAL | Status: DC

## 2018-06-23 MED ORDER — SODIUM CHLORIDE 0.9 % IV SOLN: INTRAVENOUS | Status: DC

## 2018-06-23 MED ORDER — IODIXANOL 320 MG/ML IV SOLN
INTRAVENOUS | Status: DC | PRN
  Administered 2018-06-23: 215 mL via INTRAVENOUS

## 2018-06-23 SURGICAL SUPPLY — 12 items
CATH ANGIO 5F PIGTAIL 100CM (CATHETERS) ×2 IMPLANT
CATH INFINITI JR4 5F (CATHETERS) ×2 IMPLANT
CATH OMNI FLUSH 5F 65CM (CATHETERS) ×2 IMPLANT
KIT MICROPUNCTURE NIT STIFF (SHEATH) ×2 IMPLANT
KIT PV (KITS) ×2 IMPLANT
SHEATH PINNACLE 5F 10CM (SHEATH) ×2 IMPLANT
SHEATH PROBE COVER 6X72 (BAG) ×2 IMPLANT
STOPCOCK MORSE 400PSI 3WAY (MISCELLANEOUS) ×2 IMPLANT
SYR MEDRAD MARK 7 150ML (SYRINGE) ×2 IMPLANT
TRANSDUCER W/STOPCOCK (MISCELLANEOUS) ×2 IMPLANT
TRAY PV CATH (CUSTOM PROCEDURE TRAY) ×2 IMPLANT
WIRE HI TORQ VERSACORE J 260CM (WIRE) ×2 IMPLANT

## 2018-06-23 NOTE — Interval H&P Note (Signed)
History and Physical Interval Note:  06/23/2018 8:31 AM  [name]  has presented today for surgery, with the diagnosis of pad  The various methods of treatment have been discussed with the patient and family. After consideration of risks, benefits and other options for treatment, the patient has consented to  Procedure(s): AORTIC ARCH ANGIOGRAPHY (N/A) ABDOMINAL AORTOGRAM W/LOWER EXTREMITY (N/A) as a surgical intervention .  The patient's history has been reviewed, patient examined, no change in status, stable for surgery.  I have reviewed the patient's chart and labs.  Questions were answered to the patient's satisfaction.     Kathlyn Sacramento

## 2018-06-23 NOTE — Progress Notes (Signed)
No bleeding or hematoma noted after ambulation 

## 2018-06-23 NOTE — Progress Notes (Signed)
Site area: Right groin a 5 french arterial sheath was removed by Griffin Dakin RN  Site Prior to Removal:  Level 0  Pressure Applied For 20 MINUTES    Bedrest Beginning at   Manual:   Yes.    Patient Status During Pull:  stable  Post Pull Groin Site:  Level 0  Post Pull Instructions Given:  Yes.    Post Pull Pulses Present:  Yes.    Dressing Applied:  Yes.    Comments:  VS remain stable

## 2018-06-23 NOTE — Discharge Instructions (Signed)
Resume Eliquis tomorrow as long as no bleeding from right groin.    Femoral Site Care This sheet gives you information about how to care for yourself after your procedure. Your health care provider may also give you more specific instructions. If you have problems or questions, contact your health care provider. What can I expect after the procedure? After the procedure, it is common to have:  Bruising that usually fades within 1-2 weeks.  Tenderness at the site. Follow these instructions at home: Wound care  Follow instructions from your health care provider about how to take care of your insertion site. Make sure you: ? Wash your hands with soap and water before you change your bandage (dressing). If soap and water are not available, use hand sanitizer. ? Change your dressing as told by your health care provider. ? Leave stitches (sutures), skin glue, or adhesive strips in place. These skin closures may need to stay in place for 2 weeks or longer. If adhesive strip edges start to loosen and curl up, you may trim the loose edges. Do not remove adhesive strips completely unless your health care provider tells you to do that.  Do not take baths, swim, or use a hot tub until your health care provider approves.  You may shower 24-48 hours after the procedure or as told by your health care provider. ? Gently wash the site with plain soap and water. ? Pat the area dry with a clean towel. ? Do not rub the site. This may cause bleeding.  Do not apply powder or lotion to the site. Keep the site clean and dry.  Check your femoral site every day for signs of infection. Check for: ? Redness, swelling, or pain. ? Fluid or blood. ? Warmth. ? Pus or a bad smell. Activity  For the first 2-3 days after your procedure, or as long as directed: ? Avoid climbing stairs as much as possible. ? Do not squat.  Do not lift anything that is heavier than 10 lb (4.5 kg), or the limit that you are told,  until your health care provider says that it is safe.  Rest as directed. ? Avoid sitting for a long time without moving. Get up to take short walks every 1-2 hours.  Do not drive for 24 hours if you were given a medicine to help you relax (sedative). General instructions  Take over-the-counter and prescription medicines only as told by your health care provider.  Keep all follow-up visits as told by your health care provider. This is important. Contact a health care provider if you have:  A fever or chills.  You have redness, swelling, or pain around your insertion site. Get help right away if:  The catheter insertion area swells very fast.  You pass out.  You suddenly start to sweat or your skin gets clammy.  The catheter insertion area is bleeding, and the bleeding does not stop when you hold steady pressure on the area.  The area near or just beyond the catheter insertion site becomes pale, cool, tingly, or numb. These symptoms may represent a serious problem that is an emergency. Do not wait to see if the symptoms will go away. Get medical help right away. Call your local emergency services (911 in the [place] Do not drive yourself to the hospital. Summary  After the procedure, it is common to have bruising that usually fades within 1-2 weeks.  Check your femoral site every day for signs of infection.  Do not lift anything that is heavier than 10 lb (4.5 kg), or the limit that you are told, until your health care provider says that it is safe. This information is not intended to replace advice given to you by your health care provider. Make sure you discuss any questions you have with your health care provider. Document Released: 12/30/2013 Document Revised: 05/11/2017 Document Reviewed: 05/11/2017 Elsevier Interactive Patient Education  2019 [employer].

## 2018-06-24 ENCOUNTER — Encounter (HOSPITAL_COMMUNITY): Payer: Self-pay | Admitting: Cardiovascular Disease

## 2018-06-28 ENCOUNTER — Ambulatory Visit: Payer: Self-pay | Admitting: General Surgery

## 2018-07-08 ENCOUNTER — Ambulatory Visit: Payer: Medicare Other | Admitting: Cardiology

## 2018-07-13 ENCOUNTER — Encounter (INDEPENDENT_AMBULATORY_CARE_PROVIDER_SITE_OTHER): Payer: Self-pay

## 2018-07-13 ENCOUNTER — Ambulatory Visit: Payer: Medicare Other | Admitting: Cardiovascular Disease

## 2018-07-13 VITALS — HR 76 | Ht 62.0 in | Wt 145.0 lb

## 2018-07-13 DIAGNOSIS — I771 Stricture of artery: Secondary | ICD-10-CM | POA: Diagnosis not present

## 2018-07-13 DIAGNOSIS — I4891 Unspecified atrial fibrillation: Secondary | ICD-10-CM

## 2018-07-13 DIAGNOSIS — I739 Peripheral vascular disease, unspecified: Secondary | ICD-10-CM

## 2018-07-13 DIAGNOSIS — I6523 Occlusion and stenosis of bilateral carotid arteries: Secondary | ICD-10-CM

## 2018-07-13 DIAGNOSIS — E785 Hyperlipidemia, unspecified: Secondary | ICD-10-CM | POA: Diagnosis not present

## 2018-07-13 NOTE — Progress Notes (Signed)
Cardiology Office Note   Date:  07/13/2018   ID:  [name], DOB 24-Aug-1940, MRN [id number]  PCP:  McLean-Scocuzza, Rebecca Glow, MD  Cardiologist: Dr. Irish Lack  Chief Complaint  Patient presents with  . Follow-up    post angio, pt stated she is feeling ok      History of Present Illness: [name] is a 78 y.o. female who is here today for follow-up visit regarding peripheral arterial disease.   She has known history of atrial fibrillation, hyperlipidemia, hypertension and carotid disease.  She is not a smoker and has no family history of coronary artery disease.  She is not diabetic. She is status post bilateral carotid artery stenting in 2014. She was seen recently for bilateral calf claudication as well as arm discomfort with exertion. She had carotid Doppler done in November which showed less than 50% stenosis bilaterally.  There was antegrade flow in both vertebral arteries and no significant disease in the subclavian arteries. She had noninvasive vascular evaluation done which showed an ABI of 0.88 on the right and 0.91 on the left but difficult to interpret this given that pressure in the arms was abnormal.  Duplex showed significant right common femoral artery disease with moderate SFA disease and occluded popliteal artery.  On the left, there was severe disease affecting the left distal SFA.  Duplex of the upper extremities showed severe subclavian disease bilaterally worse on the left side.  I proceeded with aortic arch and lower extremity angiography.  There was moderate right subclavian artery stenosis at the origin of the [place].  There was significant stenosis affecting the left proximal axillary artery.  In the right lower extremity, there was significant calcified common femoral artery disease extending into the ostium of the SFA with occluded popliteal artery and two-vessel runoff below the knee.  On the left, there was borderline significant common iliac artery  stenosis with diffuse moderate disease affecting the entire left SFA with severe focal stenosis distally and three-vessel runoff below the knee.  She reports stable symptoms overall.  Past Medical History:  Diagnosis Date  . Arthritis    hands, knees, ankles  . BCC (basal cell carcinoma of skin) 2016   Stinehelfer  . Bilateral carotid artery stenosis    a. s/p right carotid stent after restenosis post CEA, left carotid stent (previously on Plavix for these). b. Carotid US 03/2016: stable 40-59% BICA.  Marland Kitchen Fibrocystic breast disease    followed by surgery Jamal Collin)  . GERD (gastroesophageal reflux disease)    OTC prilosec  . Hernia 2005  . History of chicken pox   . History of colon polyps   . History of melanoma 1990s   right shoulder  . HLD (hyperlipidemia)   . HTN (hypertension) 1970s  . Hypertensive retinopathy of both eyes, grade 1 2017   Bulakowski  . Kidney cyst, acquired 2016   large R septated 8cm, stable 2017  . Melanoma in situ ([place]) 2017   R medial posterior arm - lentio maligna type, margin involved ([place])  . PAF (paroxysmal atrial fibrillation) ([place])    a. dx 12/2015 at routine physical.    Past Surgical History:  Procedure Laterality Date  . ABDOMINAL AORTOGRAM W/LOWER EXTREMITY N/A 06/23/2018   Procedure: ABDOMINAL AORTOGRAM W/LOWER EXTREMITY;  Surgeon: Wellington Hampshire, MD;  Location: [facility];  Service: Cardiovascular;  Laterality: N/A;  . ABI  2007   R 1.19, L 1.2  . AORTIC ARCH ANGIOGRAPHY N/A  06/23/2018   Procedure: AORTIC ARCH ANGIOGRAPHY;  Surgeon: Wellington Hampshire, MD;  Location: [facility];  Service: Cardiovascular;  Laterality: N/A;  . APPENDECTOMY  1995  . BREAST BIOPSY Left 1997   left  . BREAST CYST ASPIRATION Left 02/1997   fna  . BREAST EXCISIONAL BIOPSY Right 1998   benign per pt  . CARDIOVASCULAR STRESS TEST  ~2003   WNL  . CAROTID ENDARTERECTOMY  2002   bilateral  . CAROTID STENT Right 2002   right after  restenosis post CEA (Dr. Edd Arbour)  . CAROTID STENT  2014   for stenosis  . CAROTID STENT Left 2014   Dr. Ardeth Sportsman, [place]  . CESAREAN SECTION  1968; 1972  . COLONOSCOPY  03/2012   Sankar - polyp (rec rpt 5 yrs)  . EXPLORATORY LAPAROTOMY  1990s   for GI pain, inflamed lower GI, unrevealing workup, improved with abx  . HERNIA REPAIR  2005   abdomen  . MELANOMA EXCISION  1998   right shoulder  . TOTAL HIP ARTHROPLASTY  2005   bilat     Current Outpatient Medications  Medication Sig Dispense Refill  . acetaminophen (TYLENOL) 500 MG tablet Take 500 mg by mouth daily.     Marland Kitchen apixaban (ELIQUIS) 5 MG TABS tablet Take 1 tablet (5 mg total) by mouth 2 (two) times daily. 180 tablet 1  . atorvastatin (LIPITOR) 40 MG tablet Take 1 tablet (40 mg total) by mouth at bedtime. 90 tablet 3  . calcium-vitamin D (OSCAL WITH D) 500-200 MG-UNIT per tablet Take 1 tablet by mouth daily.    Marland Kitchen diltiazem (CARDIZEM CD) 120 MG 24 hr capsule Take 1 capsule (120 mg total) by mouth daily. 90 capsule 3  . enalapril (VASOTEC) 5 MG tablet Take 1 tablet (5 mg total) by mouth daily. 90 tablet 3  . fluticasone (FLONASE) 50 MCG/ACT nasal spray Place 2 sprays into both nostrils daily as needed for allergies or rhinitis.    . metoprolol succinate (TOPROL-XL) 100 MG 24 hr tablet Take 1 tablet (100 mg total) by mouth daily. Take with or immediately following a meal. 90 tablet 3  . Multiple Vitamin (MULTIVITAMIN WITH MINERALS) TABS tablet Take 1 tablet by mouth daily.    Marland Kitchen triamcinolone cream (KENALOG) 0.1 % Apply 1 application topically daily as needed (skin cancer).     No current facility-administered medications for this visit.     Allergies:   Patient has no known allergies.    Social History:  The patient  reports that she has quit smoking. She has a 30.00 pack-year smoking history. She has never used smokeless tobacco. She reports current alcohol use. She reports that she does not use drugs.   Family History:  The  patient's family history includes Cancer (age of onset: 66) in her father; Cancer (age of onset: 23) in her mother; Hypertension in her paternal grandfather.    ROS:  Please see the history of present illness.   Otherwise, review of systems are positive for none.   All other systems are reviewed and negative.    PHYSICAL EXAM: VS:  Pulse 76   Ht 5\' 2"  (1.575 m)   Wt 145 lb (65.8 kg)   SpO2 97%   BMI 26.52 kg/m  , BMI Body mass index is 26.52 kg/m. GEN: Well nourished, well developed, in no acute distress  HEENT: normal  Neck: no JVD, carotid bruits, or masses Cardiac: Irregularly irregular; no murmurs, rubs, or gallops,no edema  Respiratory:  clear to auscultation bilaterally, normal work of breathing GI: soft, nontender, nondistended, + BS MS: no deformity or atrophy  Skin: warm and dry, no rash Neuro:  Strength and sensation are intact Psych: euthymic mood, full affect Vascular: Radial pulses +1 bilaterally.  Femoral pulses mildly diminished bilaterally.  Distal pulses are not palpable.   EKG:  EKG is not ordered today.    Recent Labs: 02/16/2018: ALT 16; ALT 16; TSH 1.88 06/15/2018: BUN 20; Creatinine, Ser 1.04; Hemoglobin 16.3; Platelets 211; Potassium 5.1; Sodium 140    Lipid Panel    Component Value Date/Time   CHOL 135 02/16/2018 0924   TRIG 109 02/16/2018 0924   TRIG 144 03/28/2011   HDL 40 (L) 02/16/2018 0924   CHOLHDL 3.4 02/16/2018 0924   VLDL 23.0 04/15/2017 0744   LDLCALC 76 02/16/2018 0924   LDLDIRECT 78 03/28/2011      Wt Readings from Last 3 Encounters:  07/13/18 145 lb (65.8 kg)  06/23/18 135 lb (61.2 kg)  06/15/18 147 lb 12.8 oz (67 kg)       No flowsheet data found.    ASSESSMENT AND PLAN:  1.  Peripheral arterial disease:  bilateral calf claudication equal in both sides.   The patient has diffuse disease as outlined above.  I recommend attempting an exercise program before considering any further revascularization given that her  symptoms include claudication and not critical limb ischemia. The disease on the right lower extremity requires right carotid endarterectomy into the ostium of the SFA or atherectomy as a second choice.  On the left side, the whole SFA will need to be treated.  2.  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  Again, her symptoms are not severe enough to require revascularization at the present time.  3.  Carotid disease: Recent carotid Doppler showed less than 50% stenosis bilaterally.  4.  Atrial fibrillation: Ventricular rate is controlled and she is tolerating anticoagulation.  5.  Hyperlipidemia: Continue treatment with atorvastatin with a target LDL of less than 70.    Disposition:   FU with me in 4 months  Signed,  Kathlyn Sacramento, MD  07/13/2018 1:26 PM    [facility]

## 2018-07-13 NOTE — Patient Instructions (Signed)
Medication Instructions:  STOP- Aspirin  If you need a refill on your cardiac medications before your next appointment, please call your pharmacy.  Labwork: None Ordered   Testing/Procedures: None Ordered  Follow-Up: You will need a follow up appointment in 4 months.  Please call our office 2 months in advance to schedule this appointment.  You may see Dr Fletcher Anon or one of the following Advanced Practice Providers on your designated Care Team:   Kerin Ransom, PA-C [place], [place] . Sande Rives, PA-C     At [facility], you and your health needs are our priority.  As part of our continuing mission to provide you with exceptional heart care, we have created designated Provider Care Teams.  These Care Teams include your primary Cardiologist (physician) and Advanced Practice Providers (APPs -  Physician Assistants and Nurse Practitioners) who all work together to provide you with the care you need, when you need it.  Thank you for choosing [facility] at [facility]!!

## 2018-07-23 ENCOUNTER — Ambulatory Visit: Payer: Medicare Other | Admitting: Internal Medicine

## 2018-08-04 ENCOUNTER — Encounter: Payer: Self-pay | Admitting: Internal Medicine

## 2018-08-04 ENCOUNTER — Other Ambulatory Visit: Payer: Self-pay

## 2018-08-04 ENCOUNTER — Ambulatory Visit (INDEPENDENT_AMBULATORY_CARE_PROVIDER_SITE_OTHER): Payer: Medicare Other | Admitting: Internal Medicine

## 2018-08-04 ENCOUNTER — Other Ambulatory Visit: Payer: Self-pay | Admitting: Internal Medicine

## 2018-08-04 ENCOUNTER — Ambulatory Visit: Payer: Medicare Other | Admitting: Internal Medicine

## 2018-08-04 ENCOUNTER — Ambulatory Visit (INDEPENDENT_AMBULATORY_CARE_PROVIDER_SITE_OTHER): Payer: Medicare Other

## 2018-08-04 VITALS — BP 124/80 | HR 74 | Temp 98.4°F | Ht 62.0 in | Wt 144.6 lb

## 2018-08-04 DIAGNOSIS — M25532 Pain in left wrist: Secondary | ICD-10-CM

## 2018-08-04 DIAGNOSIS — M79632 Pain in left forearm: Secondary | ICD-10-CM

## 2018-08-04 DIAGNOSIS — M79642 Pain in left hand: Secondary | ICD-10-CM

## 2018-08-04 MED ORDER — OXYCODONE-ACETAMINOPHEN 5-325 MG PO TABS: 1.0000 | ORAL_TABLET | Freq: Three times a day (TID) | ORAL | 0 refills | Status: DC | PRN

## 2018-08-04 NOTE — Progress Notes (Signed)
Chief Complaint  Patient presents with  . Wrist Pain    left   Left wrist hand forearm pain since 07/31/18 when dog pulled and leash wrapped around her hand while dog was pulling pain >10/10 unable to sleep with limited ROM and holding up makes pain better tried Tylenol only 2 doses. Reduced ROM and pain with ROM radiating to forearm and swelling she also tried L wrist brace she had at home   Review of Systems  Constitutional: Negative for weight loss.  HENT: Negative for hearing loss.   Eyes: Negative for blurred vision.  Respiratory: Negative for shortness of breath.   Cardiovascular: Negative for chest pain.  Musculoskeletal: Positive for joint pain.  Skin: Negative for rash.  Neurological: Negative for headaches.  Psychiatric/Behavioral: Negative for depression.   Past Medical History:  Diagnosis Date  . Arthritis    hands, knees, ankles  . BCC (basal cell carcinoma of skin) 2016   Stinehelfer  . Bilateral carotid artery stenosis    a. s/p right carotid stent after restenosis post CEA, left carotid stent (previously on Plavix for these). b. Carotid US 03/2016: stable 40-59% BICA.  Marland Kitchen Fibrocystic breast disease    followed by surgery Jamal Collin)  . GERD (gastroesophageal reflux disease)    OTC prilosec  . Hernia 2005  . History of chicken pox   . History of colon polyps   . History of melanoma 1990s   right shoulder  . HLD (hyperlipidemia)   . HTN (hypertension) 1970s  . Hypertensive retinopathy of both eyes, grade 1 2017   Bulakowski  . Kidney cyst, acquired 2016   large R septated 8cm, stable 2017  . 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 PAF (paroxysmal atrial fibrillation) ([place])    a. dx 12/2015 at routine physical.   Past Surgical History:  Procedure Laterality Date  . ABDOMINAL AORTOGRAM W/LOWER EXTREMITY N/A 06/23/2018   Procedure: ABDOMINAL AORTOGRAM W/LOWER EXTREMITY;  Surgeon: Wellington Hampshire, MD;  Location: [facility];  Service: Cardiovascular;  Laterality: N/A;  . ABI  2007   R 1.19, L 1.2  . AORTIC ARCH ANGIOGRAPHY N/A 06/23/2018   Procedure: AORTIC ARCH ANGIOGRAPHY;  Surgeon: Wellington Hampshire, MD;  Location: [facility];  Service: Cardiovascular;  Laterality: N/A;  . APPENDECTOMY  1995  . BREAST BIOPSY Left 1997   left  . BREAST CYST ASPIRATION Left 02/1997   fna  . BREAST EXCISIONAL BIOPSY Right 1998   benign per pt  . CARDIOVASCULAR STRESS TEST  ~2003   WNL  . CAROTID ENDARTERECTOMY  2002   bilateral  . CAROTID STENT Right 2002   right after restenosis post CEA (Dr. Edd Arbour)  . CAROTID STENT  2014   for stenosis  . CAROTID STENT Left 2014   Dr. Ardeth Sportsman, [place]  . CESAREAN SECTION  1968; 1972  . COLONOSCOPY  03/2012   Sankar - polyp (rec rpt 5 yrs)  . EXPLORATORY LAPAROTOMY  1990s   for GI pain, inflamed lower GI, unrevealing workup, improved with abx  . HERNIA REPAIR  2005   abdomen  . MELANOMA EXCISION  1998   right shoulder  . TOTAL HIP ARTHROPLASTY  2005   bilat   Family History  Problem Relation Age of Onset  . Cancer Mother 66       lung, smoker  . Cancer Father 89  prostate  . Hypertension Paternal Grandfather   . CAD Neg Hx   . Stroke Neg Hx   . Diabetes Neg Hx   . Heart attack Neg Hx   . Breast cancer Neg Hx    Social History   Socioeconomic History  . Marital status: Widowed    Spouse name: Not on file  . Number of children: Not on file  . Years of education: Not on file  . Highest education level: Not on file  Occupational History  . Not on file  Social Needs  . Financial resource strain: Not on file  . Food insecurity:    Worry: Not on file    Inability: Not on file  . Transportation needs:    Medical: Not on file    Non-medical: Not on file  Tobacco Use  . Smoking status: Former Smoker    Packs/day: 1.00    Years: 30.00    Pack years: 30.00  . Smokeless tobacco: Never Used  Substance and Sexual  Activity  . Alcohol use: Yes    Alcohol/week: 0.0 standard drinks    Comment: 1 glass wine daily  . Drug use: No  . Sexual activity: Not on file  Lifestyle  . Physical activity:    Days per week: Not on file    Minutes per session: Not on file  . Stress: Not on file  Relationships  . Social connections:    Talks on phone: Not on file    Gets together: Not on file    Attends religious service: Not on file    Active member of club or organization: Not on file    Attends meetings of clubs or organizations: Not on file    Relationship status: Not on file  . Intimate partner violence:    Fear of current or ex partner: Not on file    Emotionally abused: Not on file    Physically abused: Not on file    Forced sexual activity: Not on file  Other Topics Concern  . Not on file  Social History Narrative   Caffeine: 3 cups coffee/day   Widow of husband Jori Moll), lives with 1 dog.  Grown children   Occupation: homemaker   Edu: college   Activity: plays golf, walks dog, swims   Diet: good water, fruits/vegetables daily   Current Meds  Medication Sig  . acetaminophen (TYLENOL) 500 MG tablet Take 500 mg by mouth daily.   Marland Kitchen apixaban (ELIQUIS) 5 MG TABS tablet Take 1 tablet (5 mg total) by mouth 2 (two) times daily.  Marland Kitchen atorvastatin (LIPITOR) 40 MG tablet Take 1 tablet (40 mg total) by mouth at bedtime.  . calcium-vitamin D (OSCAL WITH D) 500-200 MG-UNIT per tablet Take 1 tablet by mouth daily.  Marland Kitchen diltiazem (CARDIZEM CD) 120 MG 24 hr capsule Take 1 capsule (120 mg total) by mouth daily.  . enalapril (VASOTEC) 5 MG tablet Take 1 tablet (5 mg total) by mouth daily.  . fluticasone (FLONASE) 50 MCG/ACT nasal spray Place 2 sprays into both nostrils daily as needed for allergies or rhinitis.  . metoprolol succinate (TOPROL-XL) 100 MG 24 hr tablet Take 1 tablet (100 mg total) by mouth daily. Take with or immediately following a meal.  . Multiple Vitamin (MULTIVITAMIN WITH MINERALS) TABS tablet Take  1 tablet by mouth daily.  Marland Kitchen oxyCODONE-acetaminophen (PERCOCET) 5-325 MG tablet Take 1 tablet by mouth every 8 (eight) hours as needed for severe pain.  Marland Kitchen triamcinolone cream (KENALOG) 0.1 %  Apply 1 application topically daily as needed (skin cancer).   No Known Allergies Recent Results (from the past 2160 hour(s))  Basic metabolic panel     Status: Abnormal   Collection Time: 06/15/18 12:00 AM  Result Value Ref Range   Glucose 94 65 - 99 mg/dL   BUN 20 8 - 27 mg/dL   Creatinine, Ser 1.04 (H) 0.57 - 1.00 mg/dL   GFR calc non Af Amer 51 (L) >59 mL/min/1.73   GFR calc Af Amer 59 (L) >59 mL/min/1.73   BUN/Creatinine Ratio 19 12 - 28   Sodium 140 134 - 144 mmol/L   Potassium 5.1 3.5 - 5.2 mmol/L   Chloride 101 96 - 106 mmol/L   CO2 23 20 - 29 mmol/L   Calcium 10.5 (H) 8.7 - 10.3 mg/dL  CBC     Status: Abnormal   Collection Time: 06/15/18 12:00 AM  Result Value Ref Range   WBC 6.1 3.4 - 10.8 x10E3/uL   RBC 5.05 3.77 - 5.28 x10E6/uL   Hemoglobin 16.3 (H) 11.1 - 15.9 g/dL   Hematocrit 48.4 (H) 34.0 - 46.6 %   MCV 96 79 - 97 fL   MCH 32.3 26.6 - 33.0 pg   MCHC 33.7 31.5 - 35.7 g/dL   RDW 13.2 11.7 - 15.4 %   Platelets 211 150 - 450 x10E3/uL   Objective  Body mass index is 26.45 kg/m. Wt Readings from Last 3 Encounters:  08/04/18 144 lb 9.6 oz (65.6 kg)  07/13/18 145 lb (65.8 kg)  06/23/18 135 lb (61.2 kg)   Temp Readings from Last 3 Encounters:  08/04/18 98.4 F (36.9 C) (Oral)  06/23/18 98 F (36.7 C) (Oral)  06/10/18 (!) 97.5 F (36.4 C) (Temporal)   BP Readings from Last 3 Encounters:  08/04/18 124/80  06/23/18 103/65  06/10/18 (!) 78/62   Pulse Readings from Last 3 Encounters:  08/04/18 74  07/13/18 76  06/23/18 (!) 46    Physical Exam Vitals signs and nursing note reviewed.  Constitutional:      Appearance: Normal appearance. She is well-developed and well-groomed.  HENT:     Head: Normocephalic and atraumatic.     Nose: Nose normal.     Mouth/Throat:      Mouth: Mucous membranes are moist.     Pharynx: Oropharynx is clear.  Eyes:     Conjunctiva/sclera: Conjunctivae normal.     Pupils: Pupils are equal, round, and reactive to light.  Cardiovascular:     Rate and Rhythm: Normal rate and regular rhythm.     Heart sounds: Normal heart sounds.  Pulmonary:     Effort: Pulmonary effort is normal.     Breath sounds: Normal breath sounds.  Musculoskeletal:     Left wrist: She exhibits decreased range of motion, tenderness, bony tenderness and swelling.     Comments: Swelling and moderate to severe ttp ulnar and thumb side of wrist and left forearm    Skin:    General: Skin is warm and dry.  Neurological:     General: No focal deficit present.     Mental Status: She is alert and oriented to person, place, and time. Mental status is at baseline.     Gait: Gait normal.  Psychiatric:        Attention and Perception: Attention and perception normal.        Mood and Affect: Mood and affect normal.        Speech: Speech normal.  Behavior: Behavior normal. Behavior is cooperative.        Thought Content: Thought content normal.        Cognition and Memory: Cognition and memory normal.        Judgment: Judgment normal.     Assessment   1. Left hand, wrist and forearm pain w/o fall see HPI  Plan   1. Xray L hand, wrist and forearm today  If abnormal rec emerge ortho walk in clinic  Percocet 5-325 bid to tid prn monitor constipation   Provider: Dr. Olivia Mackie McLean-Scocuzza-Internal Medicine

## 2018-08-04 NOTE — Patient Instructions (Signed)
You can take percocet up to 2-3 x per day  [facility] if Xrays abnormal walk in hours 1 pm to 7 pm Monday-Friday  Wrist Pain, Adult There are many things that can cause wrist pain. Some common causes include:  An injury to the wrist area, such as a sprain, strain, or fracture.  Overuse of the joint.  A condition that causes increased pressure on a nerve in the wrist (carpal tunnel syndrome).  Wear and tear of the joints that occurs with aging (osteoarthritis).  A variety of other types of arthritis. Sometimes, the cause of wrist pain is not known. Often, the pain goes away when you follow instructions from your health care provider for relieving pain at home, such as resting or icing the wrist. If your wrist pain continues, it is important to tell your health care provider. Follow these instructions at home:  Rest the wrist area for at least 48 hours or as long as told by your health care provider.  If a splint or elastic bandage has been applied, use it as told by your health care provider. ? Remove the splint or bandage only as told by your health care provider. ? Loosen the splint or bandage if your fingers tingle, become numb, or turn cold or blue.  If directed, apply ice to the injured area. ? If you have a removable splint or elastic bandage, remove it as told by your health care provider. ? Put ice in a plastic bag. ? Place a towel between your skin and the bag or between your splint or bandage and the bag. ? Leave the ice on for 20 minutes, 2-3 times a day.   Keep your arm raised (elevated) above the level of your heart while you are sitting or lying down.  Take over-the-counter and prescription medicines only as told by your health care provider.  Keep all follow-up visits as told by your health care provider. This is important. Contact a health care provider if:  You have a sudden sharp pain in the wrist, hand, or arm that is different or new.   The swelling or bruising on your wrist or hand gets worse.  Your skin becomes red, gets a rash, or has open sores.  Your pain does not get better or it gets worse. Get help right away if:  You lose feeling in your fingers or hand.  Your fingers turn white, very red, or cold and blue.  You cannot move your fingers.  You have a fever or chills. This information is not intended to replace advice given to you by your health care provider. Make sure you discuss any questions you have with your health care provider. Document Released: 02/05/2005 Document Revised: 11/22/2015 Document Reviewed: 11/15/2015 Elsevier Interactive Patient Education  2019 [employer].

## 2018-08-04 NOTE — Progress Notes (Signed)
Pre visit review using our clinic review tool, if applicable. No additional management support is needed unless otherwise documented below in the visit note. 

## 2018-09-21 DIAGNOSIS — H5203 Hypermetropia, bilateral: Secondary | ICD-10-CM | POA: Diagnosis not present

## 2018-09-21 DIAGNOSIS — H2513 Age-related nuclear cataract, bilateral: Secondary | ICD-10-CM | POA: Diagnosis not present

## 2018-09-21 DIAGNOSIS — H524 Presbyopia: Secondary | ICD-10-CM | POA: Diagnosis not present

## 2018-09-21 DIAGNOSIS — H16223 Keratoconjunctivitis sicca, not specified as Sjogren's, bilateral: Secondary | ICD-10-CM | POA: Diagnosis not present

## 2018-09-21 DIAGNOSIS — H04123 Dry eye syndrome of bilateral lacrimal glands: Secondary | ICD-10-CM | POA: Diagnosis not present

## 2018-11-15 ENCOUNTER — Telehealth: Payer: Self-pay | Admitting: *Deleted

## 2018-11-15 NOTE — Telephone Encounter (Signed)

## 2018-11-16 ENCOUNTER — Telehealth (INDEPENDENT_AMBULATORY_CARE_PROVIDER_SITE_OTHER): Payer: Medicare Other | Admitting: Cardiovascular Disease

## 2018-11-16 DIAGNOSIS — I482 Chronic atrial fibrillation, unspecified: Secondary | ICD-10-CM

## 2018-11-16 DIAGNOSIS — I739 Peripheral vascular disease, unspecified: Secondary | ICD-10-CM

## 2018-11-16 DIAGNOSIS — I779 Disorder of arteries and arterioles, unspecified: Secondary | ICD-10-CM

## 2018-11-16 NOTE — Progress Notes (Signed)
Virtual Visit via Video Note   This visit type was conducted due to national recommendations for restrictions regarding the COVID-19 Pandemic (e.g. social distancing) in an effort to limit this patient's exposure and mitigate transmission in our community.  Due to her co-morbid illnesses, this patient is at least at moderate risk for complications without adequate follow up.  This format is felt to be most appropriate for this patient at this time.  All issues noted in this document were discussed and addressed.  A limited physical exam was performed with this format.  Please refer to the patient's chart for her consent to telehealth for [facility].   Date:  11/16/2018   ID:  [name], DOB 12-02-1940, MRN [id number]  Patient Location: Home Provider Location: Office  PCP:  McLean-Scocuzza, Nino Glow, MD  Cardiologist: Dr. Irish Lack Electrophysiologist:  None   Evaluation Performed:  Follow-Up Visit  Chief Complaint:  Leg claudication  History of Present Illness:    [name] is a 78 y.o. female who was seen via video visit for follow-up visit regarding peripheral arterial disease.   She has known history of atrial fibrillation, hyperlipidemia, hypertension and carotid disease.  She is not a smoker and has no family history of coronary artery disease.  She is not diabetic. She is status post bilateral carotid artery stenting in 2014. She was seen in 2019 for bilateral calf claudication as well as arm discomfort with exertion. She had carotid Doppler done in November which showed less than 50% stenosis bilaterally.  There was antegrade flow in both vertebral arteries and no significant disease in the subclavian arteries. She had noninvasive vascular evaluation done which showed an ABI of 0.88 on the right and 0.91 on the left but difficult to interpret this given that pressure in the arms was abnormal.  Duplex showed significant right common femoral artery disease with moderate  SFA disease and occluded popliteal artery.  On the left, there was severe disease affecting the left distal SFA.  Duplex of the upper extremities showed severe subclavian disease bilaterally worse on the left side.  Aortic arch and lower extremity angiography was performed in February 2020. There was moderate right subclavian artery stenosis at the origin of the [place].  There was significant stenosis affecting the left proximal axillary artery.  In the right lower extremity, there was significant calcified common femoral artery disease extending into the ostium of the SFA with occluded popliteal artery and two-vessel runoff below the knee.  On the left, there was borderline significant common iliac artery stenosis with diffuse moderate disease affecting the entire left SFA with severe focal stenosis distally and three-vessel runoff below the knee.  Given that the patient's symptoms were not lifestyle limiting, I advised her to start an exercise program before considering revascularization.  She started walking 30 minutes daily and noticed gradual improvement in symptoms.  She gets claudication in her legs after walking about 15 minutes.  She rests and then she can resume especially if she is not going uphill.  Arm discomfort is not very significant.  No chest pain or shortness of breath  The patient does not have symptoms concerning for COVID-19 infection (fever, chills, cough, or new shortness of breath).    Past Medical History:  Diagnosis Date  . Arthritis    hands, knees, ankles  . BCC (basal cell carcinoma of skin) 2016   Stinehelfer  . Bilateral carotid artery stenosis    a. s/p right carotid stent after  restenosis post CEA, left carotid stent (previously on Plavix for these). b. Carotid US 03/2016: stable 40-59% BICA.  Marland Kitchen Fibrocystic breast disease    followed by surgery Jamal Collin)  . GERD (gastroesophageal reflux disease)    OTC prilosec  . Hernia 2005  . History of chicken pox   .  History of colon polyps   . History of melanoma 1990s   right shoulder  . HLD (hyperlipidemia)   . HTN (hypertension) 1970s  . Hypertensive retinopathy of both eyes, grade 1 2017   Bulakowski  . Kidney cyst, acquired 2016   large R septated 8cm, stable 2017  . Melanoma in situ ([place]) 2017   R medial posterior arm - lentio maligna type, margin involved ([place])  . PAF (paroxysmal atrial fibrillation) ([place])    a. dx 12/2015 at routine physical.   Past Surgical History:  Procedure Laterality Date  . ABDOMINAL AORTOGRAM W/LOWER EXTREMITY N/A 06/23/2018   Procedure: ABDOMINAL AORTOGRAM W/LOWER EXTREMITY;  Surgeon: Wellington Hampshire, MD;  Location: [facility];  Service: Cardiovascular;  Laterality: N/A;  . ABI  2007   R 1.19, L 1.2  . AORTIC ARCH ANGIOGRAPHY N/A 06/23/2018   Procedure: AORTIC ARCH ANGIOGRAPHY;  Surgeon: Wellington Hampshire, MD;  Location: [facility];  Service: Cardiovascular;  Laterality: N/A;  . APPENDECTOMY  1995  . BREAST BIOPSY Left 1997   left  . BREAST CYST ASPIRATION Left 02/1997   fna  . BREAST EXCISIONAL BIOPSY Right 1998   benign per pt  . CARDIOVASCULAR STRESS TEST  ~2003   WNL  . CAROTID ENDARTERECTOMY  2002   bilateral  . CAROTID STENT Right 2002   right after restenosis post CEA (Dr. Edd Arbour)  . CAROTID STENT  2014   for stenosis  . CAROTID STENT Left 2014   Dr. Ardeth Sportsman, [place]  . CESAREAN SECTION  1968; 1972  . COLONOSCOPY  03/2012   Sankar - polyp (rec rpt 5 yrs)  . EXPLORATORY LAPAROTOMY  1990s   for GI pain, inflamed lower GI, unrevealing workup, improved with abx  . HERNIA REPAIR  2005   abdomen  . MELANOMA EXCISION  1998   right shoulder  . TOTAL HIP ARTHROPLASTY  2005   bilat     Current Meds  Medication Sig  . acetaminophen (TYLENOL) 500 MG tablet Take 500 mg by mouth daily.   Marland Kitchen apixaban (ELIQUIS) 5 MG TABS tablet Take 1 tablet (5 mg total) by mouth 2 (two) times daily.  Marland Kitchen atorvastatin (LIPITOR) 40 MG tablet Take 1  tablet (40 mg total) by mouth at bedtime.  . calcium-vitamin D (OSCAL WITH D) 500-200 MG-UNIT per tablet Take 1 tablet by mouth daily.  Marland Kitchen diltiazem (CARDIZEM CD) 120 MG 24 hr capsule Take 1 capsule (120 mg total) by mouth daily.  . enalapril (VASOTEC) 5 MG tablet Take 1 tablet (5 mg total) by mouth daily.  . fluticasone (FLONASE) 50 MCG/ACT nasal spray Place 2 sprays into both nostrils daily as needed for allergies or rhinitis.  . metoprolol succinate (TOPROL-XL) 100 MG 24 hr tablet Take 1 tablet (100 mg total) by mouth daily. Take with or immediately following a meal.  . Multiple Vitamin (MULTIVITAMIN WITH MINERALS) TABS tablet Take 1 tablet by mouth daily.  Marland Kitchen oxyCODONE-acetaminophen (PERCOCET) 5-325 MG tablet Take 1 tablet by mouth every 8 (eight) hours as needed for severe pain.  Marland Kitchen triamcinolone cream (KENALOG) 0.1 % Apply 1 application topically daily as needed (skin cancer).  Allergies:   Patient has no known allergies.   Social History   Tobacco Use  . Smoking status: Former Smoker    Packs/day: 1.00    Years: 30.00    Pack years: 30.00  . Smokeless tobacco: Never Used  Substance Use Topics  . Alcohol use: Yes    Alcohol/week: 0.0 standard drinks    Comment: 1 glass wine daily  . Drug use: No     Family Hx: The patient's family history includes Cancer (age of onset: 79) in her father; Cancer (age of onset: 45) in her mother; Hypertension in her paternal grandfather. There is no history of CAD, Stroke, Diabetes, Heart attack, or Breast cancer.  ROS:   Please see the history of present illness.     All other systems reviewed and are negative.   Prior CV studies:   The following studies were reviewed today:  Reviewed most recent angiogram done in February.  Labs/Other Tests and Data Reviewed:    EKG:  No ECG reviewed.  Recent Labs: 02/16/2018: ALT 16; ALT 16; TSH 1.88 06/15/2018: BUN 20; Creatinine, Ser 1.04; Hemoglobin 16.3; Platelets 211; Potassium 5.1; Sodium 140    Recent Lipid Panel Lab Results  Component Value Date/Time   CHOL 135 02/16/2018 09:24 AM   TRIG 109 02/16/2018 09:24 AM   TRIG 144 03/28/2011   HDL 40 (L) 02/16/2018 09:24 AM   CHOLHDL 3.4 02/16/2018 09:24 AM   LDLCALC 76 02/16/2018 09:24 AM   LDLDIRECT 78 03/28/2011    Wt Readings from Last 3 Encounters:  08/04/18 144 lb 9.6 oz (65.6 kg)  07/13/18 145 lb (65.8 kg)  06/23/18 135 lb (61.2 kg)     Objective:    Vital Signs:  There were no vitals taken for this visit.   VITAL SIGNS:  reviewed GEN:  no acute distress EYES:  sclerae anicteric, EOMI - Extraocular Movements Intact RESPIRATORY:  normal respiratory effort, symmetric expansion SKIN:  no rash, lesions or ulcers. MUSCULOSKELETAL:  no obvious deformities. NEURO:  alert and oriented x 3, no obvious focal deficit PSYCH:  normal affect  ASSESSMENT & PLAN:    1.  Peripheral arterial disease:  bilateral calf claudication equal in both sides.     Improvement in symptoms after starting an exercise program.  Although she has claudications, her symptoms are not lifestyle limiting and thus I recommend continuing medical therapy.    If revascularization is needed in the future, the disease on the right lower extremity requires right femoral endarterectomy into the ostium of the SFA or atherectomy as a second choice.  On the left side, the whole SFA will need to be treated.  2.  bilateral subclavian stenosis with arm claudication worse on the left side, the patient has borderline stenosis affecting the right subclavian artery and significant stenosis affecting the left axillary artery.   Her symptoms are stable at the present time.  3.  Carotid disease: Recent carotid Doppler showed less than 50% stenosis bilaterally.  4.  Atrial fibrillation: Ventricular rate is controlled and she is tolerating anticoagulation.  5.  Hyperlipidemia: Continue treatment with atorvastatin with a target LDL of less than 70.   COVID-19  Education: The signs and symptoms of COVID-19 were discussed with the patient and how to seek care for testing (follow up with PCP or arrange E-visit).  The importance of social distancing was discussed today.  Time:   Today, I have spent 8 minutes with the patient with telehealth technology discussing the above problems.  Medication Adjustments/Labs and Tests Ordered: Current medicines are reviewed at length with the patient today.  Concerns regarding medicines are outlined above.   Tests Ordered: No orders of the defined types were placed in this encounter.   Medication Changes: No orders of the defined types were placed in this encounter.   Follow Up:  In Person in 6 month(s)  Signed, Kathlyn Sacramento, MD  11/16/2018 9:37 AM    [facility]

## 2018-11-16 NOTE — Patient Instructions (Signed)
Medication Instructions:  The current medical regimen is effective;  continue present plan and medications.  If you need a refill on your cardiac medications before your next appointment, please call your pharmacy.    Follow-Up: At [facility], you and your health needs are our priority.  As part of our continuing mission to provide you with exceptional heart care, we have created designated Provider Care Teams.  These Care Teams include your primary Cardiologist (physician) and Advanced Practice Providers (APPs -  Physician Assistants and Nurse Practitioners) who all work together to provide you with the care you need, when you need it. You will need a follow up appointment in 6 months.  Please call our office 2 months in advance to schedule this appointment.  You may see Dr.Arida or one of the following Advanced Practice Providers on your designated Care Team:   Luke Kilroy, PA-C Krista Kroeger, PA-C . Callie Goodrich, PA-C    

## 2018-12-07 ENCOUNTER — Ambulatory Visit: Payer: Medicare Other | Admitting: Internal Medicine

## 2018-12-25 ENCOUNTER — Other Ambulatory Visit: Payer: Self-pay | Admitting: Interventional Cardiology

## 2018-12-27 NOTE — Telephone Encounter (Signed)
67f 65.6kg Scr 1.04 06/15/18 Lovw/arida 11/16/18

## 2018-12-30 ENCOUNTER — Other Ambulatory Visit: Payer: Self-pay

## 2018-12-30 ENCOUNTER — Ambulatory Visit (INDEPENDENT_AMBULATORY_CARE_PROVIDER_SITE_OTHER): Payer: Medicare Other | Admitting: Internal Medicine

## 2018-12-30 ENCOUNTER — Encounter: Payer: Self-pay | Admitting: Internal Medicine

## 2018-12-30 VITALS — Ht 62.0 in | Wt 144.0 lb

## 2018-12-30 DIAGNOSIS — Z85828 Personal history of other malignant neoplasm of skin: Secondary | ICD-10-CM

## 2018-12-30 DIAGNOSIS — I6523 Occlusion and stenosis of bilateral carotid arteries: Secondary | ICD-10-CM

## 2018-12-30 DIAGNOSIS — R7303 Prediabetes: Secondary | ICD-10-CM

## 2018-12-30 DIAGNOSIS — I4891 Unspecified atrial fibrillation: Secondary | ICD-10-CM

## 2018-12-30 DIAGNOSIS — R7989 Other specified abnormal findings of blood chemistry: Secondary | ICD-10-CM

## 2018-12-30 DIAGNOSIS — D0360 Melanoma in situ of unspecified upper limb, including shoulder: Secondary | ICD-10-CM | POA: Diagnosis not present

## 2018-12-30 DIAGNOSIS — I158 Other secondary hypertension: Secondary | ICD-10-CM

## 2018-12-30 DIAGNOSIS — N390 Urinary tract infection, site not specified: Secondary | ICD-10-CM

## 2018-12-30 DIAGNOSIS — Z1329 Encounter for screening for other suspected endocrine disorder: Secondary | ICD-10-CM

## 2018-12-30 DIAGNOSIS — E785 Hyperlipidemia, unspecified: Secondary | ICD-10-CM

## 2018-12-30 DIAGNOSIS — D751 Secondary polycythemia: Secondary | ICD-10-CM

## 2018-12-30 NOTE — Progress Notes (Signed)
Telephone Note  I connected with [name]  on 12/30/18 at  8:30 AM EDT telephone and verified that I am speaking with the correct person using two identifiers.  Location patient: home Location provider:work or home office Persons participating in the virtual visit: patient, provider  I discussed the limitations of evaluation and management by telemedicine and the availability of in person appointments. The patient expressed understanding and agreed to proceed.   HPI: 1. Left wrist pain better she had hairline fracture currently w/o sx's  2. HTN BP controlled 127/81 dilt 120 mg qd, vasotec 5 mg qd, toprol 100 mg xl  3. Afib no issues due to f/u with Dr. Fletcher Anon 05/2019 pt will call them  4. 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 She is not sure if she snores OSA also on Ddx   ROS: See pertinent positives and negatives per HPI.  Past Medical History:  Diagnosis Date  . Arthritis    hands, knees, ankles  . BCC (basal cell carcinoma of skin) 2016   Stinehelfer  . Bilateral carotid artery stenosis    a. s/p right carotid stent after restenosis post CEA, left carotid stent (previously on Plavix for these). b. Carotid US 03/2016: stable 40-59% BICA.  Marland Kitchen Fibrocystic breast disease    followed by surgery Jamal Collin)  . GERD (gastroesophageal reflux disease)    OTC prilosec  . Hernia 2005  . History of chicken pox   . History of colon polyps   . History of melanoma 1990s   right shoulder  . HLD (hyperlipidemia)   . HTN (hypertension) 1970s  . Hypertensive retinopathy of both eyes, grade 1 2017   Bulakowski  . Kidney cyst, acquired 2016   large R septated 8cm, stable 2017  . Melanoma in situ ([place]) 2017   R medial posterior arm - lentio maligna type, margin involved ([place])  . PAF (paroxysmal atrial fibrillation) ([place])    a. dx  12/2015 at routine physical.    Past Surgical History:  Procedure Laterality Date  . ABDOMINAL AORTOGRAM W/LOWER EXTREMITY N/A 06/23/2018   Procedure: ABDOMINAL AORTOGRAM W/LOWER EXTREMITY;  Surgeon: Wellington Hampshire, MD;  Location: [facility];  Service: Cardiovascular;  Laterality: N/A;  . ABI  2007   R 1.19, L 1.2  . AORTIC ARCH ANGIOGRAPHY N/A 06/23/2018   Procedure: AORTIC ARCH ANGIOGRAPHY;  Surgeon: Wellington Hampshire, MD;  Location: [facility];  Service: Cardiovascular;  Laterality: N/A;  . APPENDECTOMY  1995  . BREAST BIOPSY Left 1997   left  . BREAST CYST ASPIRATION Left 02/1997   fna  . BREAST EXCISIONAL BIOPSY Right 1998   benign per pt  . CARDIOVASCULAR STRESS TEST  ~2003   WNL  . CAROTID ENDARTERECTOMY  2002   bilateral  . CAROTID STENT Right 2002   right after restenosis post CEA (Dr. Edd Arbour)  . CAROTID STENT  2014   for stenosis  . CAROTID STENT Left 2014   Dr. Ardeth Sportsman, [place]  . CESAREAN SECTION  1968; 1972  . COLONOSCOPY  03/2012   Sankar - polyp (rec rpt 5 yrs)  . EXPLORATORY LAPAROTOMY  1990s   for GI pain, inflamed lower GI, unrevealing workup, improved with abx  . HERNIA REPAIR  2005   abdomen  . MELANOMA EXCISION  1998   right shoulder  .  TOTAL HIP ARTHROPLASTY  2005   bilat    Family History  Problem Relation Age of Onset  . Cancer Mother 61       lung, smoker  . Cancer Father 85       prostate  . Hypertension Paternal Grandfather   . CAD Neg Hx   . Stroke Neg Hx   . Diabetes Neg Hx   . Heart attack Neg Hx   . Breast cancer Neg Hx     SOCIAL HX: lives at home has children   Current Outpatient Medications:  .  acetaminophen (TYLENOL) 500 MG tablet, Take 500 mg by mouth daily. , Disp: , Rfl:  .  atorvastatin (LIPITOR) 40 MG tablet, Take 1 tablet (40 mg total) by mouth at bedtime., Disp: 90 tablet, Rfl: 3 .  Cholecalciferol (VITAMIN D-3) 25 MCG (1000 UT) CAPS, Take by mouth., Disp: , Rfl:  .  diltiazem (CARDIZEM CD) 120 MG 24  hr capsule, Take 1 capsule (120 mg total) by mouth daily., Disp: 90 capsule, Rfl: 3 .  ELIQUIS 5 MG TABS tablet, TAKE 1 TABLET BY MOUTH TWO  TIMES DAILY, Disp: 180 tablet, Rfl: 3 .  enalapril (VASOTEC) 5 MG tablet, Take 1 tablet (5 mg total) by mouth daily., Disp: 90 tablet, Rfl: 3 .  fluticasone (FLONASE) 50 MCG/ACT nasal spray, Place 2 sprays into both nostrils daily as needed for allergies or rhinitis., Disp: , Rfl:  .  metoprolol succinate (TOPROL-XL) 100 MG 24 hr tablet, Take 1 tablet (100 mg total) by mouth daily. Take with or immediately following a meal., Disp: 90 tablet, Rfl: 3 .  Multiple Vitamin (MULTIVITAMIN WITH MINERALS) TABS tablet, Take 1 tablet by mouth daily., Disp: , Rfl:  .  triamcinolone cream (KENALOG) 0.1 %, Apply 1 application topically daily as needed (skin cancer)., Disp: , Rfl:   EXAM:  VITALS per patient if applicable:  GENERAL: alert, oriented, appears well and in no acute distress  HEENT: atraumatic, conjunttiva clear, no obvious abnormalities on inspection of external nose and ears  NECK: normal movements of the head and neck  LUNGS: on inspection no signs of respiratory distress, breathing rate appears normal, no obvious gross SOB, gasping or wheezing  CV: no obvious cyanosis  MS: moves all visible extremities without noticeable abnormality  PSYCH/NEURO: pleasant and cooperative, no obvious depression or anxiety, speech and thought processing grossly intact  ASSESSMENT AND PLAN:  Discussed the following assessment and plan:  Other secondary hypertension - Plan: Comprehensive metabolic panel, Lipid panel Controlled 127/81 the other day per pt  Cont meds   Hyperlipidemia, unspecified hyperlipidemia type - Plan: Lipid panel  Bilateral carotid artery stenosis  Atrial fibrillation, b/l CAS - Plan: TSH  Polycythemia - Plan: CBC with Differential/Platelet, jak 2 negative, CT ab/pelvis no etiology she is drinking enough water -consider hematology in  future  Prediabetes - Plan: Hemoglobin A1c  Urinary tract infection without hematuria, site unspecified - Plan: Urinalysis, Routine w reflex microscopic, Urine Culture  HM Had flu shot 02/02/18 [facility] rec Tdap per pt had 10/13/12 consider in 2024 unclear if had Td vs Tdap Given rx shingrix vaccine has not had yet as of 12/30/18 pt will check with insurance pna 23 utd and prevnar utd  Check fasting labs upcoming   mammo  06/07/18 negative  sch dEXa 06/07/18 osteopenia vitamin D 1000 Mg qd  Colonoscopy 03/03/12 Sankar polyps x 2 hyperplastic f/u with Dr. Bary Castilla likely no other needed  Pap records [facility] -  4/28/14neg pap neg HPV Dr. Laurey Morale [facility]. atrophy+ Former smoker assess at f/u # years and [place]. Dermatology [place] derm saw 03/2018 had right forearm lesion and will f/u 01/2019 h/o skin cancer MM and NMSC    I discussed the assessment and treatment plan with the patient. The patient was provided an opportunity to ask questions and all were answered. The patient agreed with the plan and demonstrated an understanding of the instructions.   The patient was advised to call back or seek an in-person evaluation if the symptoms worsen or if the condition fails to improve as anticipated.  Time spent 15 minutes  Delorise Jackson, MD

## 2019-01-19 DIAGNOSIS — Z85828 Personal history of other malignant neoplasm of skin: Secondary | ICD-10-CM | POA: Diagnosis not present

## 2019-01-19 DIAGNOSIS — L821 Other seborrheic keratosis: Secondary | ICD-10-CM | POA: Diagnosis not present

## 2019-01-19 DIAGNOSIS — L570 Actinic keratosis: Secondary | ICD-10-CM | POA: Diagnosis not present

## 2019-01-19 DIAGNOSIS — Z85820 Personal history of malignant melanoma of skin: Secondary | ICD-10-CM | POA: Diagnosis not present

## 2019-02-17 ENCOUNTER — Other Ambulatory Visit (INDEPENDENT_AMBULATORY_CARE_PROVIDER_SITE_OTHER): Payer: Medicare Other

## 2019-02-17 ENCOUNTER — Other Ambulatory Visit: Payer: Self-pay

## 2019-02-17 DIAGNOSIS — R7303 Prediabetes: Secondary | ICD-10-CM | POA: Diagnosis not present

## 2019-02-17 DIAGNOSIS — N390 Urinary tract infection, site not specified: Secondary | ICD-10-CM | POA: Diagnosis not present

## 2019-02-17 DIAGNOSIS — I4891 Unspecified atrial fibrillation: Secondary | ICD-10-CM

## 2019-02-17 DIAGNOSIS — Z1329 Encounter for screening for other suspected endocrine disorder: Secondary | ICD-10-CM | POA: Diagnosis not present

## 2019-02-17 DIAGNOSIS — D751 Secondary polycythemia: Secondary | ICD-10-CM | POA: Diagnosis not present

## 2019-02-17 DIAGNOSIS — I158 Other secondary hypertension: Secondary | ICD-10-CM | POA: Diagnosis not present

## 2019-02-17 DIAGNOSIS — E785 Hyperlipidemia, unspecified: Secondary | ICD-10-CM | POA: Diagnosis not present

## 2019-02-17 DIAGNOSIS — R7989 Other specified abnormal findings of blood chemistry: Secondary | ICD-10-CM

## 2019-02-17 LAB — CBC WITH DIFFERENTIAL/PLATELET
Basophils Absolute: 0.1 10*3/uL (ref 0.0–0.1)
Basophils Relative: 1.1 % (ref 0.0–3.0)
Eosinophils Absolute: 0.1 10*3/uL (ref 0.0–0.7)
Eosinophils Relative: 1.7 % (ref 0.0–5.0)
HCT: 47.4 % — ABNORMAL HIGH (ref 36.0–46.0)
Hemoglobin: 15.8 g/dL — ABNORMAL HIGH (ref 12.0–15.0)
Lymphocytes Relative: 33.9 % (ref 12.0–46.0)
Lymphs Abs: 1.8 10*3/uL (ref 0.7–4.0)
MCHC: 33.4 g/dL (ref 30.0–36.0)
MCV: 99.8 fl (ref 78.0–100.0)
Monocytes Absolute: 0.5 10*3/uL (ref 0.1–1.0)
Monocytes Relative: 8.9 % (ref 3.0–12.0)
Neutro Abs: 2.9 10*3/uL (ref 1.4–7.7)
Neutrophils Relative %: 54.4 % (ref 43.0–77.0)
Platelets: 242 10*3/uL (ref 150.0–400.0)
RBC: 4.75 Mil/uL (ref 3.87–5.11)
RDW: 14.5 % (ref 11.5–15.5)
WBC: 5.4 10*3/uL (ref 4.0–10.5)

## 2019-02-17 LAB — COMPREHENSIVE METABOLIC PANEL
ALT: 25 U/L (ref 0–35)
AST: 17 U/L (ref 0–37)
Albumin: 4.4 g/dL (ref 3.5–5.2)
Alkaline Phosphatase: 113 U/L (ref 39–117)
BUN: 21 mg/dL (ref 6–23)
CO2: 29 mEq/L (ref 19–32)
Calcium: 9.8 mg/dL (ref 8.4–10.5)
Chloride: 102 mEq/L (ref 96–112)
Creatinine, Ser: 1.03 mg/dL (ref 0.40–1.20)
GFR: 51.52 mL/min — ABNORMAL LOW (ref >60.00)
Glucose, Bld: 90 mg/dL (ref 70–99)
Potassium: 4.5 mEq/L (ref 3.5–5.1)
Sodium: 139 mEq/L (ref 135–145)
Total Bilirubin: 0.8 mg/dL (ref 0.2–1.2)
Total Protein: 8 g/dL (ref 6.0–8.3)

## 2019-02-17 LAB — LIPID PANEL
Cholesterol: 136 mg/dL (ref 0–200)
HDL: 41 mg/dL (ref >39.00)
LDL Cholesterol: 75 mg/dL (ref 0–99)
NonHDL: 94.77
Total CHOL/HDL Ratio: 3
Triglycerides: 97 mg/dL (ref 0.0–149.0)
VLDL: 19.4 mg/dL (ref 0.0–40.0)

## 2019-02-17 LAB — TSH: TSH: 2.53 u[IU]/mL (ref 0.35–4.50)

## 2019-02-17 LAB — HEMOGLOBIN A1C: Hgb A1c MFr Bld: 6 % (ref 4.6–6.5)

## 2019-02-17 NOTE — Addendum Note (Signed)
Addended by: Leeanne Rio on: 02/17/2019 08:38 AM   Modules accepted: Orders

## 2019-02-17 NOTE — Addendum Note (Signed)
Addended by: Leeanne Rio on: 02/17/2019 12:19 PM   Modules accepted: Orders

## 2019-02-18 LAB — IRON,TIBC AND FERRITIN PANEL
%SAT: 36 % (calc) (ref 16–45)
Ferritin: 356 ng/mL — ABNORMAL HIGH (ref 16–288)
Iron: 107 ug/dL (ref 45–160)
TIBC: 294 mcg/dL (calc) (ref 250–450)

## 2019-02-19 LAB — URINALYSIS, ROUTINE W REFLEX MICROSCOPIC
Bilirubin Urine: NEGATIVE
Glucose, UA: NEGATIVE
Hyaline Cast: NONE SEEN /LPF
Ketones, ur: NEGATIVE
Nitrite: POSITIVE — AB
Specific Gravity, Urine: 1.016 (ref 1.001–1.03)
WBC, UA: >=60 /HPF — AB (ref 0–5)
pH: 5.5 (ref 5.0–8.0)

## 2019-02-19 LAB — URINE CULTURE
MICRO NUMBER:: 969156
SPECIMEN QUALITY:: ADEQUATE

## 2019-02-24 ENCOUNTER — Other Ambulatory Visit: Payer: Self-pay | Admitting: Internal Medicine

## 2019-02-24 DIAGNOSIS — N3000 Acute cystitis without hematuria: Secondary | ICD-10-CM

## 2019-02-24 MED ORDER — NITROFURANTOIN MONOHYD MACRO 100 MG PO CAPS: 100.0000 mg | ORAL_CAPSULE | Freq: Two times a day (BID) | ORAL | 0 refills | Status: DC

## 2019-03-08 NOTE — Progress Notes (Signed)
Virtual Visit via Video Note   This visit type was conducted due to national recommendations for restrictions regarding the COVID-19 Pandemic (e.g. social distancing) in an effort to limit this patient's exposure and mitigate transmission in our community.  Due to her co-morbid illnesses, this patient is at least at moderate risk for complications without adequate follow up.  This format is felt to be most appropriate for this patient at this time.  All issues noted in this document were discussed and addressed.  A limited physical exam was performed with this format.  Please refer to the patient's chart for her consent to telehealth for [facility].   Date:  03/09/2019   ID:  [name], DOB [birth year], MRN [id number]  Patient Location: Home Provider Location: Home  PCP:  McLean-Scocuzza, Nino Glow, MD  Cardiologist:  Larae Grooms, MD   Electrophysiologist:  None   Evaluation Performed:  Follow-Up Visit  Chief Complaint: f/u  History of Present Illness:    [name] is a 78 y.o. female with history of AFib on eliquis, HTN, HLD, PAD followed by Dr. Perry Mount claudication improved with exercise program, bilateral subclavian stenosis with arm claudication, carotid stenosis less than 50% bilaterally.   LOV Dr. Irish Lack 05/24/18 doing well.  Walks an hour everyday and has improved her claudication symptoms. Sometimes she has to stop to rest and then can continue on. Denies chest pain, palpitations, dyspnea on exertion, dizziness or presyncope, or edema.   The patient does not have symptoms concerning for COVID-19 infection (fever, chills, cough, or new shortness of breath).    Past Medical History:  Diagnosis Date  . Arthritis    hands, knees, ankles  . BCC (basal cell carcinoma of skin) 2016   [place]  . Bilateral carotid artery stenosis    a. s/p right carotid stent after restenosis post CEA, left carotid stent (previously on Plavix for these). b. Carotid US  03/2016: stable 40-59% BICA.  Marland Kitchen Fibrocystic breast disease    followed by surgery Jamal Collin)  . GERD (gastroesophageal reflux disease)    OTC prilosec  . Hernia 2005  . History of chicken pox   . History of colon polyps   . History of melanoma 1990s   right shoulder  . HLD (hyperlipidemia)   . HTN (hypertension) 1970s  . Hypertensive retinopathy of both eyes, grade 1 2017   [place]  . Kidney cyst, acquired 2016   large R septated 8cm, stable 2017  . Melanoma in situ ([place]) 2017   R medial posterior arm - lentio maligna type, margin involved ([place])  . PAF (paroxysmal atrial fibrillation) ([place])    a. dx 12/2015 at routine physical.   Past Surgical History:  Procedure Laterality Date  . ABDOMINAL AORTOGRAM W/LOWER EXTREMITY N/A 06/23/2018   Procedure: ABDOMINAL AORTOGRAM W/LOWER EXTREMITY;  Surgeon: Wellington Hampshire, MD;  Location: [facility];  Service: Cardiovascular;  Laterality: N/A;  . ABI  2007   R 1.19, L 1.2  . AORTIC ARCH ANGIOGRAPHY N/A 06/23/2018   Procedure: AORTIC ARCH ANGIOGRAPHY;  Surgeon: Wellington Hampshire, MD;  Location: [facility];  Service: Cardiovascular;  Laterality: N/A;  . APPENDECTOMY  1995  . BREAST BIOPSY Left 1997   left  . BREAST CYST ASPIRATION Left 02/1997   fna  . BREAST EXCISIONAL BIOPSY Right 1998   benign per pt  . CARDIOVASCULAR STRESS TEST  ~2003   WNL  . CAROTID ENDARTERECTOMY  2002   bilateral  .  CAROTID STENT Right 2002   right after restenosis post CEA (Dr. Edd Arbour)  . CAROTID STENT  2014   for stenosis  . CAROTID STENT Left 2014   Dr. Ardeth Sportsman, [place]  . CESAREAN SECTION  1968; 1972  . COLONOSCOPY  03/2012   Sankar - polyp (rec rpt 5 yrs)  . EXPLORATORY LAPAROTOMY  1990s   for GI pain, inflamed lower GI, unrevealing workup, improved with abx  . HERNIA REPAIR  2005   abdomen  . MELANOMA EXCISION  1998   right shoulder  . TOTAL HIP ARTHROPLASTY  2005   bilat     Current Meds  Medication Sig  . acetaminophen  (TYLENOL) 500 MG tablet Take 500 mg by mouth daily.   Marland Kitchen apixaban (ELIQUIS) 5 MG TABS tablet Take 1 tablet (5 mg total) by mouth 2 (two) times daily.  Marland Kitchen atorvastatin (LIPITOR) 40 MG tablet Take 1 tablet (40 mg total) by mouth at bedtime.  . Cholecalciferol (VITAMIN D-3) 25 MCG (1000 UT) CAPS Take by mouth.  . diltiazem (CARDIZEM CD) 120 MG 24 hr capsule Take 1 capsule (120 mg total) by mouth daily.  . enalapril (VASOTEC) 5 MG tablet Take 1 tablet (5 mg total) by mouth daily.  . fluticasone (FLONASE) 50 MCG/ACT nasal spray Place 2 sprays into both nostrils daily as needed for allergies or rhinitis.  . metoprolol succinate (TOPROL-XL) 100 MG 24 hr tablet Take 1 tablet (100 mg total) by mouth daily. Take with or immediately following a meal.  . Multiple Vitamin (MULTIVITAMIN WITH MINERALS) TABS tablet Take 1 tablet by mouth daily.  . nitrofurantoin, macrocrystal-monohydrate, (MACROBID) 100 MG capsule Take 1 capsule (100 mg total) by mouth 2 (two) times daily. With food  . triamcinolone cream (KENALOG) 0.1 % Apply 1 application topically daily as needed (skin cancer).  . [DISCONTINUED] atorvastatin (LIPITOR) 40 MG tablet Take 1 tablet (40 mg total) by mouth at bedtime.  . [DISCONTINUED] diltiazem (CARDIZEM CD) 120 MG 24 hr capsule Take 1 capsule (120 mg total) by mouth daily.  . [DISCONTINUED] ELIQUIS 5 MG TABS tablet TAKE 1 TABLET BY MOUTH TWO  TIMES DAILY  . [DISCONTINUED] enalapril (VASOTEC) 5 MG tablet Take 1 tablet (5 mg total) by mouth daily.  . [DISCONTINUED] metoprolol succinate (TOPROL-XL) 100 MG 24 hr tablet Take 1 tablet (100 mg total) by mouth daily. Take with or immediately following a meal.     Allergies:   Patient has no known allergies.   Social History   Tobacco Use  . Smoking status: Former Smoker    Packs/day: 1.00    Years: 30.00    Pack years: 30.00  . Smokeless tobacco: Never Used  Substance Use Topics  . Alcohol use: Yes    Alcohol/week: 0.0 standard drinks     Comment: 1 glass wine daily  . Drug use: No     Family Hx: The patient's family history includes Cancer (age of onset: 33) in her father; Cancer (age of onset: 63) in her mother; Hypertension in her paternal grandfather. There is no history of CAD, Stroke, Diabetes, Heart attack, or Breast cancer.  ROS:   Please see the history of present illness.      All other systems reviewed and are negative.   Prior CV studies:   The following studies were reviewed today:  Echo 2017Study Conclusions   - Left ventricle: The cavity size was normal. There was mild focal   basal hypertrophy of the septum. Systolic function was normal.  The estimated ejection fraction was in the range of 55% to 60%.   Wall motion was normal; there were no regional wall motion   abnormalities. Features are consistent with a pseudonormal left   ventricular filling pattern, with concomitant abnormal relaxation   and increased filling pressure (grade 2 diastolic dysfunction). - Aortic valve: There was mild regurgitation. - Mitral valve: Calcified annulus. There was mild regurgitation. - Left atrium: The atrium was moderately dilated. - Pulmonary arteries: Systolic pressure was mildly increased. PA   peak pressure: 43 mm Hg (S).   Impressions:   - Normal LV systolic function; grade 2 diastolic dysfunction; mild   AI and MR; moderate LAE; trace TR with mildly elevated pulmonary   pressure.   Peripheral catheterization 06/2018 1.  Moderate right subclavian artery stenosis at the origin of the [place]. 2.  No significant obstructive disease affecting the left subclavian artery but there is significant stenosis affecting the axillary artery. 3.  Right lower extremity: Significant calcified common femoral artery disease extending into the ostium of the SFA with occluded popliteal artery and two-vessel runoff below the knee. 4.  Left lower extremity: Borderline significant common iliac artery stenosis with diffuse moderate  disease affecting the SFA and severe focal stenosis distally with three-vessel runoff below the knee.  Collaterals are noted on both sides.   Recommendations: I think we have to tailor revascularization to the patient's symptoms given multiple areas affected.  The patient has heavily calcified disease consistent with prolonged atherosclerosis and not vasculitis. Revascularization of the right lower extremity requires common femoral artery endarterectomy into the ostium of the SFA.  On the left, the whole SFA likely will need to be treated to get good results.   Labs/Other Tests and Data Reviewed:    EKG:  An ECG dated 05/24/18 was personally reviewed today and demonstrated:  Afib at 86/m no acute change  Recent Labs: 02/17/2019: ALT 25; BUN 21; Creatinine, Ser 1.03; Hemoglobin 15.8; Platelets 242.0; Potassium 4.5; Sodium 139; TSH 2.53   Recent Lipid Panel Lab Results  Component Value Date/Time   CHOL 136 02/17/2019 08:03 AM   TRIG 97.0 02/17/2019 08:03 AM   TRIG 144 03/28/2011   HDL 41.00 02/17/2019 08:03 AM   CHOLHDL 3 02/17/2019 08:03 AM   LDLCALC 75 02/17/2019 08:03 AM   LDLCALC 76 02/16/2018 09:24 AM   LDLDIRECT 78 03/28/2011    Wt Readings from Last 3 Encounters:  03/09/19 145 lb (65.8 kg)  12/30/18 144 lb (65.3 kg)  08/04/18 144 lb 9.6 oz (65.6 kg)     Objective:    Vital Signs:  BP 128/79   Ht 5\' 2"  (1.575 m)   Wt 145 lb (65.8 kg)   BMI 26.52 kg/m    VITAL SIGNS:  reviewed  ASSESSMENT & PLAN:    Persistent Afib on Eliquis renal and cbc 02/17/19 stable. No palpitations or bleeding problems  Essential HTN BP well controlled on enalapril, metoprolol and diltiazem.  HLD LDL 75 02/2019 on lipitor  PAD with subclavian, carotid and lower extremity claudication followed by Dr. Fletcher Anon  COVID-19 Education: The signs and symptoms of COVID-19 were discussed with the patient and how to seek care for testing (follow up with PCP or arrange E-visit).   The importance of  social distancing was discussed today.  Time:   Today, I have spent 7:45  minutes with the patient with telehealth technology discussing the above problems.     Medication Adjustments/Labs and Tests Ordered: Current medicines are reviewed  at length with the patient today.  Concerns regarding medicines are outlined above.   Tests Ordered: No orders of the defined types were placed in this encounter.   Medication Changes: Meds ordered this encounter  Medications  . atorvastatin (LIPITOR) 40 MG tablet    Sig: Take 1 tablet (40 mg total) by mouth at bedtime.    Dispense:  90 tablet    Refill:  3  . diltiazem (CARDIZEM CD) 120 MG 24 hr capsule    Sig: Take 1 capsule (120 mg total) by mouth daily.    Dispense:  90 capsule    Refill:  3  . apixaban (ELIQUIS) 5 MG TABS tablet    Sig: Take 1 tablet (5 mg total) by mouth 2 (two) times daily.    Dispense:  180 tablet    Refill:  1  . enalapril (VASOTEC) 5 MG tablet    Sig: Take 1 tablet (5 mg total) by mouth daily.    Dispense:  90 tablet    Refill:  3  . metoprolol succinate (TOPROL-XL) 100 MG 24 hr tablet    Sig: Take 1 tablet (100 mg total) by mouth daily. Take with or immediately following a meal.    Dispense:  90 tablet    Refill:  3    Follow Up:  Either In Person or Virtual in 6 month(s) Dr. Irish Lack  Signed, Ermalinda Barrios, PA-C  03/09/2019 12:06 PM    [place]

## 2019-03-09 ENCOUNTER — Encounter: Payer: Self-pay | Admitting: Physician Assistant

## 2019-03-09 ENCOUNTER — Telehealth (INDEPENDENT_AMBULATORY_CARE_PROVIDER_SITE_OTHER): Payer: Medicare Other | Admitting: Physician Assistant

## 2019-03-09 ENCOUNTER — Other Ambulatory Visit: Payer: Self-pay

## 2019-03-09 VITALS — BP 128/79 | Ht 62.0 in | Wt 145.0 lb

## 2019-03-09 DIAGNOSIS — E782 Mixed hyperlipidemia: Secondary | ICD-10-CM

## 2019-03-09 DIAGNOSIS — I10 Essential (primary) hypertension: Secondary | ICD-10-CM | POA: Diagnosis not present

## 2019-03-09 DIAGNOSIS — I739 Peripheral vascular disease, unspecified: Secondary | ICD-10-CM

## 2019-03-09 DIAGNOSIS — I4819 Other persistent atrial fibrillation: Secondary | ICD-10-CM | POA: Diagnosis not present

## 2019-03-09 MED ORDER — METOPROLOL SUCCINATE ER 100 MG PO TB24: 100.0000 mg | ORAL_TABLET | Freq: Every day | ORAL | 3 refills | Status: DC

## 2019-03-09 MED ORDER — ATORVASTATIN CALCIUM 40 MG PO TABS: 40.0000 mg | ORAL_TABLET | Freq: Every day | ORAL | 3 refills | Status: DC

## 2019-03-09 MED ORDER — ENALAPRIL MALEATE 5 MG PO TABS: 5.0000 mg | ORAL_TABLET | Freq: Every day | ORAL | 3 refills | Status: DC

## 2019-03-09 MED ORDER — DILTIAZEM HCL ER COATED BEADS 120 MG PO CP24: 120.0000 mg | ORAL_CAPSULE | Freq: Every day | ORAL | 3 refills | Status: DC

## 2019-03-09 MED ORDER — APIXABAN 5 MG PO TABS: 5.0000 mg | ORAL_TABLET | Freq: Two times a day (BID) | ORAL | 1 refills | Status: DC

## 2019-03-09 NOTE — Patient Instructions (Signed)
Medication Instructions:  Your physician recommends that you continue on your current medications as directed. Please refer to the Current Medication list given to you today.  *If you need a refill on your cardiac medications before your next appointment, please call your pharmacy*  Lab Work: None ordered  If you have labs (blood work) drawn today and your tests are completely normal, you will receive your results only by: . MyChart Message (if you have MyChart) OR . A paper copy in the mail If you have any lab test that is abnormal or we need to change your treatment, we will call you to review the results.  Testing/Procedures: None ordered  Follow-Up: At [facility], you and your health needs are our priority.  As part of our continuing mission to provide you with exceptional heart care, we have created designated Provider Care Teams.  These Care Teams include your primary Cardiologist (physician) and Advanced Practice Providers (APPs -  Physician Assistants and Nurse Practitioners) who all work together to provide you with the care you need, when you need it.  Your next appointment:   6 month(s)  The format for your next appointment:   Either In Person or Virtual  Provider:   You may see Jayadeep Varanasi, MD or one of the following Advanced Practice Providers on your designated Care Team:    Dayna Dunn, PA-C  Michele Lenze, PA-C   Other Instructions   

## 2019-04-06 ENCOUNTER — Other Ambulatory Visit: Payer: Self-pay

## 2019-04-06 ENCOUNTER — Ambulatory Visit: Payer: Medicare Other

## 2019-04-06 ENCOUNTER — Ambulatory Visit (INDEPENDENT_AMBULATORY_CARE_PROVIDER_SITE_OTHER): Payer: Medicare Other

## 2019-04-06 DIAGNOSIS — Z0000 Encounter for general adult medical examination without abnormal findings: Secondary | ICD-10-CM

## 2019-04-06 NOTE — Patient Instructions (Addendum)
  [name] , Thank you for taking time to come for your Medicare Wellness Visit. I appreciate your ongoing commitment to your health goals. Please review the following plan we discussed and let me know if I can assist you in the future.   These are the goals we discussed: Goals    . Follow up with Primary Care Provider     As needed       This is a list of the screening recommended for you and due dates:  Health Maintenance  Topic Date Due  . Tetanus Vaccine  10/14/2022  . Flu Shot  Completed  . DEXA scan (bone density measurement)  Completed  . Pneumonia vaccines  Completed

## 2019-04-06 NOTE — Progress Notes (Signed)
Subjective:   [name] is a 78 y.o. female who presents for Medicare Annual (Subsequent) preventive examination.  Review of Systems:  No ROS.  Medicare Wellness Virtual Visit.  Visual/audio telehealth visit, UTA vital signs.   See social history for additional risk factors.   Cardiac Risk Factors include: advanced age (>75men, >48 women);hypertension     Objective:     Vitals: There were no vitals taken for this visit.  There is no height or weight on file to calculate BMI.  Advanced Directives 04/06/2019 06/23/2018  Does Patient Have a Medical Advance Directive? Yes Yes  Type of Paramedic of [place];Living will [place];Living will  Does patient want to make changes to medical advance directive? No - Patient declined No - Patient declined  Copy of [place] in Chart? Yes - validated most recent copy scanned in chart (See row information) No - copy requested    Tobacco Social History   Tobacco Use  Smoking Status Former Smoker  . Packs/day: 1.00  . Years: 30.00  . Pack years: 30.00  Smokeless Tobacco Never Used     Counseling given: Not Answered   Clinical Intake:  Pre-visit preparation completed: Yes           How often do you need to have someone help you when you read instructions, pamphlets, or other written materials from your doctor or pharmacy?: 1 - Never  Interpreter Needed?: No     Past Medical History:  Diagnosis Date  . Arthritis    hands, knees, ankles  . BCC (basal cell carcinoma of skin) 2016   [place]  . Bilateral carotid artery stenosis    a. s/p right carotid stent after restenosis post CEA, left carotid stent (previously on Plavix for these). b. Carotid US 03/2016: stable 40-59% BICA.  Marland Kitchen Fibrocystic breast disease    followed by surgery Jamal Collin)  . GERD (gastroesophageal reflux disease)    OTC prilosec  . Hernia 2005  . History of chicken pox   . History  of colon polyps   . History of melanoma 1990s   right shoulder  . HLD (hyperlipidemia)   . HTN (hypertension) 1970s  . Hypertensive retinopathy of both eyes, grade 1 2017   [place]  . Kidney cyst, acquired 2016   large R septated 8cm, stable 2017  . Melanoma in situ ([place]) 2017   R medial posterior arm - lentio maligna type, margin involved ([place])  . PAF (paroxysmal atrial fibrillation) ([place])    a. dx 12/2015 at routine physical.   Past Surgical History:  Procedure Laterality Date  . ABDOMINAL AORTOGRAM W/LOWER EXTREMITY N/A 06/23/2018   Procedure: ABDOMINAL AORTOGRAM W/LOWER EXTREMITY;  Surgeon: Wellington Hampshire, MD;  Location: [facility];  Service: Cardiovascular;  Laterality: N/A;  . ABI  2007   R 1.19, L 1.2  . AORTIC ARCH ANGIOGRAPHY N/A 06/23/2018   Procedure: AORTIC ARCH ANGIOGRAPHY;  Surgeon: Wellington Hampshire, MD;  Location: [facility];  Service: Cardiovascular;  Laterality: N/A;  . APPENDECTOMY  1995  . BREAST BIOPSY Left 1997   left  . BREAST CYST ASPIRATION Left 02/1997   fna  . BREAST EXCISIONAL BIOPSY Right 1998   benign per pt  . CARDIOVASCULAR STRESS TEST  ~2003   WNL  . CAROTID ENDARTERECTOMY  2002   bilateral  . CAROTID STENT Right 2002   right after restenosis post CEA (Dr. Edd Arbour)  . CAROTID  STENT  2014   for stenosis  . CAROTID STENT Left 2014   Dr. Ardeth Sportsman, [place]  . CESAREAN SECTION  1968; 1972  . COLONOSCOPY  03/2012   Sankar - polyp (rec rpt 5 yrs)  . EXPLORATORY LAPAROTOMY  1990s   for GI pain, inflamed lower GI, unrevealing workup, improved with abx  . HERNIA REPAIR  2005   abdomen  . MELANOMA EXCISION  1998   right shoulder  . TOTAL HIP ARTHROPLASTY  2005   bilat   Family History  Problem Relation Age of Onset  . Cancer Mother 61       lung, smoker  . Cancer Father 12       prostate  . Hypertension Paternal Grandfather   . CAD Neg Hx   . Stroke Neg Hx   . Diabetes Neg Hx   . Heart attack Neg Hx   . Breast  cancer Neg Hx    Social History   Socioeconomic History  . Marital status: Widowed    Spouse name: Not on file  . Number of children: Not on file  . Years of education: Not on file  . Highest education level: Not on file  Occupational History  . Not on file  Social Needs  . Financial resource strain: Not hard at all  . Food insecurity    Worry: Never true    Inability: Never true  . Transportation needs    Medical: No    Non-medical: No  Tobacco Use  . Smoking status: Former Smoker    Packs/day: 1.00    Years: 30.00    Pack years: 30.00  . Smokeless tobacco: Never Used  Substance and Sexual Activity  . Alcohol use: Yes    Alcohol/week: 0.0 standard drinks    Comment: 1 glass wine daily  . Drug use: No  . Sexual activity: Not on file  Lifestyle  . Physical activity    Days per week: 3 days    Minutes per session: 60 min  . Stress: Not at all  Relationships  . Social Herbalist on phone: Not on file    Gets together: Not on file    Attends religious service: Not on file    Active member of club or organization: Not on file    Attends meetings of clubs or organizations: Not on file    Relationship status: Not on file  Other Topics Concern  . Not on file  Social History Narrative   Caffeine: 3 cups coffee/day   Widow of husband Jori Moll), lives with 1 dog.  Grown children   Occupation: homemaker   Edu: college   Activity: plays golf, walks dog, swims   Diet: good water, fruits/vegetables daily    Outpatient Encounter Medications as of 04/06/2019  Medication Sig  . acetaminophen (TYLENOL) 500 MG tablet Take 500 mg by mouth daily.   Marland Kitchen apixaban (ELIQUIS) 5 MG TABS tablet Take 1 tablet (5 mg total) by mouth 2 (two) times daily.  Marland Kitchen atorvastatin (LIPITOR) 40 MG tablet Take 1 tablet (40 mg total) by mouth at bedtime.  . Cholecalciferol (VITAMIN D-3) 25 MCG (1000 UT) CAPS Take by mouth.  . diltiazem (CARDIZEM CD) 120 MG 24 hr capsule Take 1 capsule (120 mg  total) by mouth daily.  . enalapril (VASOTEC) 5 MG tablet Take 1 tablet (5 mg total) by mouth daily.  . fluticasone (FLONASE) 50 MCG/ACT nasal spray Place 2 sprays into both nostrils daily as  needed for allergies or rhinitis.  . metoprolol succinate (TOPROL-XL) 100 MG 24 hr tablet Take 1 tablet (100 mg total) by mouth daily. Take with or immediately following a meal.  . Multiple Vitamin (MULTIVITAMIN WITH MINERALS) TABS tablet Take 1 tablet by mouth daily.  . nitrofurantoin, macrocrystal-monohydrate, (MACROBID) 100 MG capsule Take 1 capsule (100 mg total) by mouth 2 (two) times daily. With food  . triamcinolone cream (KENALOG) 0.1 % Apply 1 application topically daily as needed (skin cancer).   No facility-administered encounter medications on file as of 04/06/2019.     Activities of Daily Living In your present state of health, do you have any difficulty performing the following activities: 04/06/2019  Hearing? N  Vision? N  Difficulty concentrating or making decisions? N  Walking or climbing stairs? N  Dressing or bathing? N  Doing errands, shopping? N  Preparing Food and eating ? N  Using the Toilet? N  In the past six months, have you accidently leaked urine? N  Do you have problems with loss of bowel control? N  Managing your Medications? N  Managing your Finances? N  Housekeeping or managing your Housekeeping? N  Some recent data might be hidden    Patient Care Team: McLean-Scocuzza, Nino Glow, MD as PCP - General (Internal Medicine) Jettie Booze, MD as PCP - Cardiology (Cardiology) Christene Lye, MD (General Surgery) Margarita Rana, MD as Referring Physician (Family Medicine)    Assessment:   This is a routine wellness examination for [place].  Nurse connected with patient 04/06/19 at  9:00 AM EST by a telephone enabled telemedicine application and verified that I am speaking with the correct person using two identifiers. Patient stated full name and DOB.  Patient gave permission to continue with virtual visit. Patient's location was at home and Nurse's location was at [place] office.   Health Maintenance Due: See completed HM at the end of note.   Eye: Visual acuity not assessed. Virtual visit. Wears corrective lenses. Followed by their ophthalmologist every 12 months.   Dental: Visits every 6 months.    Hearing: Demonstrates normal hearing during visit.  Safety:  Patient feels safe at home- yes Patient does have smoke detectors at home- yes Patient does wear sunscreen or protective clothing when in direct sunlight - yes Patient does wear seat belt when in a moving vehicle - yes Patient drives- yes Adequate lighting in walkways free from debris- yes Grab bars and handrails used as appropriate- yes Ambulates with no assistive device Cell phone on person when ambulating outside of the home- yes  Social: Alcohol intake - yes      Smoking history- former    Smokers in home? none Illicit drug use? none  Depression: PHQ 2 &9 complete. See screening below. Denies irritability, anhedonia, sadness/tearfullness.     Falls: See screening below.    Medication: Taking as directed and without issues.   Covid-19: Precautions and sickness symptoms discussed. Wears mask, social distancing, hand hygiene as appropriate.   Activities of Daily Living Patient denies needing assistance with: household chores, feeding themselves, getting from bed to chair, getting to the toilet, bathing/showering, dressing, managing money, or preparing meals.   Memory: Patient is alert. Patient denies difficulty focusing or concentrating. Correctly identified the president of the [place], season and recall. Patient likes to read, play sodoku and completes puzzles for brain stimulation.   BMI- discussed the importance of a healthy diet, water intake and the benefits of aerobic exercise.  Neurosurgeon  provided.  Physical activity- walking 3 days weekly  with her dog, 4 miles  Diet:  Lean meat, fruits, vegetables Water: good intake Caffeine: 2 cups of coffee  Other Providers Patient Care Team: McLean-Scocuzza, Nino Glow, MD as PCP - General (Internal Medicine) Jettie Booze, MD as PCP - Cardiology (Cardiology) Christene Lye, MD (General Surgery) Margarita Rana, MD as Referring Physician (Family Medicine)  Exercise Activities and Dietary recommendations Current Exercise Habits: Home exercise routine, Type of exercise: walking, Time (Minutes): 60, Frequency (Times/Week): 3, Weekly Exercise (Minutes/Week): 180, Intensity: Mild  Goals    . Follow up with Primary Care Provider     As needed       Fall Risk Fall Risk  04/06/2019 12/30/2018 08/04/2018 06/10/2018 04/23/2018  Falls in the past year? 0 0 0 0 0  Number falls in past yr: - 0 - - -  Follow up Falls prevention discussed;Education provided - - - -   Timed Get Up and Go performed: no, virtual visit  Depression Screen PHQ 2/9 Scores 04/06/2019 12/30/2018 02/11/2018 12/31/2015  PHQ - 2 Score 0 0 0 0     Cognitive Function     6CIT Screen 04/06/2019  What Year? 0 points  What month? 0 points  What time? 0 points  Count back from 20 0 points  Months in reverse 0 points  Repeat phrase 0 points  Total Score 0    Immunization History  Administered Date(s) Administered  . Fluad Quad(high Dose 65+) 01/14/2019  . Influenza Whole 01/10/2013  . Influenza, High Dose Seasonal PF 01/24/2015, 02/18/2016, 02/09/2017, 02/02/2018  . Influenza, Seasonal, Injecte, Preservative Fre 07/11/2014  . Pneumococcal Conjugate-13 11/03/2014  . Pneumococcal Polysaccharide-23 10/13/2012, 02/02/2018  . Td 10/13/2012  . Zoster 10/23/2012   Screening Tests Health Maintenance  Topic Date Due  . TETANUS/TDAP  10/14/2022  . INFLUENZA VACCINE  Completed  . DEXA SCAN  Completed  . PNA vac Low Risk Adult  Completed      Plan:   Keep all routine maintenance appointments.    Follow up 07/06/19 @ 9:00  Medicare Attestation I have personally reviewed: The patient's medical and social history Their use of alcohol, tobacco or illicit drugs Their current medications and supplements The patient's functional ability including ADLs,fall risks, home safety risks, cognitive, and hearing and visual impairment Diet and physical activities Evidence for depression   In addition, I have reviewed and discussed with patient certain preventive protocols, quality metrics, and best practice recommendations. A written personalized care plan for preventive services as well as general preventive health recommendations were provided to patient via mail.     Varney Biles, LPN  [phone]

## 2019-05-16 ENCOUNTER — Other Ambulatory Visit: Payer: Self-pay | Admitting: Internal Medicine

## 2019-05-16 DIAGNOSIS — Z1231 Encounter for screening mammogram for malignant neoplasm of breast: Secondary | ICD-10-CM

## 2019-05-20 ENCOUNTER — Telehealth: Payer: Self-pay | Admitting: Internal Medicine

## 2019-05-20 NOTE — Telephone Encounter (Signed)
Pt called about needing a release form regarding being on blood thinners. Pt was told that at the [facility] in order to get the covid vaccine. Please advise and Thank you!  Call pt @ [phone].

## 2019-05-23 NOTE — Telephone Encounter (Signed)
She is being Rx blood thinners by cardiology  I would call them   [place]

## 2019-05-23 NOTE — Telephone Encounter (Signed)
Pt.notified

## 2019-05-27 ENCOUNTER — Other Ambulatory Visit: Payer: Self-pay

## 2019-05-27 ENCOUNTER — Encounter: Payer: Self-pay | Admitting: Cardiovascular Disease

## 2019-05-27 ENCOUNTER — Ambulatory Visit (INDEPENDENT_AMBULATORY_CARE_PROVIDER_SITE_OTHER): Payer: Medicare Other | Admitting: Cardiovascular Disease

## 2019-05-27 VITALS — BP 120/80 | HR 90 | Ht 62.0 in | Wt 151.5 lb

## 2019-05-27 DIAGNOSIS — E785 Hyperlipidemia, unspecified: Secondary | ICD-10-CM

## 2019-05-27 DIAGNOSIS — I10 Essential (primary) hypertension: Secondary | ICD-10-CM

## 2019-05-27 DIAGNOSIS — I739 Peripheral vascular disease, unspecified: Secondary | ICD-10-CM

## 2019-05-27 DIAGNOSIS — I4891 Unspecified atrial fibrillation: Secondary | ICD-10-CM | POA: Diagnosis not present

## 2019-05-27 DIAGNOSIS — I779 Disorder of arteries and arterioles, unspecified: Secondary | ICD-10-CM

## 2019-05-27 MED ORDER — APIXABAN 5 MG PO TABS: 5.0000 mg | ORAL_TABLET | Freq: Two times a day (BID) | ORAL | 1 refills | Status: DC

## 2019-05-27 NOTE — Addendum Note (Signed)
Addended by: Harrington Challenger on: 05/27/2019 12:55 PM   Modules accepted: Orders

## 2019-05-27 NOTE — Patient Instructions (Signed)
Medication Instructions:  Your physician recommends that you continue on your current medications as directed. Please refer to the Current Medication list given to you today.  *If you need a refill on your cardiac medications before your next appointment, please call your pharmacy*  Lab Work: None ordered If you have labs (blood work) drawn today and your tests are completely normal, you will receive your results only by: Marland Kitchen MyChart Message (if you have MyChart) OR . A paper copy in the mail If you have any lab test that is abnormal or we need to change your treatment, we will call you to review the results.  Testing/Procedures: None ordered  Follow-Up: At [facility], you and your health needs are our priority.  As part of our continuing mission to provide you with exceptional heart care, we have created designated Provider Care Teams.  These Care Teams include your primary Cardiologist (physician) and Advanced Practice Providers (APPs -  Physician Assistants and Nurse Practitioners) who all work together to provide you with the care you need, when you need it.  Your next appointment:   6 month(s)  The format for your next appointment:   In Person  Provider:    You may see Dr. Fletcher Anon or one of the following Advanced Practice Providers on your designated Care Team:    Murray Hodgkins, NP  Christell Faith, PA-C  Marrianne Mood, PA-C   Other Instructions N/A

## 2019-05-27 NOTE — Telephone Encounter (Signed)
Patient has appointment with Dr. Fletcher Anon today in [place] office because it is first available but will return to [place] office for F/U.  *STAT* If patient is at the pharmacy, call can be transferred to refill team.   1. Which medications need to be refilled? (please list name of each medication and dose if known) Eliquis  2. Which pharmacy/location (including street and city if local pharmacy) is medication to be sent to? [facility]  3. Do they need a 30 day or 90 day supply? [facility]

## 2019-05-27 NOTE — Progress Notes (Signed)
Cardiology Office Note   Date:  05/27/2019   ID:  [name], DOB Oct 31, 1940, MRN [id number]  PCP:  McLean-Scocuzza, Nino Glow, MD  Cardiologist: Dr. Irish Lack  Chief Complaint  Patient presents with  . office visit    6 mo F/U; Meds verbally reviewed with patient.      History of Present Illness: [name] is a 79 y.o. female who is here today for follow-up visit regarding peripheral arterial disease.   She has known history of atrial fibrillation, hyperlipidemia, hypertension and carotid disease.  She is not a smoker and has no family history of coronary artery disease.  She is not diabetic. She is status post bilateral carotid artery stenting in 2014. She was seen in 2019 for bilateral calf claudication as well as arm discomfort with exertion. She had carotid Doppler done in November which showed less than 50% stenosis bilaterally.  There was antegrade flow in both vertebral arteries and no significant disease in the subclavian arteries. She had noninvasive vascular evaluation done which showed an ABI of 0.88 on the right and 0.91 on the left but difficult to interpret this given that pressure in the arms was abnormal.  Duplex showed significant right common femoral artery disease with moderate SFA disease and occluded popliteal artery.  On the left, there was severe disease affecting the left distal SFA.  Duplex of the upper extremities showed severe subclavian disease bilaterally worse on the left side.  Aortic arch and lower extremity angiography was performed in February 2020. There was moderate right subclavian artery stenosis at the origin of the [place].  There was significant stenosis affecting the left proximal axillary artery.  In the right lower extremity, there was significant calcified common femoral artery disease extending into the ostium of the SFA with occluded popliteal artery and two-vessel runoff below the knee.  On the left, there was borderline  significant common iliac artery stenosis with diffuse moderate disease affecting the entire left SFA with severe focal stenosis distally and three-vessel runoff below the knee.  Given that the patient's symptoms were not lifestyle limiting, I advised her to start an exercise program before considering revascularization.  She tries to walk every day and usually tries to do 2 miles at least 3 times a week.  It takes her about 1 hour to finish 2 miles.  Overall she is feeling significantly better than before.  No chest pain, shortness of breath or palpitations.   Past Medical History:  Diagnosis Date  . Arthritis    hands, knees, ankles  . BCC (basal cell carcinoma of skin) 2016   Stinehelfer  . Bilateral carotid artery stenosis    a. s/p right carotid stent after restenosis post CEA, left carotid stent (previously on Plavix for these). b. Carotid US 03/2016: stable 40-59% BICA.  Marland Kitchen Fibrocystic breast disease    followed by surgery Jamal Collin)  . GERD (gastroesophageal reflux disease)    OTC prilosec  . Hernia 2005  . History of chicken pox   . History of colon polyps   . History of melanoma 1990s   right shoulder  . HLD (hyperlipidemia)   . HTN (hypertension) 1970s  . Hypertensive retinopathy of both eyes, grade 1 2017   Bulakowski  . Kidney cyst, acquired 2016   large R septated 8cm, stable 2017  . Melanoma in situ ([place]) 2017   R medial posterior arm - lentio maligna type, margin involved ([place])  . PAF (paroxysmal atrial fibrillation) ([place])  a. dx 12/2015 at routine physical.    Past Surgical History:  Procedure Laterality Date  . ABDOMINAL AORTOGRAM W/LOWER EXTREMITY N/A 06/23/2018   Procedure: ABDOMINAL AORTOGRAM W/LOWER EXTREMITY;  Surgeon: Wellington Hampshire, MD;  Location: [facility];  Service: Cardiovascular;  Laterality: N/A;  . ABI  2007   R 1.19, L 1.2  . AORTIC ARCH ANGIOGRAPHY N/A 06/23/2018   Procedure: AORTIC ARCH ANGIOGRAPHY;  Surgeon: Wellington Hampshire, MD;  Location: [facility];  Service: Cardiovascular;  Laterality: N/A;  . APPENDECTOMY  1995  . BREAST BIOPSY Left 1997   left  . BREAST CYST ASPIRATION Left 02/1997   fna  . BREAST EXCISIONAL BIOPSY Right 1998   benign per pt  . CARDIOVASCULAR STRESS TEST  ~2003   WNL  . CAROTID ENDARTERECTOMY  2002   bilateral  . CAROTID STENT Right 2002   right after restenosis post CEA (Dr. Edd Arbour)  . CAROTID STENT  2014   for stenosis  . CAROTID STENT Left 2014   Dr. Ardeth Sportsman, [place]  . CESAREAN SECTION  1968; 1972  . COLONOSCOPY  03/2012   Sankar - polyp (rec rpt 5 yrs)  . EXPLORATORY LAPAROTOMY  1990s   for GI pain, inflamed lower GI, unrevealing workup, improved with abx  . HERNIA REPAIR  2005   abdomen  . MELANOMA EXCISION  1998   right shoulder  . TOTAL HIP ARTHROPLASTY  2005   bilat     Current Outpatient Medications  Medication Sig Dispense Refill  . acetaminophen (TYLENOL) 500 MG tablet Take 500 mg by mouth daily.     Marland Kitchen apixaban (ELIQUIS) 5 MG TABS tablet Take 1 tablet (5 mg total) by mouth 2 (two) times daily. 180 tablet 1  . atorvastatin (LIPITOR) 40 MG tablet Take 1 tablet (40 mg total) by mouth at bedtime. 90 tablet 3  . Cholecalciferol (VITAMIN D-3) 25 MCG (1000 UT) CAPS Take by mouth.    . diltiazem (CARDIZEM CD) 120 MG 24 hr capsule Take 1 capsule (120 mg total) by mouth daily. 90 capsule 3  . enalapril (VASOTEC) 5 MG tablet Take 1 tablet (5 mg total) by mouth daily. 90 tablet 3  . fluticasone (FLONASE) 50 MCG/ACT nasal spray Place 2 sprays into both nostrils daily as needed for allergies or rhinitis.    . metoprolol succinate (TOPROL-XL) 100 MG 24 hr tablet Take 1 tablet (100 mg total) by mouth daily. Take with or immediately following a meal. 90 tablet 3  . Multiple Vitamin (MULTIVITAMIN WITH MINERALS) TABS tablet Take 1 tablet by mouth daily.    Marland Kitchen triamcinolone cream (KENALOG) 0.1 % Apply 1 application topically daily as needed (skin cancer).    .  nitrofurantoin, macrocrystal-monohydrate, (MACROBID) 100 MG capsule Take 1 capsule (100 mg total) by mouth 2 (two) times daily. With food (Patient not taking: Reported on 05/27/2019) 14 capsule 0   No current facility-administered medications for this visit.    Allergies:   Patient has no known allergies.    Social History:  The patient  reports that she has quit smoking. She has a 30.00 pack-year smoking history. She has never used smokeless tobacco. She reports current alcohol use. She reports that she does not use drugs.   Family History:  The patient's family history includes Cancer (age of onset: 40) in her father; Cancer (age of onset: 64) in her mother; Hypertension in her paternal grandfather.    ROS:  Please see the history  of present illness.   Otherwise, review of systems are positive for none.   All other systems are reviewed and negative.    PHYSICAL EXAM: VS:  BP 120/80 (BP Location: Left Arm, Patient Position: Sitting, Cuff Size: Normal)   Pulse 90   Ht 5\' 2"  (1.575 m)   Wt 151 lb 8 oz (68.7 kg)   SpO2 94%   BMI 27.71 kg/m  , BMI Body mass index is 27.71 kg/m. GEN: Well nourished, well developed, in no acute distress  HEENT: normal  Neck: no JVD, carotid bruits, or masses Cardiac: Irregularly irregular; no murmurs, rubs, or gallops,no edema  Respiratory:  clear to auscultation bilaterally, normal work of breathing GI: soft, nontender, nondistended, + BS MS: no deformity or atrophy  Skin: warm and dry, no rash Neuro:  Strength and sensation are intact Psych: euthymic mood, full affect Vascular: Radial pulses +1 bilaterally.  Femoral pulses mildly diminished bilaterally.  Distal pulses are not palpable.   EKG:  EKG is ordered today. EKG showed atrial fibrillation with ventricular rate of 90 bpm.   Recent Labs: 02/17/2019: ALT 25; BUN 21; Creatinine, Ser 1.03; Hemoglobin 15.8; Platelets 242.0; Potassium 4.5; Sodium 139; TSH 2.53    Lipid Panel    Component  Value Date/Time   CHOL 136 02/17/2019 0803   TRIG 97.0 02/17/2019 0803   TRIG 144 03/28/2011 0000   HDL 41.00 02/17/2019 0803   CHOLHDL 3 02/17/2019 0803   VLDL 19.4 02/17/2019 0803   LDLCALC 75 02/17/2019 0803   LDLCALC 76 02/16/2018 0924   LDLDIRECT 78 03/28/2011 0000      Wt Readings from Last 3 Encounters:  05/27/19 151 lb 8 oz (68.7 kg)  03/09/19 145 lb (65.8 kg)  12/30/18 144 lb (65.3 kg)       No flowsheet data found.    ASSESSMENT AND PLAN:  1.  Peripheral arterial disease: Moderate bilateral calf claudication worse on the left side.   The patient has diffuse disease as outlined above.  She reports significant improvement in symptoms with a walking program.  Recommend continuing medical therapy. The disease on the right lower extremity requires right femoral endarterectomy into the ostium of the SFA or atherectomy as a second choice.  On the left side, the whole SFA will need to be treated.  2.  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 she does not have significant arm claudication and has no symptoms suggestive of steal syndrome.  Continue medical therapy.  3.  Carotid disease: Status post bilateral carotid artery stenting in 2014.  Carotid Doppler in 2019 showed  less than 50% stenosis bilaterally.  The plan is to repeat carotid Doppler in the second half of 2021.  4.  Atrial fibrillation: Ventricular rate is controlled and she is tolerating anticoagulation.  5.  Hyperlipidemia: Continue treatment with atorvastatin with a target LDL of less than 70.  Most recent lipid profile showed an LDL of 75 which is close to target.  We could consider switching to high-dose rosuvastatin.    Disposition:   FU with me in 6 months  Signed,  Kathlyn Sacramento, MD  05/27/2019 11:01 AM    [place]

## 2019-06-07 ENCOUNTER — Ambulatory Visit: Payer: Medicare Other

## 2019-06-10 ENCOUNTER — Ambulatory Visit
Admission: RE | Admit: 2019-06-10 | Discharge: 2019-06-10 | Disposition: A | Discharge status: Home / Self Care | Payer: Medicare Other | Source: Ambulatory Visit | Attending: Internal Medicine | Admitting: Internal Medicine

## 2019-06-10 DIAGNOSIS — Z1231 Encounter for screening mammogram for malignant neoplasm of breast: Secondary | ICD-10-CM | POA: Diagnosis not present

## 2019-06-16 ENCOUNTER — Ambulatory Visit: Payer: Medicare Other | Attending: Internal Medicine

## 2019-06-16 DIAGNOSIS — Z23 Encounter for immunization: Secondary | ICD-10-CM | POA: Insufficient documentation

## 2019-06-16 NOTE — Progress Notes (Signed)
   Covid-19 Vaccination Clinic  Name:  [name]    MRN: [id number] DOB: 11-27-1940  06/16/2019  [name] was observed post Covid-19 immunization for 15 minutes without incidence. She was provided with Vaccine Information Sheet and instruction to access the V-Safe system.   [name] was instructed to call 911 with any severe reactions post vaccine: Marland Kitchen Difficulty breathing  . Swelling of your face and throat  . A fast heartbeat  . A bad rash all over your body  . Dizziness and weakness    Immunizations Administered    Name Date Dose VIS Date Route   Pfizer COVID-19 Vaccine 06/16/2019  3:34 PM 0.3 mL 04/22/2019 Intramuscular   Manufacturer: [place]   Lot: [id number]   [place]: [id number]

## 2019-07-06 ENCOUNTER — Encounter: Payer: Self-pay | Admitting: Internal Medicine

## 2019-07-06 ENCOUNTER — Telehealth: Payer: Self-pay | Admitting: Internal Medicine

## 2019-07-06 ENCOUNTER — Other Ambulatory Visit: Payer: Self-pay

## 2019-07-06 ENCOUNTER — Ambulatory Visit (INDEPENDENT_AMBULATORY_CARE_PROVIDER_SITE_OTHER): Payer: Medicare Other | Admitting: Internal Medicine

## 2019-07-06 VITALS — BP 127/79 | Ht 62.0 in | Wt 148.0 lb

## 2019-07-06 DIAGNOSIS — E785 Hyperlipidemia, unspecified: Secondary | ICD-10-CM | POA: Diagnosis not present

## 2019-07-06 DIAGNOSIS — Z23 Encounter for immunization: Secondary | ICD-10-CM

## 2019-07-06 DIAGNOSIS — N3000 Acute cystitis without hematuria: Secondary | ICD-10-CM | POA: Diagnosis not present

## 2019-07-06 DIAGNOSIS — R7303 Prediabetes: Secondary | ICD-10-CM | POA: Diagnosis not present

## 2019-07-06 DIAGNOSIS — D751 Secondary polycythemia: Secondary | ICD-10-CM | POA: Diagnosis not present

## 2019-07-06 MED ORDER — SHINGRIX 50 MCG/0.5ML IM SUSR: 0.5000 mL | Freq: Once | INTRAMUSCULAR | 0 refills | Status: DC

## 2019-07-06 MED ORDER — SHINGRIX 50 MCG/0.5ML IM SUSR: 0.5000 mL | Freq: Once | INTRAMUSCULAR | 0 refills | Status: AC

## 2019-07-06 NOTE — Progress Notes (Signed)
Virtual Visit via Video Note  I connected with [name]  on 07/06/19 at  9:15 AM EST by a video enabled telemedicine application and verified that I am speaking with the correct person using two identifiers.  Location patient: home Location provider:work or home office Persons participating in the virtual visit: patient, provider  I discussed the limitations of evaluation and management by telemedicine and the availability of in person appointments. The patient expressed understanding and agreed to proceed.   HPI: 1. 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 She is not sure if she snores as she lives alone elevated EPo in the past but CT ab/pelvis no concerning renal mass likely benign, GS, 4.1 cm adnexal mass increased in size since 2016 but likely benign. She is drinking 5-6 bottles of water daily   2. Doing well wants shingrix vaccine  ROS: See pertinent positives and negatives per HPI.  Past Medical History:  Diagnosis Date  . Arthritis    hands, knees, ankles  . BCC (basal cell carcinoma of skin) 2016   Stinehelfer  . Bilateral carotid artery stenosis    a. s/p right carotid stent after restenosis post CEA, left carotid stent (previously on Plavix for these). b. Carotid US 03/2016: stable 40-59% BICA.  Marland Kitchen Fibrocystic breast disease    followed by surgery Jamal Collin)  . GERD (gastroesophageal reflux disease)    OTC prilosec  . Hernia 2005  . History of chicken pox   . History of colon polyps   . History of melanoma 1990s   right shoulder  . HLD (hyperlipidemia)   . HTN (hypertension) 1970s  . Hypertensive retinopathy of both eyes, grade 1 2017   Bulakowski  . Kidney cyst, acquired 2016   large R septated 8cm, stable 2017  . Melanoma in situ ([place]) 2017   R medial posterior arm - lentio maligna type, margin involved ([place])  . PAF (paroxysmal atrial fibrillation) ([place])    a. dx 12/2015 at routine  physical.    Past Surgical History:  Procedure Laterality Date  . ABDOMINAL AORTOGRAM W/LOWER EXTREMITY N/A 06/23/2018   Procedure: ABDOMINAL AORTOGRAM W/LOWER EXTREMITY;  Surgeon: Wellington Hampshire, MD;  Location: [facility];  Service: Cardiovascular;  Laterality: N/A;  . ABI  2007   R 1.19, L 1.2  . AORTIC ARCH ANGIOGRAPHY N/A 06/23/2018   Procedure: AORTIC ARCH ANGIOGRAPHY;  Surgeon: Wellington Hampshire, MD;  Location: [facility];  Service: Cardiovascular;  Laterality: N/A;  . APPENDECTOMY  1995  . BREAST BIOPSY Left 1997   left  . BREAST CYST ASPIRATION Left 02/1997   fna  . BREAST EXCISIONAL BIOPSY Right 1998   benign per pt  . CARDIOVASCULAR STRESS TEST  ~2003   WNL  . CAROTID ENDARTERECTOMY  2002   bilateral  . CAROTID STENT Right 2002   right after restenosis post CEA (Dr. Edd Arbour)  . CAROTID STENT  2014   for stenosis  . CAROTID STENT Left 2014   Dr. Ardeth Sportsman, [place]  . CESAREAN SECTION  1968; 1972  . COLONOSCOPY  03/2012   Sankar - polyp (rec rpt 5 yrs)  . EXPLORATORY LAPAROTOMY  1990s   for GI pain, inflamed lower GI, unrevealing workup, improved with abx  . HERNIA REPAIR  2005   abdomen  . MELANOMA EXCISION  1998   right shoulder  . TOTAL HIP ARTHROPLASTY  2005   bilat  Family History  Problem Relation Age of Onset  . Cancer Mother 80       lung, smoker  . Cancer Father 61       prostate  . Hypertension Paternal Grandfather   . CAD Neg Hx   . Stroke Neg Hx   . Diabetes Neg Hx   . Heart attack Neg Hx   . Breast cancer Neg Hx     SOCIAL HX:  Caffeine: 3 cups coffee/day Widow of husband Jori Moll) died in Aug 23, 2013 cancer, lives with 1 dog.  Grown children Occupation: homemaker, used to be Catering manager Edu: college Activity: plays golf, walks dog, swims Diet: good water, fruits/vegetables daily Former smoker age 26 to 1958/9 1 pk lasted 1 week    Current Outpatient Medications:  .  Acetaminophen (TYLENOL ARTHRITIS PAIN PO), Take 1 tablet  by mouth daily., Disp: , Rfl:  .  acetaminophen (TYLENOL) 500 MG tablet, Take 500 mg by mouth daily. , Disp: , Rfl:  .  apixaban (ELIQUIS) 5 MG TABS tablet, Take 1 tablet (5 mg total) by mouth 2 (two) times daily., Disp: 180 tablet, Rfl: 1 .  atorvastatin (LIPITOR) 40 MG tablet, Take 1 tablet (40 mg total) by mouth at bedtime., Disp: 90 tablet, Rfl: 3 .  Cholecalciferol (VITAMIN D-3) 25 MCG (1000 UT) CAPS, Take by mouth., Disp: , Rfl:  .  diltiazem (CARDIZEM CD) 120 MG 24 hr capsule, Take 1 capsule (120 mg total) by mouth daily., Disp: 90 capsule, Rfl: 3 .  enalapril (VASOTEC) 5 MG tablet, Take 1 tablet (5 mg total) by mouth daily., Disp: 90 tablet, Rfl: 3 .  fluticasone (FLONASE) 50 MCG/ACT nasal spray, Place 2 sprays into both nostrils daily as needed for allergies or rhinitis., Disp: , Rfl:  .  metoprolol succinate (TOPROL-XL) 100 MG 24 hr tablet, Take 1 tablet (100 mg total) by mouth daily. Take with or immediately following a meal., Disp: 90 tablet, Rfl: 3 .  Multiple Vitamin (MULTIVITAMIN WITH MINERALS) TABS tablet, Take 1 tablet by mouth daily., Disp: , Rfl:  .  triamcinolone cream (KENALOG) 0.1 %, Apply 1 application topically daily as needed (skin cancer)., Disp: , Rfl:  .  Zoster Vaccine Adjuvanted [facility]) injection, Inject 0.5 mLs into the muscle once for 1 dose., Disp: 0.5 mL, Rfl: 0 .  Zoster Vaccine Adjuvanted (SHINGRIX) injection, Inject 0.5 mLs into the muscle once for 1 dose., Disp: 0.5 mL, Rfl: 0  EXAM:  VITALS per patient if applicable:  GENERAL: alert, oriented, appears well and in no acute distress  HEENT: atraumatic, conjunttiva clear, no obvious abnormalities on inspection of external nose and ears  NECK: normal movements of the head and neck  LUNGS: on inspection no signs of respiratory distress, breathing rate appears normal, no obvious gross SOB, gasping or wheezing  CV: no obvious cyanosis  MS: moves all visible extremities without noticeable  abnormality  PSYCH/NEURO: pleasant and cooperative, no obvious depression or anxiety, speech and thought processing grossly intact  ASSESSMENT AND PLAN:  Discussed the following assessment and plan:  Polycythemia - Plan: CBC with Differential/Platelet, Erythropoietin Consider repeat echo with h/o pulm HTN will CC cardiology  Consider sleep study  Consider h/o in future   Acute cystitis without hematuria - Plan: Urinalysis, Routine w reflex microscopic, Urine Culture  Prediabetes - Plan: Lipid panel, Comprehensive metabolic panel, Hemoglobin A1c  Hyperlipidemia, unspecified hyperlipidemia type - Plan: Lipid panel  HM Fasting labs sch after 07/18/19  Had flu shot 02/02/18 will get [facility]  [facility] T[facility] pt had 10/13/12 consider in 2024 unclear if had Td vs Tdap Given rx shingrix vaccinehas not had yetas of 12/30/18 pt will check with insurance -sent to pharmacy  pna 23 utd and prevnarutd covid vx 1/2 had 2nd dose 07/11/19  Check fasting labs upcoming   mammo  06/10/19 negative  dEXa1/27/20osteopenia vitamin D 1000 Mg qd  Colonoscopy 03/03/12 Sankar polyps x 2 hyperplastic f/u with Dr. Bary Castilla likely no other needed  Pap records [facility] -4/28/14neg pap neg HPV Dr. Laurey Morale [facility]. atrophy+ Former smoker age 19 to 46/9 1 pk lasted 1 week no FH lung cancer  Dermatology [place] derm saw 03/2018 had right forearm lesion and will f/u 01/2019 h/o skin cancer MM and NMSC -appt pending 2021  Ortho Dr. Maureen Ralphs needs appt b/l hip Xray s/p replacement  Dental appt upcoming as of 07/06/19   -we discussed possible serious and likely etiologies, options for evaluation and workup, limitations of telemedicine visit vs in person visit, treatment, treatment risks and precautions. Pt prefers to treat via telemedicine empirically rather then risking or undertaking an in person visit at this moment. Patient agrees to seek prompt in person care if worsening, new symptoms arise, or if is not improving with  treatment.   I discussed the assessment and treatment plan with the patient. The patient was provided an opportunity to ask questions and all were answered. The patient agreed with the plan and demonstrated an understanding of the instructions.   The patient was advised to call back or seek an in-person evaluation if the symptoms worsen or if the condition fails to improve as anticipated.  Time spent 20 minutes  Delorise Jackson, MD

## 2019-07-06 NOTE — Telephone Encounter (Signed)
Lm on vm to call office and schedule fasting labs after 07/18/2019 and schedule a follow up between 3-6 months.

## 2019-07-11 ENCOUNTER — Ambulatory Visit: Payer: Medicare Other | Attending: Internal Medicine

## 2019-07-11 DIAGNOSIS — Z23 Encounter for immunization: Secondary | ICD-10-CM

## 2019-07-11 NOTE — Progress Notes (Signed)
   Covid-19 Vaccination Clinic  Name:  [name]    MRN: [id number] DOB: 10-05-1940  07/11/2019  [name] was observed post Covid-19 immunization for 15 minutes without incidence. She was provided with Vaccine Information Sheet and instruction to access the V-Safe system.   [name] was instructed to call 911 with any severe reactions post vaccine: Marland Kitchen Difficulty breathing  . Swelling of your face and throat  . A fast heartbeat  . A bad rash all over your body  . Dizziness and weakness    Immunizations Administered    Name Date Dose VIS Date Route   Pfizer COVID-19 Vaccine 07/11/2019  2:11 PM 0.3 mL 04/22/2019 Intramuscular   Manufacturer: [place]   Lot: [id number]   [place]: [id number]

## 2019-07-18 ENCOUNTER — Encounter: Payer: Self-pay | Admitting: Internal Medicine

## 2019-07-18 NOTE — Progress Notes (Signed)
RX printed and faxed to patient's pharmacy. Fax confirmation printed, fax went through.

## 2019-07-21 ENCOUNTER — Other Ambulatory Visit (INDEPENDENT_AMBULATORY_CARE_PROVIDER_SITE_OTHER): Payer: Medicare Other

## 2019-07-21 ENCOUNTER — Other Ambulatory Visit: Payer: Self-pay

## 2019-07-21 DIAGNOSIS — R7303 Prediabetes: Secondary | ICD-10-CM

## 2019-07-21 DIAGNOSIS — N3000 Acute cystitis without hematuria: Secondary | ICD-10-CM | POA: Diagnosis not present

## 2019-07-21 DIAGNOSIS — E785 Hyperlipidemia, unspecified: Secondary | ICD-10-CM

## 2019-07-21 DIAGNOSIS — D751 Secondary polycythemia: Secondary | ICD-10-CM | POA: Diagnosis not present

## 2019-07-21 LAB — CBC WITH DIFFERENTIAL/PLATELET
Basophils Absolute: 0 10*3/uL (ref 0.0–0.1)
Basophils Relative: 0.5 % (ref 0.0–3.0)
Eosinophils Absolute: 0.1 10*3/uL (ref 0.0–0.7)
Eosinophils Relative: 2.2 % (ref 0.0–5.0)
HCT: 44.3 % (ref 36.0–46.0)
Hemoglobin: 14.8 g/dL (ref 12.0–15.0)
Lymphocytes Relative: 31.4 % (ref 12.0–46.0)
Lymphs Abs: 1.7 10*3/uL (ref 0.7–4.0)
MCHC: 33.3 g/dL (ref 30.0–36.0)
MCV: 99.3 fl (ref 78.0–100.0)
Monocytes Absolute: 0.6 10*3/uL (ref 0.1–1.0)
Monocytes Relative: 10.1 % (ref 3.0–12.0)
Neutro Abs: 3.1 10*3/uL (ref 1.4–7.7)
Neutrophils Relative %: 55.8 % (ref 43.0–77.0)
Platelets: 170 10*3/uL (ref 150.0–400.0)
RBC: 4.46 Mil/uL (ref 3.87–5.11)
RDW: 14.3 % (ref 11.5–15.5)
WBC: 5.5 10*3/uL (ref 4.0–10.5)

## 2019-07-21 LAB — COMPREHENSIVE METABOLIC PANEL
ALT: 13 U/L (ref 0–35)
AST: 12 U/L (ref 0–37)
Albumin: 3.9 g/dL (ref 3.5–5.2)
Alkaline Phosphatase: 107 U/L (ref 39–117)
BUN: 20 mg/dL (ref 6–23)
CO2: 29 mEq/L (ref 19–32)
Calcium: 9.2 mg/dL (ref 8.4–10.5)
Chloride: 104 mEq/L (ref 96–112)
Creatinine, Ser: 1.06 mg/dL (ref 0.40–1.20)
GFR: 49.79 mL/min — ABNORMAL LOW (ref >60.00)
Glucose, Bld: 100 mg/dL — ABNORMAL HIGH (ref 70–99)
Potassium: 4.4 mEq/L (ref 3.5–5.1)
Sodium: 139 mEq/L (ref 135–145)
Total Bilirubin: 0.7 mg/dL (ref 0.2–1.2)
Total Protein: 7.4 g/dL (ref 6.0–8.3)

## 2019-07-21 LAB — LIPID PANEL
Cholesterol: 120 mg/dL (ref 0–200)
HDL: 39.2 mg/dL (ref >39.00)
LDL Cholesterol: 62 mg/dL (ref 0–99)
NonHDL: 80.42
Total CHOL/HDL Ratio: 3
Triglycerides: 90 mg/dL (ref 0.0–149.0)
VLDL: 18 mg/dL (ref 0.0–40.0)

## 2019-07-21 LAB — HEMOGLOBIN A1C: Hgb A1c MFr Bld: 5.9 % (ref 4.6–6.5)

## 2019-07-23 LAB — URINALYSIS, ROUTINE W REFLEX MICROSCOPIC
Bilirubin Urine: NEGATIVE
Glucose, UA: NEGATIVE
Hyaline Cast: NONE SEEN /LPF
Ketones, ur: NEGATIVE
Nitrite: POSITIVE — AB
Specific Gravity, Urine: 1.02 (ref 1.001–1.03)
Squamous Epithelial / HPF: NONE SEEN /HPF (ref <=5)
WBC, UA: >=60 /HPF — AB (ref 0–5)
pH: 5.5 (ref 5.0–8.0)

## 2019-07-23 LAB — URINE CULTURE
MICRO NUMBER:: 10240602
SPECIMEN QUALITY:: ADEQUATE

## 2019-07-25 ENCOUNTER — Other Ambulatory Visit: Payer: Self-pay | Admitting: Internal Medicine

## 2019-07-25 DIAGNOSIS — B9620 Unspecified Escherichia coli [E. coli] as the cause of diseases classified elsewhere: Secondary | ICD-10-CM

## 2019-07-25 LAB — ERYTHROPOIETIN: Erythropoietin: 32.4 m[IU]/mL — ABNORMAL HIGH (ref 2.6–18.5)

## 2019-07-25 MED ORDER — CIPROFLOXACIN HCL 500 MG PO TABS: 500.0000 mg | ORAL_TABLET | Freq: Two times a day (BID) | ORAL | 0 refills | Status: DC

## 2019-07-27 DIAGNOSIS — Z85820 Personal history of malignant melanoma of skin: Secondary | ICD-10-CM | POA: Diagnosis not present

## 2019-07-27 DIAGNOSIS — L570 Actinic keratosis: Secondary | ICD-10-CM | POA: Diagnosis not present

## 2019-07-27 DIAGNOSIS — Z85828 Personal history of other malignant neoplasm of skin: Secondary | ICD-10-CM | POA: Diagnosis not present

## 2019-07-27 DIAGNOSIS — D225 Melanocytic nevi of trunk: Secondary | ICD-10-CM | POA: Diagnosis not present

## 2019-07-27 DIAGNOSIS — L820 Inflamed seborrheic keratosis: Secondary | ICD-10-CM | POA: Diagnosis not present

## 2019-09-09 DIAGNOSIS — Z96641 Presence of right artificial hip joint: Secondary | ICD-10-CM | POA: Diagnosis not present

## 2019-09-09 DIAGNOSIS — M545 Low back pain: Secondary | ICD-10-CM | POA: Diagnosis not present

## 2019-09-09 DIAGNOSIS — Z96642 Presence of left artificial hip joint: Secondary | ICD-10-CM | POA: Diagnosis not present

## 2019-09-09 DIAGNOSIS — Z96643 Presence of artificial hip joint, bilateral: Secondary | ICD-10-CM | POA: Diagnosis not present

## 2019-09-09 DIAGNOSIS — Z471 Aftercare following joint replacement surgery: Secondary | ICD-10-CM | POA: Diagnosis not present

## 2019-10-06 ENCOUNTER — Ambulatory Visit: Payer: Medicare Other | Admitting: Internal Medicine

## 2019-10-06 NOTE — Progress Notes (Signed)
Cardiology Office Note   Date:  10/07/2019   ID:  [name], DOB 02-Sep-1940, MRN [id number]  PCP:  McLean-Scocuzza, Nino Glow, MD    No chief complaint on file.  PAD/carotid artery disease  Wt Readings from Last 3 Encounters:  10/07/19 150 lb 12.8 oz (68.4 kg)  07/06/19 148 lb (67.1 kg)  05/27/19 151 lb 8 oz (68.7 kg)       History of Present Illness: [name] is a 79 y.o. female  who has had a right carotid stent after restenosis post CEA. She saw Dr. Edd Arbour at [facility], now at [facility]. She had been following with him for a while but came to me for local care. SHe has not had heart probelms. She has had a stress test many years ago which was negative. She had a left carotid stent done in 06/05/2022.  Her husband passed away in 02/05/2023. Daughter lives in [place], and sees my sister in law for a PMD.   She was diagnosed with AFib earlier in 2017. She was asymptomatic. She hadno bleeding problems after starting Eliquis.  She was doing [employer] of [employer].  She has a cyst on her kidneys.  THis is being followed by CT.   She is also followed by Dr. Fletcher Anon for peripheral arterial disease.  This affects both her lower extremities and her upper extremities.  She has been trying to walk more.  No nonhealing sores.  She plays golf occasionally- 9 holes at a time.   She had her COVID vaccines.   Past Medical History:  Diagnosis Date  . Arthritis    hands, knees, ankles  . BCC (basal cell carcinoma of skin) 2016   Stinehelfer  . Bilateral carotid artery stenosis    a. s/p right carotid stent after restenosis post CEA, left carotid stent (previously on Plavix for these). b. Carotid US 03/2016: stable 40-59% BICA.  Marland Kitchen Fibrocystic breast disease    followed by surgery Jamal Collin)  . GERD (gastroesophageal reflux disease)    OTC prilosec  . Hernia 2005  . History of chicken pox   . History of colon polyps   . History of melanoma 1990s   right shoulder  . HLD (hyperlipidemia)    . HTN (hypertension) 1970s  . Hypertensive retinopathy of both eyes, grade 1 2017   Bulakowski  . Kidney cyst, acquired 2016   large R septated 8cm, stable 2017  . Melanoma in situ ([place]) 2017   R medial posterior arm - lentio maligna type, margin involved ([place])  . PAF (paroxysmal atrial fibrillation) ([place])    a. dx 12/2015 at routine physical.    Past Surgical History:  Procedure Laterality Date  . ABDOMINAL AORTOGRAM W/LOWER EXTREMITY N/A 06/23/2018   Procedure: ABDOMINAL AORTOGRAM W/LOWER EXTREMITY;  Surgeon: Wellington Hampshire, MD;  Location: [place] [facility];  Service: Cardiovascular;  Laterality: N/A;  . ABI  2007   R 1.19, L 1.2  . AORTIC ARCH ANGIOGRAPHY N/A 06/23/2018   Procedure: AORTIC ARCH ANGIOGRAPHY;  Surgeon: Wellington Hampshire, MD;  Location: [place] [facility];  Service: Cardiovascular;  Laterality: N/A;  . APPENDECTOMY  1995  . BREAST BIOPSY Left 1997   left  . BREAST CYST ASPIRATION Left 02/1997   fna  . BREAST EXCISIONAL BIOPSY Right 1998   benign per pt  . CARDIOVASCULAR STRESS TEST  ~2003   WNL  . CAROTID ENDARTERECTOMY  2002   bilateral  . CAROTID STENT Right 2002  right after restenosis post CEA (Dr. Edd Arbour)  . CAROTID STENT  2014   for stenosis  . CAROTID STENT Left 2014   Dr. Ardeth Sportsman, [place]  . CESAREAN SECTION  1968; 1972  . COLONOSCOPY  03/2012   Sankar - polyp (rec rpt 5 yrs)  . EXPLORATORY LAPAROTOMY  1990s   for GI pain, inflamed lower GI, unrevealing workup, improved with abx  . HERNIA REPAIR  2005   abdomen  . MELANOMA EXCISION  1998   right shoulder  . TOTAL HIP ARTHROPLASTY  2005   bilat     Current Outpatient Medications  Medication Sig Dispense Refill  . Acetaminophen (TYLENOL ARTHRITIS PAIN PO) Take 1 tablet by mouth daily.    Marland Kitchen acetaminophen (TYLENOL) 500 MG tablet Take 500 mg by mouth daily.     Marland Kitchen apixaban (ELIQUIS) 5 MG TABS tablet Take 1 tablet (5 mg total) by mouth 2 (two) times daily. 180 tablet 1  . atorvastatin  (LIPITOR) 40 MG tablet Take 1 tablet (40 mg total) by mouth at bedtime. 90 tablet 3  . Cholecalciferol (VITAMIN D-3) 25 MCG (1000 UT) CAPS Take by mouth.    . ciprofloxacin (CIPRO) 500 MG tablet Take 1 tablet (500 mg total) by mouth 2 (two) times daily. With food 10 tablet 0  . diltiazem (CARDIZEM CD) 120 MG 24 hr capsule Take 1 capsule (120 mg total) by mouth daily. 90 capsule 3  . enalapril (VASOTEC) 5 MG tablet Take 1 tablet (5 mg total) by mouth daily. 90 tablet 3  . fluticasone (FLONASE) 50 MCG/ACT nasal spray Place 2 sprays into both nostrils daily as needed for allergies or rhinitis.    . metoprolol succinate (TOPROL-XL) 100 MG 24 hr tablet Take 1 tablet (100 mg total) by mouth daily. Take with or immediately following a meal. 90 tablet 3  . Multiple Vitamin (MULTIVITAMIN WITH MINERALS) TABS tablet Take 1 tablet by mouth daily.    Marland Kitchen triamcinolone cream (KENALOG) 0.1 % Apply 1 application topically daily as needed (skin cancer).     No current facility-administered medications for this visit.    Allergies:   Patient has no known allergies.    Social History:  The patient  reports that she has quit smoking. She has a 30.00 pack-year smoking history. She has never used smokeless tobacco. She reports current alcohol use. She reports that she does not use drugs.   Family History:  The patient's family history includes Cancer (age of onset: 32) in her father; Cancer (age of onset: 87) in her mother; Hypertension in her paternal grandfather.    ROS:  Please see the history of present illness.   Otherwise, review of systems are positive for some leg caludication.   All other systems are reviewed and negative.    PHYSICAL EXAM: VS:  BP 118/72   Pulse 86   Ht 5\' 2"  (1.575 m)   Wt 150 lb 12.8 oz (68.4 kg)   SpO2 97%   BMI 27.58 kg/m  , BMI Body mass index is 27.58 kg/m. GEN: Well nourished, well developed, in no acute distress  HEENT: normal  Neck: no JVD, carotid bruits, or masses  Cardiac: irregularly irregular; no murmurs, rubs, or gallops,no edema  Respiratory:  clear to auscultation bilaterally, normal work of breathing GI: soft, nontender, nondistended, + BS MS: no deformity or atrophy ; difficult to palpate pedal pulses Skin: warm and dry, no rash Neuro:  Strength and sensation are intact Psych: euthymic mood, full affect  EKG:   The ekg ordered Jan 2021 demonstrates AFib , rate controlled   Recent Labs: 02/17/2019: TSH 2.53 07/21/2019: ALT 13; BUN 20; Creatinine, Ser 1.06; Hemoglobin 14.8; Platelets 170.0; Potassium 4.4; Sodium 139   Lipid Panel    Component Value Date/Time   CHOL 120 07/21/2019 0833   TRIG 90.0 07/21/2019 0833   TRIG 144 03/28/2011 0000   HDL 39.20 07/21/2019 0833   CHOLHDL 3 07/21/2019 0833   VLDL 18.0 07/21/2019 0833   LDLCALC 62 07/21/2019 0833   LDLCALC 76 02/16/2018 0924   LDLDIRECT 78 03/28/2011 0000     Other studies Reviewed: Additional studies/ records that were reviewed today with results demonstrating: PMD labs reviewed.   ASSESSMENT AND PLAN:  1. Carotid artery disease/peripheral arterial disease: Will plan for carotid Duplex.   Follows with Dr. Fletcher Anon. Watch for any nonhealing ulcers on her toes and feet.  2. Atrial fibrillation: Eliquis for stroke prevention. Rate controlled.  3. Hypertensive heart disease. The current medical regimen is effective;  continue present plan and medications. 4. Hyperlipidemia: LDL 62 in 3/21.  Atorvastatin for lipids.  5. Anticoagulated: No bleeding issues.    Current medicines are reviewed at length with the patient today.  The patient concerns regarding her medicines were addressed.  The following changes have been made:  No change  Labs/ tests ordered today include:  No orders of the defined types were placed in this encounter.   Recommend 150 minutes/week of aerobic exercise Low fat, low carb, high fiber diet recommended  Disposition:   FU in 1 year   Signed,  Larae Grooms, MD  10/07/2019 11:00 AM    [address], [place], [place]  [address] Phone: [phone]; Fax: [phone]

## 2019-10-07 ENCOUNTER — Ambulatory Visit: Payer: Medicare Other | Admitting: Interventional Cardiology

## 2019-10-07 ENCOUNTER — Encounter: Payer: Self-pay | Admitting: Interventional Cardiology

## 2019-10-07 ENCOUNTER — Other Ambulatory Visit: Payer: Self-pay

## 2019-10-07 VITALS — BP 118/72 | HR 86 | Ht 62.0 in | Wt 150.8 lb

## 2019-10-07 DIAGNOSIS — I779 Disorder of arteries and arterioles, unspecified: Secondary | ICD-10-CM | POA: Diagnosis not present

## 2019-10-07 DIAGNOSIS — I10 Essential (primary) hypertension: Secondary | ICD-10-CM | POA: Diagnosis not present

## 2019-10-07 DIAGNOSIS — I739 Peripheral vascular disease, unspecified: Secondary | ICD-10-CM

## 2019-10-07 DIAGNOSIS — I4891 Unspecified atrial fibrillation: Secondary | ICD-10-CM | POA: Diagnosis not present

## 2019-10-07 DIAGNOSIS — Z7901 Long term (current) use of anticoagulants: Secondary | ICD-10-CM

## 2019-10-07 NOTE — Patient Instructions (Signed)
Medication Instructions:  Your physician recommends that you continue on your current medications as directed. Please refer to the Current Medication list given to you today.  *If you need a refill on your cardiac medications before your next appointment, please call your pharmacy*  Lab Work:  If you have labs (blood work) drawn today and your tests are completely normal, you will receive your results only by: Marland Kitchen MyChart Message (if you have MyChart) OR . A paper copy in the mail If you have any lab test that is abnormal or we need to change your treatment, we will call you to review the results.  Testing/Procedures: None ordered today.  Follow-Up: At [facility], you and your health needs are our priority.  As part of our continuing mission to provide you with exceptional heart care, we have created designated Provider Care Teams.  These Care Teams include your primary Cardiologist (physician) and Advanced Practice Providers (APPs -  Physician Assistants and Nurse Practitioners) who all work together to provide you with the care you need, when you need it.  We recommend signing up for the patient portal called "MyChart".  Sign up information is provided on this After Visit Summary.  MyChart is used to connect with patients for Virtual Visits (Telemedicine).  Patients are able to view lab/test results, encounter notes, upcoming appointments, etc.  Non-urgent messages can be sent to your provider as well.   To learn more about what you can do with MyChart, go to [email or web address].    Your next appointment:   1 year(s)  The format for your next appointment:   In Person  Provider:   You may see Larae Grooms, MD or one of the following Advanced Practice Providers on your designated Care Team:    Melina Copa, PA-C  Ermalinda Barrios, PA-C

## 2019-10-28 DIAGNOSIS — H5203 Hypermetropia, bilateral: Secondary | ICD-10-CM | POA: Diagnosis not present

## 2019-10-28 DIAGNOSIS — H16223 Keratoconjunctivitis sicca, not specified as Sjogren's, bilateral: Secondary | ICD-10-CM | POA: Diagnosis not present

## 2019-10-28 DIAGNOSIS — H2513 Age-related nuclear cataract, bilateral: Secondary | ICD-10-CM | POA: Diagnosis not present

## 2019-10-28 DIAGNOSIS — H524 Presbyopia: Secondary | ICD-10-CM | POA: Diagnosis not present

## 2019-10-28 DIAGNOSIS — H04123 Dry eye syndrome of bilateral lacrimal glands: Secondary | ICD-10-CM | POA: Diagnosis not present

## 2019-12-06 ENCOUNTER — Ambulatory Visit: Payer: Medicare Other | Admitting: Cardiovascular Disease

## 2019-12-06 ENCOUNTER — Encounter: Payer: Self-pay | Admitting: Cardiovascular Disease

## 2019-12-06 ENCOUNTER — Other Ambulatory Visit: Payer: Self-pay

## 2019-12-06 VITALS — BP 118/66 | HR 81 | Ht 62.0 in | Wt 153.2 lb

## 2019-12-06 DIAGNOSIS — I4821 Permanent atrial fibrillation: Secondary | ICD-10-CM

## 2019-12-06 DIAGNOSIS — I771 Stricture of artery: Secondary | ICD-10-CM

## 2019-12-06 DIAGNOSIS — I739 Peripheral vascular disease, unspecified: Secondary | ICD-10-CM

## 2019-12-06 DIAGNOSIS — E785 Hyperlipidemia, unspecified: Secondary | ICD-10-CM

## 2019-12-06 DIAGNOSIS — I779 Disorder of arteries and arterioles, unspecified: Secondary | ICD-10-CM

## 2019-12-06 NOTE — Progress Notes (Signed)
Cardiology Office Note   Date:  12/06/2019   ID:  [name], DOB 01-17-1941, MRN [id number]  PCP:  McLean-Scocuzza, Nino Glow, MD  Cardiologist: Dr. Irish Lack  No chief complaint on file.     History of Present Illness: [name] is a 79 y.o. female who is here today for follow-up visit regarding peripheral arterial disease.   She has known history of atrial fibrillation, hyperlipidemia, hypertension and carotid disease.  She is not a smoker and has no family history of coronary artery disease.  She is not diabetic. She is status post bilateral carotid artery stenting in 2014. She was seen in 2019 for bilateral calf claudication as well as arm discomfort with exertion. She had carotid Doppler done in November, 2019 which showed less than 50% stenosis bilaterally.  There was antegrade flow in both vertebral arteries and no significant disease in the subclavian arteries. She had noninvasive vascular evaluation done which showed an ABI of 0.88 on the right and 0.91 on the left but difficult to interpret this given that pressure in the arms was abnormal.  Duplex showed significant right common femoral artery disease with moderate SFA disease and occluded popliteal artery.  On the left, there was severe disease affecting the left distal SFA.  Duplex of the upper extremities showed severe subclavian disease bilaterally worse on the left side.  Aortic arch and lower extremity angiography was performed in February 2020. There was moderate right subclavian artery stenosis at the origin of the [place].  There was significant stenosis affecting the left proximal axillary artery.  In the right lower extremity, there was significant calcified common femoral artery disease extending into the ostium of the SFA with occluded popliteal artery and two-vessel runoff below the knee.  On the left, there was borderline significant common iliac artery stenosis with diffuse moderate disease affecting the  entire left SFA with severe focal stenosis distally and three-vessel runoff below the knee.  Given that the patient's symptoms were not lifestyle limiting, I advised her to start an exercise program before considering revascularization.   She has been doing well overall with no recent chest pain, shortness of breath or palpitations.  She denies any arm or leg claudication she continues to be active.  She rejoined the [facility] recently and has no limitations.  In addition, she walks for exercise.  She has no lower extremity ulceration.  She does complain of mild bilateral leg edema that is worse at the end of the day.  Past Medical History:  Diagnosis Date  . Arthritis    hands, knees, ankles  . BCC (basal cell carcinoma of skin) 2016   Stinehelfer  . Bilateral carotid artery stenosis    a. s/p right carotid stent after restenosis post CEA, left carotid stent (previously on Plavix for these). b. Carotid US 03/2016: stable 40-59% BICA.  Marland Kitchen Fibrocystic breast disease    followed by surgery Jamal Collin)  . GERD (gastroesophageal reflux disease)    OTC prilosec  . Hernia 2005  . History of chicken pox   . History of colon polyps   . History of melanoma 1990s   right shoulder  . HLD (hyperlipidemia)   . HTN (hypertension) 1970s  . Hypertensive retinopathy of both eyes, grade 1 2017   Bulakowski  . Kidney cyst, acquired 2016   large R septated 8cm, stable 2017  . Melanoma in situ ([place]) 2017   R medial posterior arm - lentio maligna type, margin involved ([place])  .  PAF (paroxysmal atrial fibrillation) ([place])    a. dx 12/2015 at routine physical.    Past Surgical History:  Procedure Laterality Date  . ABDOMINAL AORTOGRAM W/LOWER EXTREMITY N/A 06/23/2018   Procedure: ABDOMINAL AORTOGRAM W/LOWER EXTREMITY;  Surgeon: Wellington Hampshire, MD;  Location: [place] [facility];  Service: Cardiovascular;  Laterality: N/A;  . ABI  2007   R 1.19, L 1.2  . AORTIC ARCH ANGIOGRAPHY N/A 06/23/2018    Procedure: AORTIC ARCH ANGIOGRAPHY;  Surgeon: Wellington Hampshire, MD;  Location: [place] [facility];  Service: Cardiovascular;  Laterality: N/A;  . APPENDECTOMY  1995  . BREAST BIOPSY Left 1997   left  . BREAST CYST ASPIRATION Left 02/1997   fna  . BREAST EXCISIONAL BIOPSY Right 1998   benign per pt  . CARDIOVASCULAR STRESS TEST  ~2003   WNL  . CAROTID ENDARTERECTOMY  2002   bilateral  . CAROTID STENT Right 2002   right after restenosis post CEA (Dr. Edd Arbour)  . CAROTID STENT  2014   for stenosis  . CAROTID STENT Left 2014   Dr. Ardeth Sportsman, [place]  . CESAREAN SECTION  1968; 1972  . COLONOSCOPY  03/2012   Sankar - polyp (rec rpt 5 yrs)  . EXPLORATORY LAPAROTOMY  1990s   for GI pain, inflamed lower GI, unrevealing workup, improved with abx  . HERNIA REPAIR  2005   abdomen  . MELANOMA EXCISION  1998   right shoulder  . TOTAL HIP ARTHROPLASTY  2005   bilat     Current Outpatient Medications  Medication Sig Dispense Refill  . Acetaminophen (TYLENOL ARTHRITIS PAIN PO) Take 1 tablet by mouth daily.    Marland Kitchen acetaminophen (TYLENOL) 500 MG tablet Take 500 mg by mouth daily.     Marland Kitchen apixaban (ELIQUIS) 5 MG TABS tablet Take 1 tablet (5 mg total) by mouth 2 (two) times daily. 180 tablet 1  . atorvastatin (LIPITOR) 40 MG tablet Take 1 tablet (40 mg total) by mouth at bedtime. 90 tablet 3  . Cholecalciferol (VITAMIN D-3) 25 MCG (1000 UT) CAPS Take by mouth.    . ciprofloxacin (CIPRO) 500 MG tablet Take 1 tablet (500 mg total) by mouth 2 (two) times daily. With food 10 tablet 0  . diltiazem (CARDIZEM CD) 120 MG 24 hr capsule Take 1 capsule (120 mg total) by mouth daily. 90 capsule 3  . enalapril (VASOTEC) 5 MG tablet Take 1 tablet (5 mg total) by mouth daily. 90 tablet 3  . fluticasone (FLONASE) 50 MCG/ACT nasal spray Place 2 sprays into both nostrils daily as needed for allergies or rhinitis.    . metoprolol succinate (TOPROL-XL) 100 MG 24 hr tablet Take 1 tablet (100 mg total) by mouth daily. Take  with or immediately following a meal. 90 tablet 3  . Multiple Vitamin (MULTIVITAMIN WITH MINERALS) TABS tablet Take 1 tablet by mouth daily.    Marland Kitchen triamcinolone cream (KENALOG) 0.1 % Apply 1 application topically daily as needed (skin cancer).     No current facility-administered medications for this visit.    Allergies:   Patient has no known allergies.    Social History:  The patient  reports that she has quit smoking. She has a 30.00 pack-year smoking history. She has never used smokeless tobacco. She reports current alcohol use. She reports that she does not use drugs.   Family History:  The patient's family history includes Cancer (age of onset: 48) in her father; Cancer (age of onset: 50) in  her mother; Hypertension in her paternal grandfather.    ROS:  Please see the history of present illness.   Otherwise, review of systems are positive for none.   All other systems are reviewed and negative.    PHYSICAL EXAM: VS:  BP 118/66   Pulse 81   Ht 5\' 2"  (1.575 m)   Wt 153 lb 3.2 oz (69.5 kg)   SpO2 97%   BMI 28.02 kg/m  , BMI Body mass index is 28.02 kg/m. GEN: Well nourished, well developed, in no acute distress  HEENT: normal  Neck: no JVD, carotid bruits, or masses Cardiac: Irregularly irregular; no murmurs, rubs, or gallops, trace edema  Respiratory:  clear to auscultation bilaterally, normal work of breathing GI: soft, nontender, nondistended, + BS MS: no deformity or atrophy  Skin: warm and dry, no rash Neuro:  Strength and sensation are intact Psych: euthymic mood, full affect Vascular: Radial pulses +1 bilaterally.  Femoral pulses mildly diminished bilaterally.  Distal pulses are not palpable.   EKG:  EKG is ordered today. EKG showed atrial fibrillation with ventricular rate of 81 bpm.   Recent Labs: 02/17/2019: TSH 2.53 07/21/2019: ALT 13; BUN 20; Creatinine, Ser 1.06; Hemoglobin 14.8; Platelets 170.0; Potassium 4.4; Sodium 139    Lipid Panel    Component  Value Date/Time   CHOL 120 07/21/2019 0833   TRIG 90.0 07/21/2019 0833   TRIG 144 03/28/2011 0000   HDL 39.20 07/21/2019 0833   CHOLHDL 3 07/21/2019 0833   VLDL 18.0 07/21/2019 0833   LDLCALC 62 07/21/2019 0833   LDLCALC 76 02/16/2018 0924   LDLDIRECT 78 03/28/2011 0000      Wt Readings from Last 3 Encounters:  12/06/19 153 lb 3.2 oz (69.5 kg)  10/07/19 150 lb 12.8 oz (68.4 kg)  07/06/19 148 lb (67.1 kg)       No flowsheet data found.    ASSESSMENT AND PLAN:  1.  Peripheral arterial disease: She currently has no claudication or evidence of critical limb ischemia.  Thus, I recommend continuing medical therapy.   The patient has diffuse disease as outlined above.  If symptoms develop, the disease on the right lower extremity requires right femoral endarterectomy into the ostium of the SFA or atherectomy as a second choice.  On the left side, the whole SFA will need to be treated.  2.  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 she does not have significant arm claudication and has no symptoms suggestive of steal syndrome.  Continue medical therapy.  3.  Carotid disease: Status post bilateral carotid artery stenting in 2014.  Carotid Doppler in 2019 showed  less than 50% stenosis bilaterally.  Repeat carotid Doppler next year.  4.  Atrial fibrillation: Ventricular rate is controlled and she is tolerating anticoagulation.  5.  Hyperlipidemia: Continue treatment with atorvastatin with a target LDL of less than 70.  Most recent lipid profile showed an LDL of 62.   Disposition:   FU with me in 12 months  Signed,  Kathlyn Sacramento, MD  12/06/2019 9:37 AM    [place]

## 2019-12-06 NOTE — Patient Instructions (Signed)

## 2019-12-26 ENCOUNTER — Other Ambulatory Visit: Payer: Self-pay | Admitting: Cardiovascular Disease

## 2019-12-27 NOTE — Telephone Encounter (Signed)
Please review for refill, Thanks !  

## 2020-01-11 ENCOUNTER — Other Ambulatory Visit: Payer: Self-pay | Admitting: Physician Assistant

## 2020-04-10 ENCOUNTER — Ambulatory Visit: Payer: Medicare Other

## 2020-05-14 ENCOUNTER — Telehealth: Payer: Self-pay

## 2020-05-14 NOTE — Telephone Encounter (Signed)
Noted  For your information   

## 2020-05-14 NOTE — Telephone Encounter (Signed)
Agree or [facility] care Hope shes ok

## 2020-05-14 NOTE — Telephone Encounter (Signed)
Pt called and asked if she could have antibiotics for a sinus infection. We did not have any appts avail at the time of call. She states that she has no problem going to urgent care. I told her about the one across the street from [place] and she said she will go to get a covid test just to be safe. FYI

## 2020-05-15 ENCOUNTER — Ambulatory Visit
Admission: EM | Admit: 2020-05-15 | Discharge: 2020-05-15 | Disposition: A | Discharge status: Home / Self Care | Payer: Medicare Other | Attending: Family Medicine | Admitting: Family Medicine

## 2020-05-15 ENCOUNTER — Other Ambulatory Visit: Payer: Self-pay

## 2020-05-15 ENCOUNTER — Ambulatory Visit
Admission: RE | Admit: 2020-05-15 | Discharge: 2020-05-15 | Disposition: A | Discharge status: Home / Self Care | Payer: Medicare Other | Source: Ambulatory Visit

## 2020-05-15 DIAGNOSIS — Z7689 Persons encountering health services in other specified circumstances: Secondary | ICD-10-CM | POA: Diagnosis not present

## 2020-05-15 DIAGNOSIS — R059 Cough, unspecified: Secondary | ICD-10-CM

## 2020-05-15 MED ORDER — AZITHROMYCIN 250 MG PO TABS: 250.0000 mg | ORAL_TABLET | Freq: Every day | ORAL | 0 refills | Status: DC

## 2020-05-15 NOTE — ED Provider Notes (Signed)
[name]    CSN: [id number] Arrival date & time: 05/15/20  1205      History   Chief Complaint No chief complaint on file.   HPI [name] is a 80 y.o. female.   Patient is an 80 year old female that presents today with cough, congestion x1 week.  Symptoms have somewhat worsened.  Coughing up mucus.  Mild nasal congestion and sore throat.  Taking Coricidin.  No fevers, chills, body aches.     Past Medical History:  Diagnosis Date  . Arthritis    hands, knees, ankles  . BCC (basal cell carcinoma of skin) 2016   [place]fer  . Bilateral carotid artery stenosis    a. s/p right carotid stent after restenosis post CEA, left carotid stent (previously on Plavix for these). b. Carotid US 03/2016: stable 40-59% BICA.  Marland Kitchen Fibrocystic breast disease    followed by surgery Jamal Collin)  . GERD (gastroesophageal reflux disease)    OTC prilosec  . Hernia 2005  . History of chicken pox   . History of colon polyps   . History of melanoma 1990s   right shoulder  . HLD (hyperlipidemia)   . HTN (hypertension) 1970s  . Hypertensive retinopathy of both eyes, grade 1 2017   Bulakowski  . Kidney cyst, acquired 2016   large R septated 8cm, stable 2017  . Melanoma in situ ([place]) 2017   R medial posterior arm - lentio maligna type, margin involved ([place])  . PAF (paroxysmal atrial fibrillation) ([place])    a. dx 12/2015 at routine physical.    Patient Active Problem List   Diagnosis Date Noted  . Prediabetes 04/26/2018  . History of skin cancer 02/12/2018  . Hypertensive heart disease 04/18/2016  . Aortic atherosclerosis ([place]) 01/26/2016  . Polycythemia 01/23/2016  . A-fib ([place]) 12/31/2015  . Melanoma in situ ([place])   . Incisional hernia, without obstruction or gangrene 05/28/2015  . Renal cyst, acquired, right 05/21/2015  . Advanced care planning/counseling discussion 11/03/2014  . Hepatomegaly 11/03/2014  . Fibrocystic breast 05/24/2014  . History of melanoma  05/24/2014  . Joint pain 03/20/2014  . Health care maintenance 10/25/2013  . Medicare annual wellness visit, subsequent 10/13/2012  . CKD (chronic kidney disease) stage 3, GFR 30-59 ml/min (HCC) 10/13/2012  . GERD (gastroesophageal reflux disease)   . HTN (hypertension)   . HLD (hyperlipidemia)   . Bilateral carotid artery disease ([place])   . Arthritis     Past Surgical History:  Procedure Laterality Date  . ABDOMINAL AORTOGRAM W/LOWER EXTREMITY N/A 06/23/2018   Procedure: ABDOMINAL AORTOGRAM W/LOWER EXTREMITY;  Surgeon: Wellington Hampshire, MD;  Location: [facility];  Service: Cardiovascular;  Laterality: N/A;  . ABI  2007   R 1.19, L 1.2  . AORTIC ARCH ANGIOGRAPHY N/A 06/23/2018   Procedure: AORTIC ARCH ANGIOGRAPHY;  Surgeon: Wellington Hampshire, MD;  Location: [facility];  Service: Cardiovascular;  Laterality: N/A;  . APPENDECTOMY  1995  . BREAST BIOPSY Left 1997   left  . BREAST CYST ASPIRATION Left 02/1997   fna  . BREAST EXCISIONAL BIOPSY Right 1998   benign per pt  . CARDIOVASCULAR STRESS TEST  ~2003   WNL  . CAROTID ENDARTERECTOMY  2002   bilateral  . CAROTID STENT Right 2002   right after restenosis post CEA (Dr. Edd Arbour)  . CAROTID STENT  2014   for stenosis  . CAROTID STENT Left 2014   Dr. Ardeth Sportsman, [place]  . CESAREAN  SECTION  1968; 1972  . COLONOSCOPY  03/2012   Sankar - polyp (rec rpt 5 yrs)  . EXPLORATORY LAPAROTOMY  1990s   for GI pain, inflamed lower GI, unrevealing workup, improved with abx  . HERNIA REPAIR  2005   abdomen  . MELANOMA EXCISION  1998   right shoulder  . TOTAL HIP ARTHROPLASTY  2005   bilat    OB History    Gravida  2   Para      Term      Preterm      AB      Living  2     SAB      IAB      Ectopic      Multiple      Live Births           Obstetric Comments  Age with first menstruation-14 Age with first pregnancy-28 LMP-age 1         Home Medications    Prior to Admission medications    Medication Sig Start Date End Date Taking? Authorizing Provider  Acetaminophen (TYLENOL ARTHRITIS PAIN PO) Take 1 tablet by mouth daily.   Yes [provider]  acetaminophen (TYLENOL) 500 MG tablet Take 500 mg by mouth daily.    Yes [provider]  atorvastatin (LIPITOR) 40 MG tablet TAKE 1 TABLET BY MOUTH AT  BEDTIME 01/12/20  Yes Dyann Kief, PA-C  azithromycin (ZITHROMAX) 250 MG tablet Take 1 tablet (250 mg total) by mouth daily. Take first 2 tablets together, then 1 every day until finished. 05/15/20  Yes Mcclain Shall A, NP  CARTIA XT 120 MG 24 hr capsule TAKE 1 CAPSULE BY MOUTH  DAILY 01/12/20  Yes Dyann Kief, PA-C  Cholecalciferol (VITAMIN D-3) 25 MCG (1000 UT) CAPS Take by mouth.   Yes [provider]  ELIQUIS 5 MG TABS tablet TAKE 1 TABLET BY MOUTH  TWICE DAILY 12/27/19  Yes Iran Ouch, MD  enalapril (VASOTEC) 5 MG tablet TAKE 1 TABLET BY MOUTH  DAILY 01/12/20  Yes Dyann Kief, PA-C  fluticasone (FLONASE) 50 MCG/ACT nasal spray Place 2 sprays into both nostrils daily as needed for allergies or rhinitis.   Yes [provider]  metoprolol succinate (TOPROL-XL) 100 MG 24 hr tablet TAKE 1 TABLET BY MOUTH  DAILY WITH OR IMMEDIATELY  FOLLOWING A MEAL 01/12/20  Yes Dyann Kief, PA-C  Multiple Vitamin (MULTIVITAMIN WITH MINERALS) TABS tablet Take 1 tablet by mouth daily.   Yes [provider]  ciprofloxacin (CIPRO) 500 MG tablet Take 1 tablet (500 mg total) by mouth 2 (two) times daily. With food 07/25/19   McLean-Scocuzza, Pasty Spillers, MD  triamcinolone cream (KENALOG) 0.1 % Apply 1 application topically daily as needed (skin cancer).    [provider]    Family History Family History  Problem Relation Age of Onset  . Cancer Mother 69       lung, smoker  . Cancer Father 40       prostate  . Hypertension Paternal Grandfather   . CAD Neg Hx   . Stroke Neg Hx   . Diabetes Neg Hx   . Heart attack Neg Hx   . Breast cancer  Neg Hx     Social History Social History   Tobacco Use  . Smoking status: Former Smoker    Packs/day: 1.00    Years: 30.00    Pack years: 30.00  . Smokeless tobacco: Never Used  Vaping Use  . Vaping Use: Never used  Substance Use Topics  . Alcohol use: Yes    Alcohol/week: 0.0 standard drinks    Comment: 1 glass wine daily  . Drug use: No     Allergies   Patient has no known allergies.   Review of Systems Review of Systems   Physical Exam Triage Vital Signs ED Triage Vitals  Enc Vitals Group     BP 05/15/20 1311 (!) 152/95     Pulse Rate 05/15/20 1311 79     Resp 05/15/20 1311 16     Temp 05/15/20 1311 97.8 F (36.6 C)     Temp Source 05/15/20 1311 Oral     SpO2 05/15/20 1311 97 %     Weight --      Height --      Head Circumference --      Peak Flow --      Pain Score 05/15/20 1246 0     Pain Loc --      Pain Edu? --      Excl. in [place]? --    No data found.  Updated Vital Signs BP (!) 152/95 (BP Location: Right Arm)   Pulse 79   Temp 97.8 F (36.6 C) (Oral)   Resp 16   SpO2 97%   Visual Acuity Right Eye Distance:   Left Eye Distance:   Bilateral Distance:    Right Eye Near:   Left Eye Near:    Bilateral Near:     Physical Exam Vitals and nursing note reviewed.  Constitutional:      General: She is not in acute distress.    Appearance: Normal appearance. She is not ill-appearing, toxic-appearing or diaphoretic.  HENT:     Head: Normocephalic.     Nose: Nose normal.     Mouth/Throat:     Pharynx: Oropharynx is clear.  Eyes:     Conjunctiva/sclera: Conjunctivae normal.  Cardiovascular:     Rate and Rhythm: Normal rate and regular rhythm.  Pulmonary:     Effort: Pulmonary effort is normal.     Breath sounds: Normal breath sounds.  Musculoskeletal:        General: Normal range of motion.     Cervical back: Normal range of motion.  Skin:    General: Skin is warm and dry.     Findings: No rash.  Neurological:     Mental Status: She  is alert.  Psychiatric:        Mood and Affect: Mood normal.      UC Treatments / Results  Labs (all labs ordered are listed, but only abnormal results are displayed) Labs Reviewed  NOVEL CORONAVIRUS, NAA    EKG   Radiology No results found.  Procedures Procedures (including critical care time)  Medications Ordered in UC Medications - No data to display  Initial Impression / Assessment and Plan / UC Course  I have reviewed the triage vital signs and the nursing notes.  Pertinent labs & imaging results that were available during my care of the patient were reviewed by me and considered in my medical decision making (see chart for details).     Cough- worsening over the week. Concern for pneumonia Treated with azithromycin. Continue the Coricidin Follow up as needed for continued or worsening symptoms  Final Clinical Impressions(s) / UC Diagnoses   Final diagnoses:  Cough     Discharge Instructions     Medication as prescribed.  Continue the Coricidin.  Covid test pending Follow up as needed for continued or worsening symptoms     ED Prescriptions    Medication Sig Dispense Auth. Provider   azithromycin (ZITHROMAX) 250 MG tablet Take 1 tablet (250 mg total) by mouth daily. Take first 2 tablets together, then 1 every day until finished. 6 tablet Loura Halt A, NP     PDMP not reviewed this encounter.   Orvan July, NP 05/15/20 1338

## 2020-05-15 NOTE — Discharge Instructions (Signed)
Medication as prescribed.  Continue the Coricidin. Covid test pending Follow up as needed for continued or worsening symptoms

## 2020-05-15 NOTE — ED Triage Notes (Signed)
Sore throat, cough x 1 week

## 2020-05-17 LAB — NOVEL CORONAVIRUS, NAA: SARS-CoV-2, NAA: NOT DETECTED

## 2020-05-17 LAB — SARS-COV-2, NAA 2 DAY TAT

## 2020-06-13 ENCOUNTER — Other Ambulatory Visit: Payer: Self-pay | Admitting: Cardiovascular Disease

## 2020-06-14 MED ORDER — APIXABAN 5 MG PO TABS: 5.0000 mg | ORAL_TABLET | Freq: Two times a day (BID) | ORAL | 0 refills | Status: DC

## 2020-06-14 NOTE — Telephone Encounter (Signed)
51f, 69.5kg, scr 1.06 (07/21/19), lovw/arida (12/06/19). refill request received for eliquis 5mg  and pt qualifies so a refill will be sent.

## 2020-06-14 NOTE — Telephone Encounter (Signed)
Prescription refill request for Eliquis received. Indication: A-fib Last office visit: 12/06/19  Due back 11/2020  Will need CBC/BMP Scr:  1.06 Age: 80 Weight: 69.5kg  Eliquis refill approved for 3 months until OV with Dr Fletcher Anon

## 2020-06-14 NOTE — Telephone Encounter (Signed)
Refill Request.  

## 2020-06-21 ENCOUNTER — Telehealth: Payer: Self-pay | Admitting: Internal Medicine

## 2020-06-21 NOTE — Telephone Encounter (Signed)
Left message for patient to call back and schedule Medicare Annual Wellness Visit (AWV)   This should be a virtual visit only=30 minutes.  Last AWV 04/06/19; please schedule at anytime with Denisa O'Brien-Blaney at [facility].

## 2020-06-28 ENCOUNTER — Ambulatory Visit (INDEPENDENT_AMBULATORY_CARE_PROVIDER_SITE_OTHER): Payer: Medicare Other

## 2020-06-28 VITALS — BP 129/87 | Ht 62.0 in | Wt 153.0 lb

## 2020-06-28 DIAGNOSIS — Z0000 Encounter for general adult medical examination without abnormal findings: Secondary | ICD-10-CM

## 2020-06-28 NOTE — Patient Instructions (Addendum)
[name] , Thank you for taking time to come for your Medicare Wellness Visit. I appreciate your ongoing commitment to your health goals. Please review the following plan we discussed and let me know if I can assist you in the future.   These are the goals we discussed: Goals    . Follow up with Primary Care Provider     As needed       This is a list of the screening recommended for you and due dates:  Health Maintenance  Topic Date Due  . COVID-19 Vaccine (4 - Booster for Pfizer series) 10/27/2020  . Tetanus Vaccine  10/14/2022  . Flu Shot  Completed  . DEXA scan (bone density measurement)  Completed  . Pneumonia vaccines  Completed    Immunizations Immunization History  Administered Date(s) Administered  . Fluad Quad(high Dose 65+) 01/14/2019  . Influenza Whole 01/10/2013  . Influenza, High Dose Seasonal PF 01/24/2015, 02/18/2016, 02/09/2017, 02/02/2018  . Influenza, Seasonal, Injecte, Preservative Fre 07/11/2014  . Influenza-Unspecified 02/03/2020  . PFIZER(Purple Top)SARS-COV-2 Vaccination 06/16/2019, 07/11/2019, 04/28/2020  . Pneumococcal Conjugate-13 11/03/2014  . Pneumococcal Polysaccharide-23 10/13/2012, 02/02/2018  . Td 10/13/2012  . Zoster 10/23/2012  . Zoster Recombinat (Shingrix) 12/23/2019, 05/24/2020   Advanced directives: on file  Conditions/risks identified: none new  Follow up in one year for your annual wellness visit.   Preventive Care 69 Years and Older, Female Preventive care refers to lifestyle choices and visits with your health care provider that can promote health and wellness. What does preventive care include?  A yearly physical exam. This is also called an annual well check.  Dental exams once or twice a year.  Routine eye exams. Ask your health care provider how often you should have your eyes checked.  Personal lifestyle choices, including:  Daily care of your teeth and gums.  Regular physical activity.  Eating a healthy  diet.  Avoiding tobacco and drug use.  Limiting alcohol use.  Practicing safe sex.  Taking low-dose aspirin every day.  Taking vitamin and mineral supplements as recommended by your health care provider. What happens during an annual well check? The services and screenings done by your health care provider during your annual well check will depend on your age, overall health, lifestyle risk factors, and family history of disease. Counseling  Your health care provider may ask you questions about your:  Alcohol use.  Tobacco use.  Drug use.  Emotional well-being.  Home and relationship well-being.  Sexual activity.  Eating habits.  History of falls.  Memory and ability to understand (cognition).  Work and work Statistician.  Reproductive health. Screening  You may have the following tests or measurements:  Height, weight, and BMI.  Blood pressure.  Lipid and cholesterol levels. These may be checked every 5 years, or more frequently if you are over 61 years old.  Skin check.  Lung cancer screening. You may have this screening every year starting at age 82 if you have a 30-pack-year history of smoking and currently smoke or have quit within the past 15 years.  Fecal occult blood test (FOBT) of the stool. You may have this test every year starting at age 82.  Flexible sigmoidoscopy or colonoscopy. You may have a sigmoidoscopy every 5 years or a colonoscopy every 10 years starting at age 82.  Hepatitis C blood test.  Hepatitis B blood test.  Sexually transmitted disease (STD) testing.  Diabetes screening. This is done by checking your blood sugar (glucose) after  you have not eaten for a while (fasting). You may have this done every 1-3 years.  Bone density scan. This is done to screen for osteoporosis. You may have this done starting at age 24.  Mammogram. This may be done every 1-2 years. Talk to your health care provider about how often you should have  regular mammograms. Talk with your health care provider about your test results, treatment options, and if necessary, the need for more tests. Vaccines  Your health care provider may recommend certain vaccines, such as:  Influenza vaccine. This is recommended every year.  Tetanus, diphtheria, and acellular pertussis (Tdap, Td) vaccine. You may need a Td booster every 10 years.  Zoster vaccine. You may need this after age 35.  Pneumococcal 13-valent conjugate (PCV13) vaccine. One dose is recommended after age 82.  Pneumococcal polysaccharide (PPSV23) vaccine. One dose is recommended after age 82. Talk to your health care provider about which screenings and vaccines you need and how often you need them. This information is not intended to replace advice given to you by your health care provider. Make sure you discuss any questions you have with your health care provider. Document Released: 05/25/2015 Document Revised: 01/16/2016 Document Reviewed: 02/27/2015 Elsevier Interactive Patient Education  2017 [place] Prevention in the Home Falls can cause injuries. They can happen to people of all ages. There are many things you can do to make your home safe and to help prevent falls. What can I do on the outside of my home?  Regularly fix the edges of walkways and driveways and fix any cracks.  Remove anything that might make you trip as you walk through a door, such as a raised step or threshold.  Trim any bushes or trees on the path to your home.  Use bright outdoor lighting.  Clear any walking paths of anything that might make someone trip, such as rocks or tools.  Regularly check to see if handrails are loose or broken. Make sure that both sides of any steps have handrails.  Any raised decks and porches should have guardrails on the edges.  Have any leaves, snow, or ice cleared regularly.  Use sand or salt on walking paths during winter.  Clean up any spills in your  garage right away. This includes oil or grease spills. What can I do in the bathroom?  Use night lights.  Install grab bars by the toilet and in the tub and shower. Do not use towel bars as grab bars.  Use non-skid mats or decals in the tub or shower.  If you need to sit down in the shower, use a plastic, non-slip stool.  Keep the floor dry. Clean up any water that spills on the floor as soon as it happens.  Remove soap buildup in the tub or shower regularly.  Attach bath mats securely with double-sided non-slip rug tape.  Do not have throw rugs and other things on the floor that can make you trip. What can I do in the bedroom?  Use night lights.  Make sure that you have a light by your bed that is easy to reach.  Do not use any sheets or blankets that are too big for your bed. They should not hang down onto the floor.  Have a firm chair that has side arms. You can use this for support while you get dressed.  Do not have throw rugs and other things on the floor that can make you trip.  What can I do in the kitchen?  Clean up any spills right away.  Avoid walking on wet floors.  Keep items that you use a lot in easy-to-reach places.  If you need to reach something above you, use a strong step stool that has a grab bar.  Keep electrical cords out of the way.  Do not use floor polish or wax that makes floors slippery. If you must use wax, use non-skid floor wax.  Do not have throw rugs and other things on the floor that can make you trip. What can I do with my stairs?  Do not leave any items on the stairs.  Make sure that there are handrails on both sides of the stairs and use them. Fix handrails that are broken or loose. Make sure that handrails are as long as the stairways.  Check any carpeting to make sure that it is firmly attached to the stairs. Fix any carpet that is loose or worn.  Avoid having throw rugs at the top or bottom of the stairs. If you do have throw  rugs, attach them to the floor with carpet tape.  Make sure that you have a light switch at the top of the stairs and the bottom of the stairs. If you do not have them, ask someone to add them for you. What else can I do to help prevent falls?  Wear shoes that:  Do not have high heels.  Have rubber bottoms.  Are comfortable and fit you well.  Are closed at the toe. Do not wear sandals.  If you use a stepladder:  Make sure that it is fully opened. Do not climb a closed stepladder.  Make sure that both sides of the stepladder are locked into place.  Ask someone to hold it for you, if possible.  Clearly mark and make sure that you can see:  Any grab bars or handrails.  First and last steps.  Where the edge of each step is.  Use tools that help you move around (mobility aids) if they are needed. These include:  Canes.  Walkers.  Scooters.  Crutches.  Turn on the lights when you go into a dark area. Replace any light bulbs as soon as they burn out.  Set up your furniture so you have a clear path. Avoid moving your furniture around.  If any of your floors are uneven, fix them.  If there are any pets around you, be aware of where they are.  Review your medicines with your doctor. Some medicines can make you feel dizzy. This can increase your chance of falling. Ask your doctor what other things that you can do to help prevent falls. This information is not intended to replace advice given to you by your health care provider. Make sure you discuss any questions you have with your health care provider. Document Released: 02/22/2009 Document Revised: 10/04/2015 Document Reviewed: 06/02/2014 Elsevier Interactive Patient Education  2017 [employer].

## 2020-06-28 NOTE — Progress Notes (Signed)
Subjective:   [name] is a 80 y.o. female who presents for Medicare Annual (Subsequent) preventive examination.  Review of Systems    No ROS.  Medicare Wellness Virtual Visit.    Cardiac Risk Factors include: advanced age (>37men, >72 women)     Objective:    Today's Vitals   06/28/20 1333  BP: 129/87  Weight: 153 lb (69.4 kg)  Height: 5\' 2"  (1.575 m)   Body mass index is 27.98 kg/m.  Advanced Directives 06/28/2020 04/06/2019 06/23/2018  Does Patient Have a Medical Advance Directive? Yes Yes Yes  Type of Paramedic of [place];Living will [place];Living will [place];Living will  Does patient want to make changes to medical advance directive? No - Patient declined No - Patient declined No - Patient declined  Copy of [place] in Chart? Yes - validated most recent copy scanned in chart (See row information) Yes - validated most recent copy scanned in chart (See row information) No - copy requested    Current Medications (verified) Outpatient Encounter Medications as of 06/28/2020  Medication Sig  . Acetaminophen (TYLENOL ARTHRITIS PAIN PO) Take 1 tablet by mouth daily.  Marland Kitchen acetaminophen (TYLENOL) 500 MG tablet Take 500 mg by mouth daily.   Marland Kitchen apixaban (ELIQUIS) 5 MG TABS tablet Take 1 tablet (5 mg total) by mouth 2 (two) times daily.  Marland Kitchen atorvastatin (LIPITOR) 40 MG tablet TAKE 1 TABLET BY MOUTH AT  BEDTIME  . azithromycin (ZITHROMAX) 250 MG tablet Take 1 tablet (250 mg total) by mouth daily. Take first 2 tablets together, then 1 every day until finished.  Marland Kitchen CARTIA XT 120 MG 24 hr capsule TAKE 1 CAPSULE BY MOUTH  DAILY  . Cholecalciferol (VITAMIN D-3) 25 MCG (1000 UT) CAPS Take by mouth.  . ciprofloxacin (CIPRO) 500 MG tablet Take 1 tablet (500 mg total) by mouth 2 (two) times daily. With food  . enalapril (VASOTEC) 5 MG tablet TAKE 1 TABLET BY MOUTH  DAILY  . fluticasone (FLONASE) 50  MCG/ACT nasal spray Place 2 sprays into both nostrils daily as needed for allergies or rhinitis.  Marland Kitchen FLUZONE HIGH-DOSE QUADRIVALENT 0.7 ML SUSY   . metoprolol succinate (TOPROL-XL) 100 MG 24 hr tablet TAKE 1 TABLET BY MOUTH  DAILY WITH OR IMMEDIATELY  FOLLOWING A MEAL  . Multiple Vitamin (MULTIVITAMIN WITH MINERALS) TABS tablet Take 1 tablet by mouth daily.  Marland Kitchen triamcinolone cream (KENALOG) 0.1 % Apply 1 application topically daily as needed (skin cancer).   No facility-administered encounter medications on file as of 06/28/2020.    Allergies (verified) Patient has no known allergies.   History: Past Medical History:  Diagnosis Date  . Arthritis    hands, knees, ankles  . BCC (basal cell carcinoma of skin) 2016   Stinehelfer  . Bilateral carotid artery stenosis    a. s/p right carotid stent after restenosis post CEA, left carotid stent (previously on Plavix for these). b. Carotid US 03/2016: stable 40-59% BICA.  Marland Kitchen Fibrocystic breast disease    followed by surgery Jamal Collin)  . GERD (gastroesophageal reflux disease)    OTC prilosec  . Hernia 2005  . History of chicken pox   . History of colon polyps   . History of melanoma 1990s   right shoulder  . HLD (hyperlipidemia)   . HTN (hypertension) 1970s  . Hypertensive retinopathy of both eyes, grade 1 2017   Bulakowski  . Kidney cyst, acquired 2016  large R septated 8cm, stable 2015-08-19  . Melanoma in situ ([place]) August 19, 2015   R medial posterior arm - lentio maligna type, margin involved ([place])  . PAF (paroxysmal atrial fibrillation) ([place])    a. dx 12/2015 at routine physical.   Past Surgical History:  Procedure Laterality Date  . ABDOMINAL AORTOGRAM W/LOWER EXTREMITY N/A 06/23/2018   Procedure: ABDOMINAL AORTOGRAM W/LOWER EXTREMITY;  Surgeon: Wellington Hampshire, MD;  Location: [facility];  Service: Cardiovascular;  Laterality: N/A;  . ABI  08-18-05   R 1.19, L 1.2  . AORTIC ARCH ANGIOGRAPHY N/A 06/23/2018   Procedure: AORTIC ARCH  ANGIOGRAPHY;  Surgeon: Wellington Hampshire, MD;  Location: [facility];  Service: Cardiovascular;  Laterality: N/A;  . APPENDECTOMY  1995  . BREAST BIOPSY Left 1997   left  . BREAST CYST ASPIRATION Left 02/1997   fna  . BREAST EXCISIONAL BIOPSY Right 1998   benign per pt  . CARDIOVASCULAR STRESS TEST  ~2003   WNL  . CAROTID ENDARTERECTOMY  08/18/2000   bilateral  . CAROTID STENT Right 18-Aug-2000   right after restenosis post CEA (Dr. Edd Arbour)  . CAROTID STENT  August 18, 2012   for stenosis  . CAROTID STENT Left 08-18-12   Dr. Ardeth Sportsman, [place]  . CESAREAN SECTION  1968; 1970/08/19  . COLONOSCOPY  03/2012   Sankar - polyp (rec rpt 5 yrs)  . EXPLORATORY LAPAROTOMY  1990s   for GI pain, inflamed lower GI, unrevealing workup, improved with abx  . HERNIA REPAIR  08-19-2003   abdomen  . MELANOMA EXCISION  1998   right shoulder  . TOTAL HIP ARTHROPLASTY  08-19-2003   bilat   Family History  Problem Relation Age of Onset  . Cancer Mother 35       lung, smoker  . Cancer Father 72       prostate  . Hypertension Paternal Grandfather   . CAD Neg Hx   . Stroke Neg Hx   . Diabetes Neg Hx   . Heart attack Neg Hx   . Breast cancer Neg Hx    Social History   Socioeconomic History  . Marital status: Widowed    Spouse name: Not on file  . Number of children: Not on file  . Years of education: Not on file  . Highest education level: Not on file  Occupational History  . Not on file  Tobacco Use  . Smoking status: Former Smoker    Packs/day: 1.00    Years: 30.00    Pack years: 30.00  . Smokeless tobacco: Never Used  Vaping Use  . Vaping Use: Never used  Substance and Sexual Activity  . Alcohol use: Yes    Alcohol/week: 0.0 standard drinks    Comment: 1 glass wine daily  . Drug use: No  . Sexual activity: Not on file  Other Topics Concern  . Not on file  Social History Narrative   Caffeine: 3 cups coffee/day   Widow of husband Jori Moll) died in August 18, 2013 cancer, lives with 1 dog.  Grown children   Occupation:  homemaker, used to be Catering manager   Edu: college   Activity: plays golf, walks dog, swims   Diet: good water, fruits/vegetables daily   Former smoker age 56 to 1958/9 1 pk lasted 1 week    Social Determinants of Radio broadcast assistant Strain: [place]   . Difficulty of Paying Living Expenses: Not hard at all  Food Insecurity: No Food  Insecurity  . Worried About Charity fundraiser in the Last Year: Never true  . Ran Out of Food in the Last Year: Never true  Transportation Needs: No Transportation Needs  . Lack of Transportation (Medical): No  . Lack of Transportation (Non-Medical): No  Physical Activity: Sufficiently Active  . Days of Exercise per Week: 3 days  . Minutes of Exercise per Session: 60 min  Stress: No Stress Concern Present  . Feeling of Stress : Not at all  Social Connections: Unknown  . Frequency of Communication with Friends and Family: Not on file  . Frequency of Social Gatherings with Friends and Family: Three times a week  . Attends Religious Services: Not on file  . Active Member of Clubs or Organizations: Not on file  . Attends Archivist Meetings: Not on file  . Marital Status: Not on file    Tobacco Counseling Counseling given: Not Answered   Clinical Intake:  Pre-visit preparation completed: Yes        Diabetes: No  How often do you need to have someone help you when you read instructions, pamphlets, or other written materials from your doctor or pharmacy?: 1 - Never   Interpreter Needed?: No      Activities of Daily Living In your present state of health, do you have any difficulty performing the following activities: 06/28/2020  Hearing? N  Vision? N  Difficulty concentrating or making decisions? N  Walking or climbing stairs? N  Dressing or bathing? N  Doing errands, shopping? N  Preparing Food and eating ? N  Using the Toilet? N  In the past six months, have you accidently leaked urine? N  Do you have  problems with loss of bowel control? N  Managing your Medications? N  Managing your Finances? N  Housekeeping or managing your Housekeeping? N  Some recent data might be hidden    Patient Care Team: McLean-Scocuzza, Nino Glow, MD as PCP - General (Internal Medicine) Jettie Booze, MD as PCP - Cardiology (Cardiology) Christene Lye, MD (General Surgery) Margarita Rana, MD as Referring Physician (Family Medicine)  Indicate any recent Medical Services you may have received from other than [facility] providers in the past year (date may be approximate).     Assessment:   This is a routine wellness examination for [place].  I connected with [name] today by telephone and verified that I am speaking with the correct person using two identifiers. Location patient: home Location provider: work Persons participating in the virtual visit: patient, Marine scientist.    I discussed the limitations, risks, security and privacy concerns of performing an evaluation and management service by telephone and the availability of in person appointments. The patient expressed understanding and verbally consented to this telephonic visit.    Interactive audio and video telecommunications were attempted between this provider and patient, however failed, due to patient having technical difficulties OR patient did not have access to video capability.  We continued and completed visit with audio only.  Some vital signs may be absent or patient reported.   Hearing/Vision screen  Hearing Screening   125Hz  250Hz  500Hz  1000Hz  2000Hz  3000Hz  4000Hz  6000Hz  8000Hz   Right ear:           Left ear:           Comments: Patient is able to hear conversational tones without difficulty.  No issues reported.  Vision Screening Comments: [facility] Wears corrective lenses Visual acuity not assessed, virtual visit.  They have seen their ophthalmologist in the last 12 months.     Dietary issues and exercise  activities discussed: Current Exercise Habits: Home exercise routine, Time (Minutes): 60, Frequency (Times/Week): 3, Weekly Exercise (Minutes/Week): 180, Intensity: Mild  Healthy diet Good water intake  Goals    . Follow up with Primary Care Provider     As needed      Depression Screen PHQ 2/9 Scores 06/28/2020 07/06/2019 04/06/2019 12/30/2018 02/11/2018 12/31/2015 11/03/2014  PHQ - 2 Score 0 0 0 0 0 0 0    Fall Risk Fall Risk  06/28/2020 07/06/2019 04/06/2019 12/30/2018 08/04/2018  Falls in the past year? 0 0 0 0 0  Number falls in past yr: 0 0 - 0 -  Injury with Fall? 0 0 - - -  Follow up Falls evaluation completed Falls evaluation completed Falls prevention discussed;Education provided - -    FALL RISK PREVENTION PERTAINING TO THE HOME: Handrails in use when climbing stairs? Yes Home free of loose throw rugs in walkways, pet beds, electrical cords, etc? Yes  Adequate lighting in your home to reduce risk of falls? Yes   ASSISTIVE DEVICES UTILIZED TO PREVENT FALLS: Use of a cane, walker or w/c? No .  TIMED UP AND GO: Was the test performed? No . Virtual visit.   Cognitive Function:  Patient is alert and oriented x3.  Declines difficulty focusing, making decisions, memory loss.  Enjoys brain health activities.    6CIT Screen 06/28/2020 04/06/2019  What Year? 0 points 0 points  What month? 0 points 0 points  What time? 0 points 0 points  Count back from 20 - 0 points  Months in reverse 0 points 0 points  Repeat phrase - 0 points  Total Score - 0    Immunizations Immunization History  Administered Date(s) Administered  . Fluad Quad(high Dose 65+) 01/14/2019  . Influenza Whole 01/10/2013  . Influenza, High Dose Seasonal PF 01/24/2015, 02/18/2016, 02/09/2017, 02/02/2018  . Influenza, Seasonal, Injecte, Preservative Fre 07/11/2014  . Influenza-Unspecified 02/03/2020  . PFIZER(Purple Top)SARS-COV-2 Vaccination 06/16/2019, 07/11/2019, 04/28/2020  . Pneumococcal Conjugate-13  11/03/2014  . Pneumococcal Polysaccharide-23 10/13/2012, 02/02/2018  . Td 10/13/2012  . Zoster 10/23/2012  . Zoster Recombinat (Shingrix) 12/23/2019, 05/24/2020   Health Maintenance Health Maintenance  Topic Date Due  . COVID-19 Vaccine (4 - Booster for Pfizer series) 10/27/2020  . TETANUS/TDAP  10/14/2022  . INFLUENZA VACCINE  Completed  . DEXA SCAN  Completed  . PNA vac Low Risk Adult  Completed   Colorectal cancer screening: No longer required.   Mammogram status: Completed 06/10/19. Repeat every year. Scheduled 07/2020.   Bone density- 06/07/18.  Lung Cancer Screening: (Low Dose CT Chest recommended if Age 44-80 years, 30 pack-year currently smoking OR have quit w/in 15years.) does not qualify.   Hepatitis C Screening: does not qualify.  Vision Screening: Recommended annual ophthalmology exams for early detection of glaucoma and other disorders of the eye. Is the patient up to date with their annual eye exam?  Yes   Dental Screening: Recommended annual dental exams for proper oral hygiene.  Community Resource Referral / Chronic Care Management: CRR required this visit?  No   CCM required this visit?  No      Plan:   Keep all routine maintenance appointments.   I have personally reviewed and noted the following in the patient's chart:   . Medical and social history . Use of alcohol, tobacco or illicit drugs  . Current medications and supplements .  Functional ability and status . Nutritional status . Physical activity . Advanced directives . List of other physicians . Hospitalizations, surgeries, and ER visits in previous 12 months . Vitals . Screenings to include cognitive, depression, and falls . Referrals and appointments  In addition, I have reviewed and discussed with patient certain preventive protocols, quality metrics, and best practice recommendations. A written personalized care plan for preventive services as well as general preventive health  recommendations were provided to patient via mychart.     Varney Biles, LPN   [id number]

## 2020-07-17 ENCOUNTER — Telehealth: Payer: Self-pay | Admitting: Internal Medicine

## 2020-07-17 ENCOUNTER — Other Ambulatory Visit: Payer: Self-pay

## 2020-07-17 DIAGNOSIS — Z1231 Encounter for screening mammogram for malignant neoplasm of breast: Secondary | ICD-10-CM

## 2020-07-17 NOTE — Telephone Encounter (Signed)
Patient called in about a letter for breast exam need a referral

## 2020-07-17 NOTE — Telephone Encounter (Signed)
Pt called and let her know that order was placed. She said that she would call [place] to schedule.

## 2020-07-26 DIAGNOSIS — D692 Other nonthrombocytopenic purpura: Secondary | ICD-10-CM | POA: Diagnosis not present

## 2020-07-26 DIAGNOSIS — L814 Other melanin hyperpigmentation: Secondary | ICD-10-CM | POA: Diagnosis not present

## 2020-07-26 DIAGNOSIS — Z85820 Personal history of malignant melanoma of skin: Secondary | ICD-10-CM | POA: Diagnosis not present

## 2020-07-26 DIAGNOSIS — Z85828 Personal history of other malignant neoplasm of skin: Secondary | ICD-10-CM | POA: Diagnosis not present

## 2020-07-26 DIAGNOSIS — L570 Actinic keratosis: Secondary | ICD-10-CM | POA: Diagnosis not present

## 2020-07-26 DIAGNOSIS — D225 Melanocytic nevi of trunk: Secondary | ICD-10-CM | POA: Diagnosis not present

## 2020-07-26 DIAGNOSIS — L821 Other seborrheic keratosis: Secondary | ICD-10-CM | POA: Diagnosis not present

## 2020-08-08 ENCOUNTER — Ambulatory Visit
Admission: RE | Admit: 2020-08-08 | Discharge: 2020-08-08 | Disposition: A | Discharge status: Home / Self Care | Payer: Medicare Other | Source: Ambulatory Visit | Attending: Internal Medicine | Admitting: Internal Medicine

## 2020-08-08 ENCOUNTER — Other Ambulatory Visit: Payer: Self-pay

## 2020-08-08 DIAGNOSIS — Z1231 Encounter for screening mammogram for malignant neoplasm of breast: Secondary | ICD-10-CM | POA: Diagnosis not present

## 2020-08-09 ENCOUNTER — Other Ambulatory Visit: Payer: Self-pay | Admitting: Internal Medicine

## 2020-08-09 DIAGNOSIS — N6310 Unspecified lump in the right breast, unspecified quadrant: Secondary | ICD-10-CM

## 2020-08-09 DIAGNOSIS — R928 Other abnormal and inconclusive findings on diagnostic imaging of breast: Secondary | ICD-10-CM

## 2020-08-15 ENCOUNTER — Ambulatory Visit
Admission: RE | Admit: 2020-08-15 | Discharge: 2020-08-15 | Disposition: A | Discharge status: Home / Self Care | Payer: Medicare Other | Source: Ambulatory Visit | Attending: Internal Medicine | Admitting: Internal Medicine

## 2020-08-15 ENCOUNTER — Other Ambulatory Visit: Payer: Self-pay

## 2020-08-15 DIAGNOSIS — R928 Other abnormal and inconclusive findings on diagnostic imaging of breast: Secondary | ICD-10-CM | POA: Diagnosis not present

## 2020-08-15 DIAGNOSIS — N6310 Unspecified lump in the right breast, unspecified quadrant: Secondary | ICD-10-CM | POA: Diagnosis not present

## 2020-08-15 DIAGNOSIS — N6311 Unspecified lump in the right breast, upper outer quadrant: Secondary | ICD-10-CM | POA: Diagnosis not present

## 2020-08-15 DIAGNOSIS — N6313 Unspecified lump in the right breast, lower outer quadrant: Secondary | ICD-10-CM | POA: Diagnosis not present

## 2020-08-16 ENCOUNTER — Other Ambulatory Visit: Payer: Self-pay | Admitting: Internal Medicine

## 2020-08-16 DIAGNOSIS — R928 Other abnormal and inconclusive findings on diagnostic imaging of breast: Secondary | ICD-10-CM

## 2020-08-16 DIAGNOSIS — N6310 Unspecified lump in the right breast, unspecified quadrant: Secondary | ICD-10-CM

## 2020-08-22 ENCOUNTER — Other Ambulatory Visit: Payer: Self-pay

## 2020-08-22 ENCOUNTER — Ambulatory Visit
Admission: RE | Admit: 2020-08-22 | Discharge: 2020-08-22 | Disposition: A | Discharge status: Home / Self Care | Payer: Medicare Other | Source: Ambulatory Visit | Attending: Internal Medicine | Admitting: Internal Medicine

## 2020-08-22 DIAGNOSIS — N641 Fat necrosis of breast: Secondary | ICD-10-CM | POA: Diagnosis not present

## 2020-08-22 DIAGNOSIS — N6310 Unspecified lump in the right breast, unspecified quadrant: Secondary | ICD-10-CM | POA: Insufficient documentation

## 2020-08-22 DIAGNOSIS — N6315 Unspecified lump in the right breast, overlapping quadrants: Secondary | ICD-10-CM | POA: Diagnosis not present

## 2020-08-22 DIAGNOSIS — R928 Other abnormal and inconclusive findings on diagnostic imaging of breast: Secondary | ICD-10-CM | POA: Insufficient documentation

## 2020-08-22 HISTORY — PX: BREAST BIOPSY: SHX20

## 2020-08-23 LAB — SURGICAL PATHOLOGY

## 2020-08-28 ENCOUNTER — Other Ambulatory Visit: Payer: Self-pay | Admitting: Internal Medicine

## 2020-08-28 DIAGNOSIS — R928 Other abnormal and inconclusive findings on diagnostic imaging of breast: Secondary | ICD-10-CM

## 2020-08-28 DIAGNOSIS — N6310 Unspecified lump in the right breast, unspecified quadrant: Secondary | ICD-10-CM

## 2020-09-05 ENCOUNTER — Ambulatory Visit
Admission: RE | Admit: 2020-09-05 | Discharge: 2020-09-05 | Disposition: A | Discharge status: Home / Self Care | Payer: Medicare Other | Source: Ambulatory Visit | Attending: Internal Medicine | Admitting: Internal Medicine

## 2020-09-05 ENCOUNTER — Other Ambulatory Visit: Payer: Self-pay

## 2020-09-05 DIAGNOSIS — R928 Other abnormal and inconclusive findings on diagnostic imaging of breast: Secondary | ICD-10-CM | POA: Diagnosis not present

## 2020-09-05 DIAGNOSIS — N6310 Unspecified lump in the right breast, unspecified quadrant: Secondary | ICD-10-CM | POA: Insufficient documentation

## 2020-09-05 DIAGNOSIS — N6313 Unspecified lump in the right breast, lower outer quadrant: Secondary | ICD-10-CM | POA: Diagnosis not present

## 2020-09-05 DIAGNOSIS — Z7689 Persons encountering health services in other specified circumstances: Secondary | ICD-10-CM | POA: Diagnosis not present

## 2020-09-05 HISTORY — PX: BREAST BIOPSY: SHX20

## 2020-09-06 LAB — SURGICAL PATHOLOGY

## 2020-10-01 ENCOUNTER — Ambulatory Visit: Payer: Medicare Other | Admitting: Interventional Cardiology

## 2020-10-10 NOTE — Progress Notes (Signed)
Cardiology Office Note   Date:  10/12/2020   ID:  [name], DOB 08/11/1940, MRN [id number]  PCP:  McLean-Scocuzza, Nino Glow, MD    No chief complaint on file.  AFib  Wt Readings from Last 3 Encounters:  10/12/20 158 lb (71.7 kg)  06/28/20 153 lb (69.4 kg)  12/06/19 153 lb 3.2 oz (69.5 kg)       History of Present Illness: [name] is a 80 y.o. female   who has had a right carotid stent after restenosis post CEA. She saw Dr. Edd Arbour at [facility], now at [facility]. She had been following with him for a while but came to me for local care. SHe has not had heart probelms. She has had a stress test many years ago which was negative. She had a left carotid stent done in Jun 21, 2022.  Her husband passed away in 02-21-23. Daughter lives in [place], and sees my sister in law for a PMD.   She was diagnosed with AFib earlier in 2017. She was asymptomatic. She hadno bleeding problems after starting Eliquis.  She was doing Chief of Staff.  She has a cyst on her kidneys. THis is being followed by CT.   She is also followed by Dr. Fletcher Anon for peripheral arterial disease.  This affects both her lower extremities and her upper extremities.  She has been trying to walk more.  No nonhealing sores.  She plays golf occasionally- 9 holes at a time.   She had her COVID vaccines. She has stayed healthy.  Denies : Chest pain. Dizziness. Leg edema. Nitroglycerin use. Orthopnea. Palpitations. Paroxysmal nocturnal dyspnea. Shortness of breath. Syncope.   She has had some leg pain.  In the mornings, her legs feels well.  By 3PM, she feels her legs tired. She has cut back on her walking, but feels good while walking in the morning.    No sores on her legs or feet.    Reports some claudication after 30 minutes.  Stops for 5 minutes and pain resolves.    Past Medical History:  Diagnosis Date  . Arthritis    hands, knees, ankles  . BCC (basal cell carcinoma of skin) 2016   Stinehelfer  .  Bilateral carotid artery stenosis    a. s/p right carotid stent after restenosis post CEA, left carotid stent (previously on Plavix for these). b. Carotid US 03/2016: stable 40-59% BICA.  Marland Kitchen Fibrocystic breast disease    followed by surgery Jamal Collin)  . GERD (gastroesophageal reflux disease)    OTC prilosec  . Hernia 2005  . History of chicken pox   . History of colon polyps   . History of melanoma 1990s   right shoulder  . HLD (hyperlipidemia)   . HTN (hypertension) 1970s  . Hypertensive retinopathy of both eyes, grade 1 2017   Bulakowski  . Kidney cyst, acquired 2016   large R septated 8cm, stable 2017  . Melanoma in situ ([place]) 2017   R medial posterior arm - lentio maligna type, margin involved ([place])  . PAF (paroxysmal atrial fibrillation) ([place])    a. dx 12/2015 at routine physical.    Past Surgical History:  Procedure Laterality Date  . ABDOMINAL AORTOGRAM W/LOWER EXTREMITY N/A 06/23/2018   Procedure: ABDOMINAL AORTOGRAM W/LOWER EXTREMITY;  Surgeon: Wellington Hampshire, MD;  Location: [facility];  Service: Cardiovascular;  Laterality: N/A;  . ABI  2007   R 1.19, L 1.2  . AORTIC ARCH ANGIOGRAPHY N/A 06/23/2018  Procedure: AORTIC ARCH ANGIOGRAPHY;  Surgeon: Wellington Hampshire, MD;  Location: [facility];  Service: Cardiovascular;  Laterality: N/A;  . APPENDECTOMY  1995  . BREAST BIOPSY Left 1997   left  . BREAST BIOPSY Right 08/22/2020   [place] Bx, Q-clip, neg/ discordant  . BREAST BIOPSY Right 09/05/2020   stereo biopsy/ ribbon clip/ path pending  . BREAST CYST ASPIRATION Left 02/1997   fna  . BREAST EXCISIONAL BIOPSY Right 1998   benign per pt  . CARDIOVASCULAR STRESS TEST  ~2003   WNL  . CAROTID ENDARTERECTOMY  2002   bilateral  . CAROTID STENT Right 2002   right after restenosis post CEA (Dr. Edd Arbour)  . CAROTID STENT  2014   for stenosis  . CAROTID STENT Left 2014   Dr. Ardeth Sportsman, [place]  . CESAREAN SECTION  1968; 1972  . COLONOSCOPY  03/2012   Sankar  - polyp (rec rpt 5 yrs)  . EXPLORATORY LAPAROTOMY  1990s   for GI pain, inflamed lower GI, unrevealing workup, improved with abx  . HERNIA REPAIR  2005   abdomen  . MELANOMA EXCISION  1998   right shoulder  . TOTAL HIP ARTHROPLASTY  2005   bilat     Current Outpatient Medications  Medication Sig Dispense Refill  . Acetaminophen (TYLENOL ARTHRITIS PAIN PO) Take 1 tablet by mouth daily.    Marland Kitchen acetaminophen (TYLENOL) 500 MG tablet Take 500 mg by mouth daily.     Marland Kitchen apixaban (ELIQUIS) 5 MG TABS tablet Take 1 tablet (5 mg total) by mouth 2 (two) times daily. 180 tablet 0  . atorvastatin (LIPITOR) 40 MG tablet TAKE 1 TABLET BY MOUTH AT  BEDTIME 90 tablet 3  . CARTIA XT 120 MG 24 hr capsule TAKE 1 CAPSULE BY MOUTH  DAILY 90 capsule 3  . Cholecalciferol (VITAMIN D-3) 25 MCG (1000 UT) CAPS Take by mouth.    . enalapril (VASOTEC) 5 MG tablet TAKE 1 TABLET BY MOUTH  DAILY 90 tablet 3  . fluorouracil (EFUDEX) 5 % cream as needed.    . fluticasone (FLONASE) 50 MCG/ACT nasal spray Place 2 sprays into both nostrils daily as needed for allergies or rhinitis.    . metoprolol succinate (TOPROL-XL) 100 MG 24 hr tablet TAKE 1 TABLET BY MOUTH  DAILY WITH OR IMMEDIATELY  FOLLOWING A MEAL 90 tablet 3  . Multiple Vitamin (MULTIVITAMIN WITH MINERALS) TABS tablet Take 1 tablet by mouth daily.    Marland Kitchen triamcinolone cream (KENALOG) 0.1 % Apply 1 application topically daily as needed (skin cancer).     No current facility-administered medications for this visit.    Allergies:   Patient has no known allergies.    Social History:  The patient  reports that she has quit smoking. She has a 30.00 pack-year smoking history. She has never used smokeless tobacco. She reports current alcohol use. She reports that she does not use drugs.   Family History:  The patient's family history includes Cancer (age of onset: 17) in her father; Cancer (age of onset: 70) in her mother; Hypertension in her paternal grandfather.     ROS:  Please see the history of present illness.   Otherwise, review of systems are positive for leg pains.   All other systems are reviewed and negative.    PHYSICAL EXAM: VS:  BP 122/82   Pulse 80   Ht 5\' 2"  (1.575 m)   Wt 158 lb (71.7 kg)   SpO2 97%  BMI 28.90 kg/m  , BMI Body mass index is 28.9 kg/m. GEN: Well nourished, well developed, in no acute distress  HEENT: normal  Neck: no JVD, carotid bruits, or masses Cardiac: irregularly irregularly; no murmurs, rubs, or gallops,no edema , difficult to palpate pedal pulses bilaterally Respiratory:  clear to auscultation bilaterally, normal work of breathing GI: soft, nontender, nondistended, + BS MS: no deformity or atrophy  Skin: warm and dry, no rash Neuro:  Strength and sensation are intact Psych: euthymic mood, full affect   EKG:   The ekg ordered today demonstrates AFib, rate controlled   Recent Labs: No results found for requested labs within last 8760 hours.   Lipid Panel    Component Value Date/Time   CHOL 120 07/21/2019 0833   TRIG 90.0 07/21/2019 0833   TRIG 144 03/28/2011 0000   HDL 39.20 07/21/2019 0833   CHOLHDL 3 07/21/2019 0833   VLDL 18.0 07/21/2019 0833   LDLCALC 62 07/21/2019 0833   LDLCALC 76 02/16/2018 0924   LDLDIRECT 78 03/28/2011 0000     Other studies Reviewed: Additional studies/ records that were reviewed today with results demonstrating: 2021 labs reviewed.   ASSESSMENT AND PLAN:  1. Carotid artery disease: Regular ultrasounds will be done. Plan carotid DOppler. Mild claudication sx with known LE arterial disease. Will schedule f/u with Dr. Fletcher Anon.  I encouraged her to continue to walk to build collateral circulation.  2. AFib: Heart rate is well controlled.  We will continue the blood thinner to prevent stroke.  No bleeding issues.  3. Hypertensive heart disease: The current medical regimen is effective;  continue present plan and medications.LDL 62 in 2021. 4. Hyperlipidemia: The  current medical regimen is effective;  continue present plan and medications. 5. Anticoagulated: Eliquis for stroke prevention.   6. FAtigue: Mostly in legs.  Check TSH.    Current medicines are reviewed at length with the patient today.  The patient concerns regarding her medicines were addressed.  The following changes have been made:  No change  Labs/ tests ordered today include:   Orders Placed This Encounter  Procedures  . CBC with Differential/Platelet  . Comprehensive metabolic panel  . Lipid panel  . TSH  . EKG 12-Lead  . VAS US CAROTID    Recommend 150 minutes/week of aerobic exercise Low fat, low carb, high fiber diet recommended  Disposition:   FU in 1 year   Signed, Larae Grooms, MD  10/12/2020 12:27 PM    [facility] [place], [place], [place]  [address] Phone: [phone]; Fax: [phone]

## 2020-10-12 ENCOUNTER — Ambulatory Visit: Payer: Medicare Other | Admitting: Interventional Cardiology

## 2020-10-12 ENCOUNTER — Encounter: Payer: Self-pay | Admitting: Interventional Cardiology

## 2020-10-12 ENCOUNTER — Other Ambulatory Visit: Payer: Self-pay

## 2020-10-12 VITALS — BP 122/82 | HR 80 | Ht 62.0 in | Wt 158.0 lb

## 2020-10-12 DIAGNOSIS — I779 Disorder of arteries and arterioles, unspecified: Secondary | ICD-10-CM

## 2020-10-12 DIAGNOSIS — Z7901 Long term (current) use of anticoagulants: Secondary | ICD-10-CM | POA: Diagnosis not present

## 2020-10-12 DIAGNOSIS — R5383 Other fatigue: Secondary | ICD-10-CM

## 2020-10-12 DIAGNOSIS — I10 Essential (primary) hypertension: Secondary | ICD-10-CM | POA: Diagnosis not present

## 2020-10-12 DIAGNOSIS — E782 Mixed hyperlipidemia: Secondary | ICD-10-CM | POA: Diagnosis not present

## 2020-10-12 DIAGNOSIS — I4821 Permanent atrial fibrillation: Secondary | ICD-10-CM

## 2020-10-12 NOTE — Patient Instructions (Addendum)
Medication Instructions:  Your provider recommends that you continue on your current medications as directed. Please refer to the Current Medication list given to you today.   *If you need a refill on your cardiac medications before your next appointment, please call your pharmacy*  Lab Work: Please proceed to the [facility] in [place] to have your FASTING labs drawn. If you have labs (blood work) drawn today and your tests are completely normal, you will receive your results only by: Marland Kitchen MyChart Message (if you have MyChart) OR . A paper copy in the mail If you have any lab test that is abnormal or we need to change your treatment, we will call you to review the results.  Testing/Procedures: Your physician has requested that you have a carotid duplex. This test is an ultrasound of the carotid arteries in your neck. It looks at blood flow through these arteries that supply the brain with blood. Allow one hour for this exam. There are no restrictions or special instructions.  Follow-Up: Your provider recommends that you schedule a follow-up appointment with Dr. Fletcher Anon in July 2022.  At [facility], you and your health needs are our priority.  As part of our continuing mission to provide you with exceptional heart care, we have created designated Provider Care Teams.  These Care Teams include your primary Cardiologist (physician) and Advanced Practice Providers (APPs -  Physician Assistants and Nurse Practitioners) who all work together to provide you with the care you need, when you need it. Your next appointment:   12 month(s) The format for your next appointment:   In Person Provider:   You may see Larae Grooms, MD or one of the following Advanced Practice Providers on your designated Care Team:    Melina Copa, PA-C  Ermalinda Barrios, PA-C

## 2020-10-16 DIAGNOSIS — I4821 Permanent atrial fibrillation: Secondary | ICD-10-CM | POA: Diagnosis not present

## 2020-10-16 DIAGNOSIS — E782 Mixed hyperlipidemia: Secondary | ICD-10-CM | POA: Diagnosis not present

## 2020-10-16 DIAGNOSIS — Z7901 Long term (current) use of anticoagulants: Secondary | ICD-10-CM | POA: Diagnosis not present

## 2020-10-17 ENCOUNTER — Other Ambulatory Visit: Payer: Self-pay

## 2020-10-17 ENCOUNTER — Other Ambulatory Visit (HOSPITAL_COMMUNITY): Payer: Self-pay | Admitting: Interventional Cardiology

## 2020-10-17 ENCOUNTER — Ambulatory Visit (HOSPITAL_COMMUNITY)
Admission: RE | Admit: 2020-10-17 | Discharge: 2020-10-17 | Disposition: A | Discharge status: Home / Self Care | Payer: Medicare Other | Source: Ambulatory Visit | Attending: Cardiovascular Disease | Admitting: Cardiovascular Disease

## 2020-10-17 DIAGNOSIS — I6523 Occlusion and stenosis of bilateral carotid arteries: Secondary | ICD-10-CM

## 2020-10-17 DIAGNOSIS — Z7901 Long term (current) use of anticoagulants: Secondary | ICD-10-CM | POA: Diagnosis not present

## 2020-10-17 DIAGNOSIS — I779 Disorder of arteries and arterioles, unspecified: Secondary | ICD-10-CM | POA: Insufficient documentation

## 2020-10-17 DIAGNOSIS — I6521 Occlusion and stenosis of right carotid artery: Secondary | ICD-10-CM

## 2020-10-17 DIAGNOSIS — I4821 Permanent atrial fibrillation: Secondary | ICD-10-CM | POA: Diagnosis not present

## 2020-10-17 DIAGNOSIS — E782 Mixed hyperlipidemia: Secondary | ICD-10-CM | POA: Diagnosis not present

## 2020-10-17 LAB — COMPREHENSIVE METABOLIC PANEL
ALT: 19 IU/L (ref 0–32)
AST: 21 IU/L (ref 0–40)
Albumin/Globulin Ratio: 1.2 (ref 1.2–2.2)
Albumin: 4.5 g/dL (ref 3.6–4.6)
Alkaline Phosphatase: 123 IU/L — ABNORMAL HIGH (ref 44–121)
BUN/Creatinine Ratio: 18 (ref 12–28)
BUN: 19 mg/dL (ref 8–27)
Bilirubin Total: 0.8 mg/dL (ref 0.0–1.2)
CO2: 24 mmol/L (ref 20–29)
Calcium: 9.6 mg/dL (ref 8.7–10.3)
Chloride: 100 mmol/L (ref 96–106)
Creatinine, Ser: 1.04 mg/dL — ABNORMAL HIGH (ref 0.57–1.00)
Globulin, Total: 3.8 g/dL (ref 1.5–4.5)
Glucose: 108 mg/dL — ABNORMAL HIGH (ref 65–99)
Potassium: 4.7 mmol/L (ref 3.5–5.2)
Sodium: 141 mmol/L (ref 134–144)
Total Protein: 8.3 g/dL (ref 6.0–8.5)
eGFR: 54 mL/min/{1.73_m2} — ABNORMAL LOW (ref >59)

## 2020-10-17 LAB — CBC WITH DIFFERENTIAL/PLATELET
Basophils Absolute: 0 10*3/uL (ref 0.0–0.2)
Basos: 1 %
EOS (ABSOLUTE): 0.1 10*3/uL (ref 0.0–0.4)
Eos: 2 %
Hematocrit: 48.7 % — ABNORMAL HIGH (ref 34.0–46.6)
Hemoglobin: 16.4 g/dL — ABNORMAL HIGH (ref 11.1–15.9)
Immature Grans (Abs): 0 10*3/uL (ref 0.0–0.1)
Immature Granulocytes: 1 %
Lymphocytes Absolute: 1.4 10*3/uL (ref 0.7–3.1)
Lymphs: 30 %
MCH: 32.3 pg (ref 26.6–33.0)
MCHC: 33.7 g/dL (ref 31.5–35.7)
MCV: 96 fL (ref 79–97)
Monocytes Absolute: 0.5 10*3/uL (ref 0.1–0.9)
Monocytes: 11 %
Neutrophils Absolute: 2.6 10*3/uL (ref 1.4–7.0)
Neutrophils: 55 %
Platelets: 179 10*3/uL (ref 150–450)
RBC: 5.08 x10E6/uL (ref 3.77–5.28)
RDW: 13.1 % (ref 11.7–15.4)
WBC: 4.7 10*3/uL (ref 3.4–10.8)

## 2020-10-17 LAB — LIPID PANEL
Chol/HDL Ratio: 3.1 ratio (ref 0.0–4.4)
Cholesterol, Total: 147 mg/dL (ref 100–199)
HDL: 47 mg/dL (ref >39)
LDL Chol Calc (NIH): 81 mg/dL (ref 0–99)
Triglycerides: 106 mg/dL (ref 0–149)
VLDL Cholesterol Cal: 19 mg/dL (ref 5–40)

## 2020-10-17 LAB — TSH: TSH: 2.71 u[IU]/mL (ref 0.450–4.500)

## 2020-11-14 ENCOUNTER — Other Ambulatory Visit: Payer: Self-pay | Admitting: Cardiovascular Disease

## 2020-11-15 NOTE — Telephone Encounter (Signed)
Refill Request.  

## 2020-11-15 NOTE — Telephone Encounter (Signed)
Prescription refill request for Eliquis received. Indication: afib  Last office visit: Varanasi, 10/12/2020 Scr: 1.04, 10/16/2020 Age: 80 yo  Weight: 71.7 kg   Pt is on the correct dose of Eliquis per dosing criteria, prescription refill sent for Eliquis 5mg  BID.

## 2020-11-16 ENCOUNTER — Encounter: Payer: Self-pay | Admitting: Emergency Medicine

## 2020-11-16 ENCOUNTER — Ambulatory Visit
Admission: EM | Admit: 2020-11-16 | Discharge: 2020-11-16 | Disposition: A | Discharge status: Home / Self Care | Payer: Medicare Other | Attending: Emergency Medicine | Admitting: Emergency Medicine

## 2020-11-16 ENCOUNTER — Other Ambulatory Visit: Payer: Self-pay

## 2020-11-16 DIAGNOSIS — J209 Acute bronchitis, unspecified: Secondary | ICD-10-CM | POA: Diagnosis not present

## 2020-11-16 DIAGNOSIS — J189 Pneumonia, unspecified organism: Secondary | ICD-10-CM

## 2020-11-16 MED ORDER — AMOXICILLIN-POT CLAVULANATE 875-125 MG PO TABS: 1.0000 | ORAL_TABLET | Freq: Two times a day (BID) | ORAL | 0 refills | Status: DC

## 2020-11-16 MED ORDER — AZITHROMYCIN 250 MG PO TABS: 250.0000 mg | ORAL_TABLET | Freq: Every day | ORAL | 0 refills | Status: DC

## 2020-11-16 NOTE — Discharge Instructions (Addendum)
Go to the emergency department if you have acute shortness of breath or other concerning symptoms.    Take the Zithromax and Augmentin as directed.  Schedule a follow-up appointment with your primary care provider or return here on Monday.

## 2020-11-16 NOTE — ED Provider Notes (Signed)
Roderic Palau    CSN: [id number] Arrival date & time: 11/16/20  1510      History   Chief Complaint Chief Complaint  Patient presents with   Cough   Nasal Congestion   Facial Pain    HPI [name] is a 80 y.o. female.  Patient presents with 2-day history of congestion, sinus pressure, cough.  Negative COVID test at home.  Treatment attempted with Tylenol.  She denies fever, chills, rash, shortness of breath, or other symptoms.  Her medical history includes hypertension, CAD, CKD, A. fib, prediabetes.  The history is provided by the patient and medical records.   Past Medical History:  Diagnosis Date   Arthritis    hands, knees, ankles   BCC (basal cell carcinoma of skin) 2016   [place]   Bilateral carotid artery stenosis    a. s/p right carotid stent after restenosis post CEA, left carotid stent (previously on Plavix for these). b. Carotid US 03/2016: stable 40-59% BICA.   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 dx 12/2015 at routine physical.    Patient Active Problem List   Diagnosis Date Noted   Prediabetes 04/26/2018   History of skin cancer 02/12/2018   Hypertensive heart disease 04/18/2016   Aortic atherosclerosis ([place]) 01/26/2016   Polycythemia 01/23/2016   A-fib ([place]) 12/31/2015   Melanoma in situ ([place])    Incisional hernia, without obstruction or gangrene 05/28/2015   Renal cyst, acquired, right 05/21/2015    Advanced care planning/counseling discussion 11/03/2014   Hepatomegaly 11/03/2014   Fibrocystic breast 05/24/2014   History of melanoma 05/24/2014   Joint pain 03/20/2014   Health care maintenance 10/25/2013   Medicare annual wellness visit, subsequent 10/13/2012   CKD (chronic kidney disease) stage 3, GFR 30-59 ml/min (HCC) 10/13/2012   GERD (gastroesophageal reflux disease)    HTN (hypertension)    HLD (hyperlipidemia)    Bilateral carotid artery disease (HCC)    Arthritis     Past Surgical History:  Procedure Laterality Date   ABDOMINAL AORTOGRAM W/LOWER EXTREMITY N/A 06/23/2018   Procedure: ABDOMINAL AORTOGRAM W/LOWER EXTREMITY;  Surgeon: Wellington Hampshire, MD;  Location: [facility];  Service: Cardiovascular;  Laterality: N/A;   ABI  2007   R 1.19, L 1.2   AORTIC ARCH ANGIOGRAPHY N/A 06/23/2018   Procedure: AORTIC ARCH ANGIOGRAPHY;  Surgeon: Wellington Hampshire, MD;  Location: [facility];  Service: Cardiovascular;  Laterality: N/A;   APPENDECTOMY  1995   BREAST BIOPSY Left 1997   left   BREAST BIOPSY Right 08/22/2020   [place] Bx, Q-clip, neg/ discordant   BREAST BIOPSY Right 09/05/2020   stereo biopsy/ ribbon clip/ path pending   BREAST CYST ASPIRATION Left 02/1997   fna   BREAST EXCISIONAL BIOPSY Right 1998   benign per pt   CARDIOVASCULAR STRESS TEST  ~  2003   WNL   CAROTID ENDARTERECTOMY  2002   bilateral   CAROTID STENT Right 2002   right after restenosis post CEA (Dr. Edd Arbour)   CAROTID STENT  2014   for stenosis   CAROTID STENT Left 2014   Dr. Ardeth Sportsman, [place]; 1972   COLONOSCOPY  03/2012   Jamal Collin - polyp (rec rpt 5 yrs)   [place]   for GI pain, inflamed lower GI, unrevealing workup, improved with abx   HERNIA REPAIR  2005   abdomen   MELANOMA EXCISION  1998   right shoulder   TOTAL HIP ARTHROPLASTY  2005   bilat    OB History     Gravida  2   Para      Term      Preterm      AB      Living   2      SAB      IAB      Ectopic      Multiple      Live Births           Obstetric Comments  Age with first menstruation-14 Age with first pregnancy-28 LMP-age 17          Home Medications    Prior to Admission medications   Medication Sig Start Date End Date Taking? Authorizing Provider  acetaminophen (TYLENOL) 500 MG tablet Take 500 mg by mouth daily.    Yes [provider]  amoxicillin-clavulanate (AUGMENTIN) 875-125 MG tablet Take 1 tablet by mouth every 12 (twelve) hours. 11/16/20  Yes Sharion Balloon, NP  apixaban (ELIQUIS) 5 MG TABS tablet TAKE 1 TABLET BY MOUTH  TWICE DAILY 11/15/20  Yes Wellington Hampshire, MD  atorvastatin (LIPITOR) 40 MG tablet TAKE 1 TABLET BY MOUTH AT  BEDTIME 01/12/20  Yes Imogene Burn, PA-C  azithromycin (ZITHROMAX) 250 MG tablet Take 1 tablet (250 mg total) by mouth daily. Take first 2 tablets together, then 1 every day until finished. 11/16/20  Yes Sharion Balloon, NP  CARTIA XT 120 MG 24 hr capsule TAKE 1 CAPSULE BY MOUTH  DAILY 01/12/20  Yes Imogene Burn, PA-C  Cholecalciferol (VITAMIN D-3) 25 MCG (1000 UT) CAPS Take by mouth.   Yes [provider]  enalapril (VASOTEC) 5 MG tablet TAKE 1 TABLET BY MOUTH  DAILY 01/12/20  Yes Imogene Burn, PA-C  metoprolol succinate (TOPROL-XL) 100 MG 24 hr tablet TAKE 1 TABLET BY MOUTH  DAILY WITH OR IMMEDIATELY  FOLLOWING A MEAL 01/12/20  Yes Imogene Burn, PA-C  Acetaminophen (TYLENOL ARTHRITIS PAIN PO) Take 1 tablet by mouth daily.    [provider]  fluorouracil (EFUDEX) 5 % cream as needed.    [provider]  fluticasone (FLONASE) 50 MCG/ACT nasal spray Place 2 sprays into both nostrils daily as needed for allergies or rhinitis.    [provider]  Multiple Vitamin (MULTIVITAMIN WITH MINERALS) TABS tablet Take 1 tablet by mouth daily.    [provider]  triamcinolone cream (KENALOG) 0.1 % Apply 1 application topically daily as needed (skin cancer).     [provider]    Family History Family History  Problem Relation Age of Onset   Cancer Mother 65       lung, smoker   Cancer Father 25       prostate   Hypertension Paternal Grandfather    CAD Neg Hx  Stroke Neg Hx    Diabetes Neg Hx    Heart attack Neg Hx    Breast cancer Neg Hx     Social History Social History   Tobacco Use   Smoking status: Former    Packs/day: 1.00    Years: 30.00    Pack years: 30.00    Types: Cigarettes   Smokeless tobacco: Never  Vaping Use   Vaping Use: Never used  Substance Use Topics   Alcohol use: Yes    Alcohol/week: 0.0 standard drinks    Comment: 1 glass wine daily   Drug use: No     Allergies   Patient has no known allergies.   Review of Systems Review of Systems  Constitutional:  Negative for chills and fever.  HENT:  Positive for congestion and sinus pressure. Negative for ear pain and sore throat.   Respiratory:  Positive for cough. Negative for shortness of breath.   Cardiovascular:  Negative for chest pain and palpitations.  Gastrointestinal:  Negative for abdominal pain and vomiting.  Skin:  Negative for color change and rash.  All other systems reviewed and are negative.   Physical Exam Triage Vital Signs ED Triage Vitals  Enc Vitals Group     BP      Pulse      Resp      Temp      Temp src      SpO2      Weight      Height      Head Circumference      Peak Flow      Pain Score      Pain Loc      Pain Edu?      Excl. in [place]?    No data found.  Updated Vital Signs BP 139/78 (BP Location: Left Arm)   Pulse 91   Temp 98.7 F (37.1 C) (Oral)   Resp (!) 24   SpO2 92%   Visual Acuity Right Eye Distance:   Left Eye Distance:   Bilateral Distance:    Right Eye Near:   Left Eye Near:    Bilateral Near:     Physical Exam Vitals and nursing note reviewed.  Constitutional:      General: She is not in acute distress.    Appearance: She is well-developed. She is not ill-appearing.   HENT:     Head: Normocephalic and atraumatic.     Right Ear: Tympanic membrane normal.     Left Ear: Tympanic membrane normal.     Nose: Nose normal.     Mouth/Throat:     Mouth: Mucous membranes are moist.     Pharynx: Oropharynx is clear.  Eyes:     Conjunctiva/sclera: Conjunctivae normal.  Cardiovascular:     Rate and Rhythm: Normal rate and regular rhythm.     Heart sounds: Normal heart sounds.  Pulmonary:     Effort: Pulmonary effort is normal. No respiratory distress.     Breath sounds: Rhonchi present.     Comments: Bilateral scattered rhonchi. Abdominal:     Palpations: Abdomen is soft.     Tenderness: There is no abdominal tenderness.  Musculoskeletal:     Cervical back: Neck supple.  Skin:    General: Skin is warm and dry.  Neurological:     General: No focal deficit present.     Mental Status: She is alert and oriented to person, place, and time.     Gait: Gait normal.  Psychiatric:        Mood and Affect: Mood normal.        Behavior: Behavior normal.     UC Treatments / Results  Labs (all labs ordered are listed, but only abnormal results are displayed) Labs Reviewed  NOVEL CORONAVIRUS, NAA    EKG   Radiology No results found.  Procedures Procedures (including critical care time)  Medications Ordered in UC Medications - No data to display  Initial Impression / Assessment and Plan / UC Course  I have reviewed the triage vital signs and the nursing notes.  Pertinent labs & imaging results that were available during my care of the patient were reviewed by me and considered in my medical decision making (see chart for details).  Pneumonia.  Patient adamantly declines transfer to the ED.  I discussed my concerns about her lung sounds and oxygen level; that I am concerned for COVID pneumonia.  Discussed limitations of being seen in an urgent care setting, including no x-ray available at this time and COVID test results taking 2 -3 days to return.   Patient continues to refuse transfer to the ED.  She is alert and oriented.  Treating with Augmentin and Zithromax.  Strict ED precautions discussed at length.  Also instructed her to follow-up with her PCP or return here on Monday for a recheck.  She agrees to plan of care.   Final Clinical Impressions(s) / UC Diagnoses   Final diagnoses:  Pneumonia of both lungs due to infectious organism, unspecified part of lung     Discharge Instructions      Go to the emergency department if you have acute shortness of breath or other concerning symptoms.    Take the Zithromax and Augmentin as directed.  Schedule a follow-up appointment with your primary care provider or return here on Monday.           ED Prescriptions     Medication Sig Dispense Auth. Provider   azithromycin (ZITHROMAX) 250 MG tablet Take 1 tablet (250 mg total) by mouth daily. Take first 2 tablets together, then 1 every day until finished. 6 tablet Sharion Balloon, NP   amoxicillin-clavulanate (AUGMENTIN) 875-125 MG tablet Take 1 tablet by mouth every 12 (twelve) hours. 14 tablet Sharion Balloon, NP      PDMP not reviewed this encounter.   Sharion Balloon, NP 11/16/20 [id number]

## 2020-11-16 NOTE — ED Triage Notes (Signed)
Patient c/o nonproductive cough, nasal congestion , and sinus pressure x 2 days.   Patient denies fever at home.   Patient states " when I cough I feel like some stuff gets stuck in my throat".   Patient has taken a COVID test at home with negative test result.   Patient denies SOB or Chest pain.   Patient has taken Tylenol with some relief of symptoms.

## 2020-11-16 NOTE — ED Notes (Signed)
Claiborne Billings NP instructed patient to go to ED. Patient refused.

## 2020-11-17 IMAGING — DX DG CHEST 2V
2 series · 2 of 2 positions shown · non-contrast
Comparison: January 24, 2016

CLINICAL DATA: Polycythemia [place].  Hypertension.

EXAM:
CHEST - 2 VIEW

[chest pa]
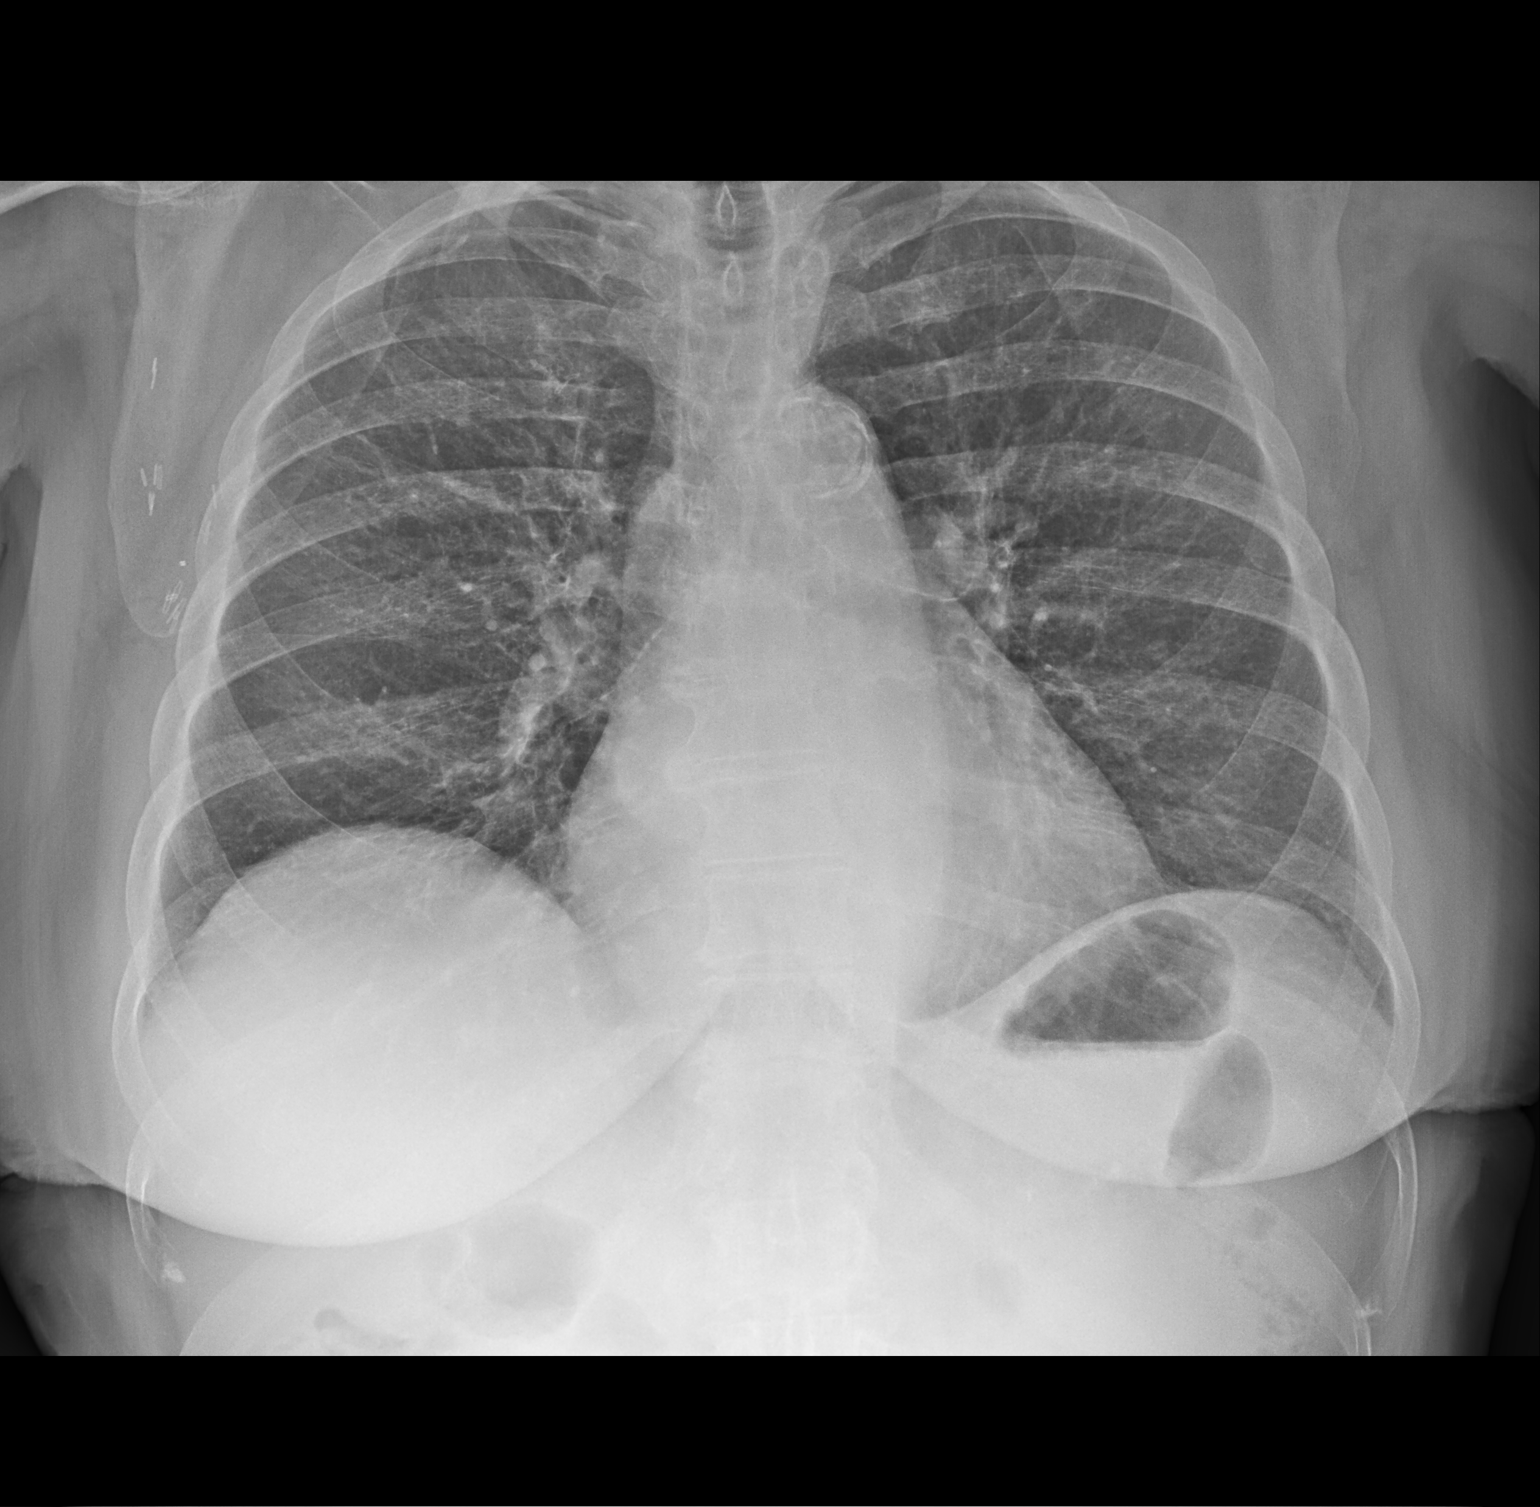

[chest lat]
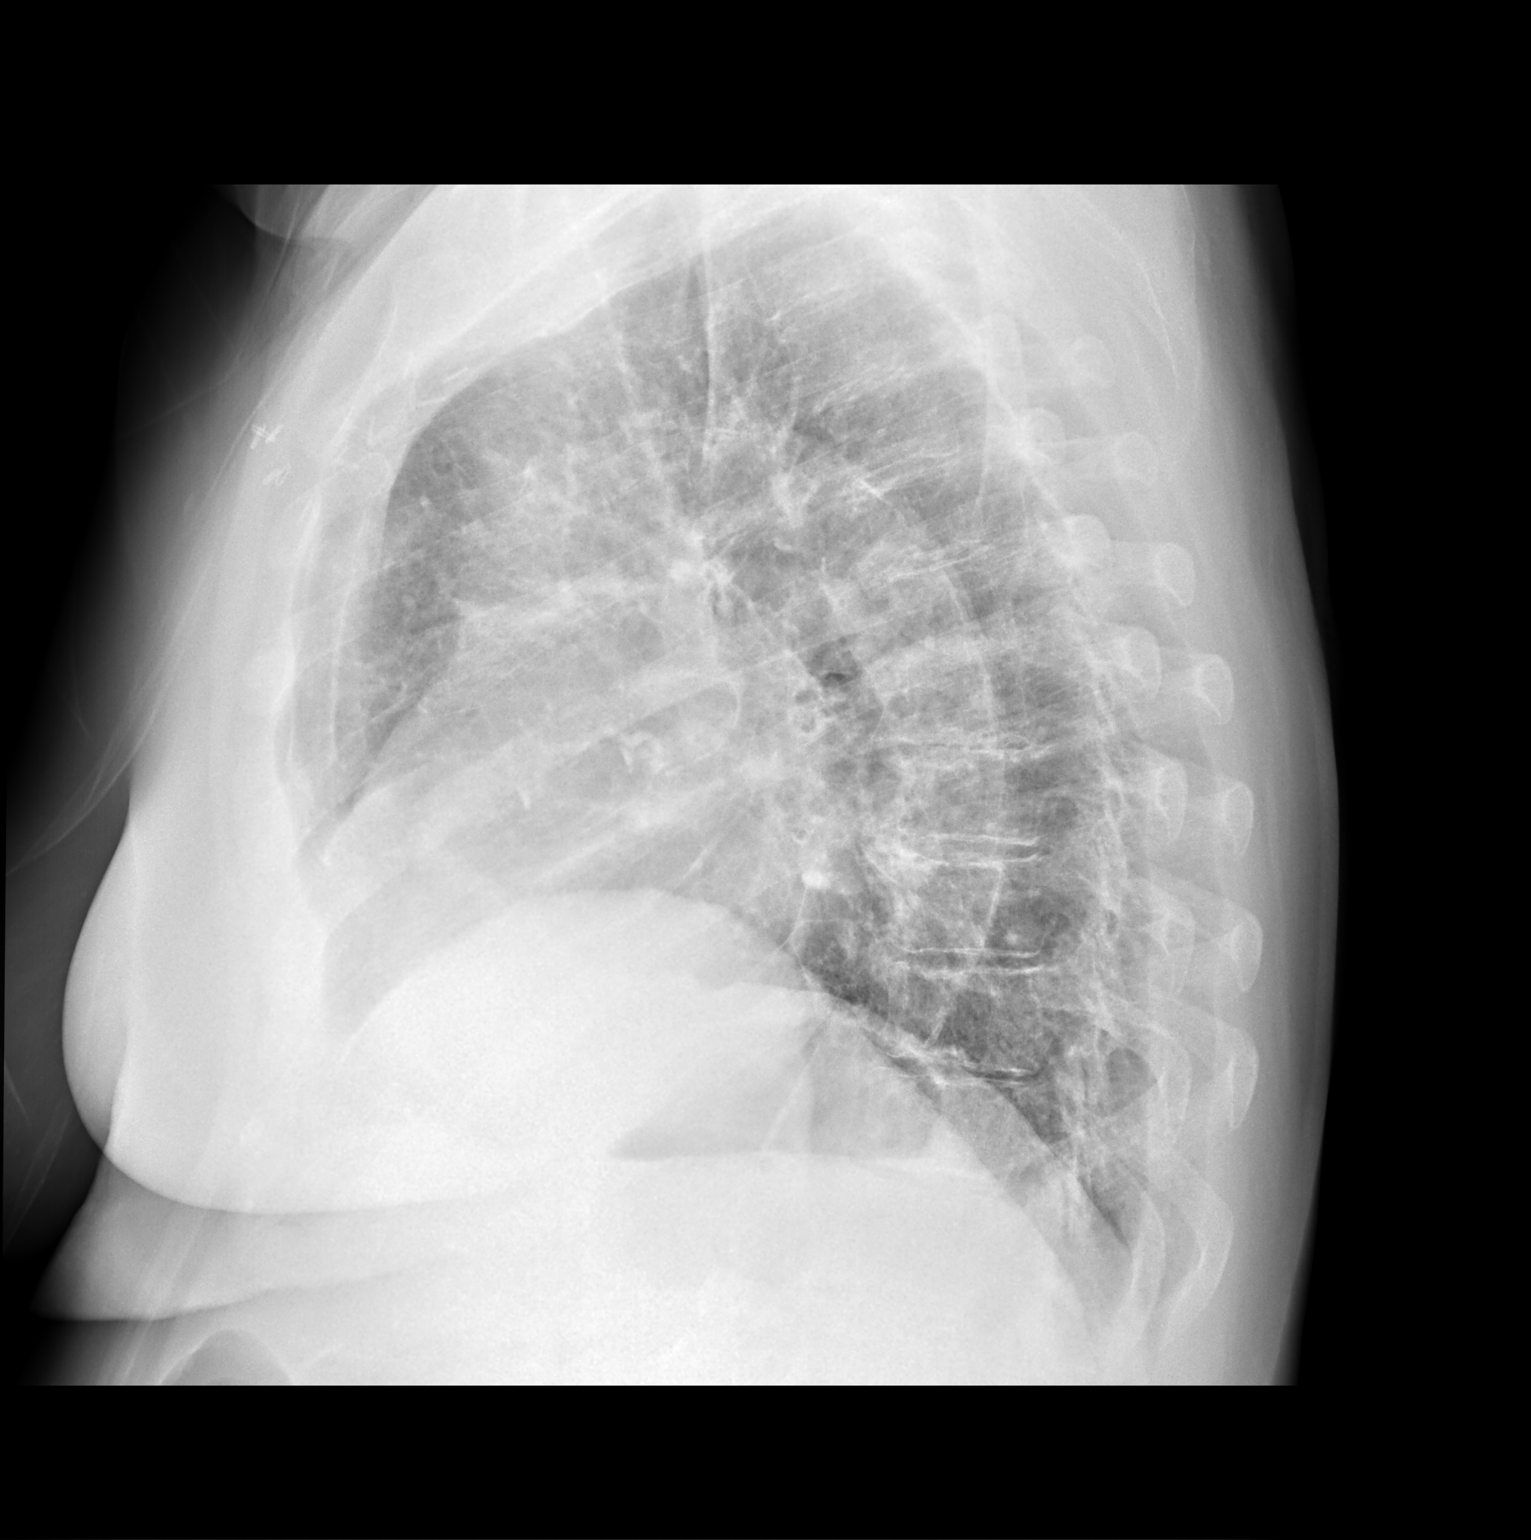

[2 of 2 positions shown; findings below may reference images not displayed]

FINDINGS: There is no appreciable edema or consolidation. The heart size and
pulmonary vascularity are within normal limits. No adenopathy. There
is aortic atherosclerosis. No evident bone lesions. There are
surgical clips in the right axillary region.
IMPRESSION: No edema or consolidation. No evident adenopathy. There is aortic
atherosclerosis.

Aortic Atherosclerosis (520K2-X9H.H).

## 2020-11-18 LAB — NOVEL CORONAVIRUS, NAA: SARS-CoV-2, NAA: NOT DETECTED

## 2020-11-18 LAB — SARS-COV-2, NAA 2 DAY TAT

## 2020-11-19 ENCOUNTER — Ambulatory Visit
Admission: EM | Admit: 2020-11-19 | Discharge: 2020-11-19 | Disposition: A | Discharge status: Home / Self Care | Payer: Medicare Other | Attending: Emergency Medicine | Admitting: Emergency Medicine

## 2020-11-19 ENCOUNTER — Ambulatory Visit (INDEPENDENT_AMBULATORY_CARE_PROVIDER_SITE_OTHER): Payer: Medicare Other

## 2020-11-19 ENCOUNTER — Other Ambulatory Visit: Payer: Self-pay

## 2020-11-19 ENCOUNTER — Encounter: Payer: Self-pay | Admitting: Emergency Medicine

## 2020-11-19 DIAGNOSIS — R0602 Shortness of breath: Secondary | ICD-10-CM | POA: Diagnosis not present

## 2020-11-19 DIAGNOSIS — J189 Pneumonia, unspecified organism: Secondary | ICD-10-CM

## 2020-11-19 NOTE — ED Triage Notes (Signed)
Patient presenting for reevaluation.   Patient endorses improvement of SOB symptoms that started 4 days ago.   Patient endorses productive cough w/ " clear"mucus.  Patient present for Chest X-Ray per providers advice from previous visit.  Patient has labored breathing upon assessment by this Probation officer.   Patient was prescribed antibiotics and reports improvement of symptoms during duration of medication regimen.

## 2020-11-19 NOTE — ED Provider Notes (Signed)
Roderic Palau    CSN: [id number] Arrival date & time: 11/19/20  1005      History   Chief Complaint Chief Complaint  Patient presents with   Follow-up     HPI [name] is a 80 y.o. female.  Patient presents with ongoing but improved cough and shortness of breath.  She was seen here on 11/16/2020; diagnosed with pneumonia; she declined transfer to the ED at that time; treated with Zithromax and Augmentin.  She states she her cough and shortness of breath have improved since starting the antibiotics.  She states she was able to walk her dog 3 times yesterday and sleep well last night.  She denies fever, chills, or other symptoms.  The history is provided by the patient and medical records.   Past Medical History:  Diagnosis Date   Arthritis    hands, knees, ankles   BCC (basal cell carcinoma of skin) 2016   Stinehelfer   Bilateral carotid artery stenosis    a. s/p right carotid stent after restenosis post CEA, left carotid stent (previously on Plavix for these). b. Carotid US 03/2016: stable 40-59% BICA.   Fibrocystic breast disease    followed by surgery Jamal Collin)   GERD (gastroesophageal reflux disease)    OTC prilosec   Hernia 2005   History of chicken pox    History of colon polyps    History of melanoma 1990s   right shoulder   HLD (hyperlipidemia)    HTN (hypertension) 1970s   Hypertensive retinopathy of both eyes, grade 1 2017   Bulakowski   Kidney cyst, acquired 2016   large R septated 8cm, stable 2017   Melanoma in situ ([place]) 2017   R medial posterior arm - lentio maligna type, margin involved (Stinehelfer)   PAF (paroxysmal atrial fibrillation) ([place])    a. dx 12/2015 at routine physical.    Patient Active Problem List   Diagnosis Date Noted   Prediabetes 04/26/2018   History of skin cancer 02/12/2018   Hypertensive heart disease 04/18/2016   Aortic atherosclerosis ([place]) 01/26/2016   Polycythemia 01/23/2016   A-fib ([place]) 12/31/2015    Melanoma in situ ([place])    Incisional hernia, without obstruction or gangrene 05/28/2015   Renal cyst, acquired, right 05/21/2015   Advanced care planning/counseling discussion 11/03/2014   Hepatomegaly 11/03/2014   Fibrocystic breast 05/24/2014   History of melanoma 05/24/2014   Joint pain 03/20/2014   Health care maintenance 10/25/2013   Medicare annual wellness visit, subsequent 10/13/2012   CKD (chronic kidney disease) stage 3, GFR 30-59 ml/min (HCC) 10/13/2012   GERD (gastroesophageal reflux disease)    HTN (hypertension)    HLD (hyperlipidemia)    Bilateral carotid artery disease (HCC)    Arthritis     Past Surgical History:  Procedure Laterality Date   ABDOMINAL AORTOGRAM W/LOWER EXTREMITY N/A 06/23/2018   Procedure: ABDOMINAL AORTOGRAM W/LOWER EXTREMITY;  Surgeon: Wellington Hampshire, MD;  Location: [facility];  Service: Cardiovascular;  Laterality: N/A;   ABI  2007   R 1.19, L 1.2   AORTIC ARCH ANGIOGRAPHY N/A 06/23/2018   Procedure: AORTIC ARCH ANGIOGRAPHY;  Surgeon: Wellington Hampshire, MD;  Location: [facility];  Service: Cardiovascular;  Laterality: N/A;   APPENDECTOMY  1995   BREAST BIOPSY Left 1997   left   BREAST BIOPSY Right 08/22/2020   [place] Bx, Q-clip, neg/ discordant   BREAST BIOPSY Right 09/05/2020   stereo biopsy/ ribbon clip/ path pending  BREAST CYST ASPIRATION Left 02/1997   fna   BREAST EXCISIONAL BIOPSY Right 1998   benign per pt   CARDIOVASCULAR STRESS TEST  ~2003   WNL   CAROTID ENDARTERECTOMY  2002   bilateral   CAROTID STENT Right 2002   right after restenosis post CEA (Dr. Edd Arbour)   CAROTID STENT  2014   for stenosis   CAROTID STENT Left 2014   Dr. Ardeth Sportsman, [place]; 1972   COLONOSCOPY  03/2012   Jamal Collin - polyp (rec rpt 5 yrs)   [place]   for GI pain, inflamed lower GI, unrevealing workup, improved with abx   HERNIA REPAIR  2005   abdomen   MELANOMA EXCISION  1998   right shoulder    TOTAL HIP ARTHROPLASTY  2005   bilat    OB History     Gravida  2   Para      Term      Preterm      AB      Living  2      SAB      IAB      Ectopic      Multiple      Live Births           Obstetric Comments  Age with first menstruation-14 Age with first pregnancy-28 LMP-age 76          Home Medications    Prior to Admission medications   Medication Sig Start Date End Date Taking? Authorizing Provider  amoxicillin-clavulanate (AUGMENTIN) 875-125 MG tablet Take 1 tablet by mouth every 12 (twelve) hours. 11/16/20  Yes Sharion Balloon, NP  apixaban (ELIQUIS) 5 MG TABS tablet TAKE 1 TABLET BY MOUTH  TWICE DAILY 11/15/20  Yes Wellington Hampshire, MD  atorvastatin (LIPITOR) 40 MG tablet TAKE 1 TABLET BY MOUTH AT  BEDTIME 01/12/20  Yes Imogene Burn, PA-C  azithromycin (ZITHROMAX) 250 MG tablet Take 1 tablet (250 mg total) by mouth daily. Take first 2 tablets together, then 1 every day until finished. 11/16/20  Yes Sharion Balloon, NP  CARTIA XT 120 MG 24 hr capsule TAKE 1 CAPSULE BY MOUTH  DAILY 01/12/20  Yes Imogene Burn, PA-C  Cholecalciferol (VITAMIN D-3) 25 MCG (1000 UT) CAPS Take by mouth.   Yes [provider]  enalapril (VASOTEC) 5 MG tablet TAKE 1 TABLET BY MOUTH  DAILY 01/12/20  Yes Imogene Burn, PA-C  metoprolol succinate (TOPROL-XL) 100 MG 24 hr tablet TAKE 1 TABLET BY MOUTH  DAILY WITH OR IMMEDIATELY  FOLLOWING A MEAL 01/12/20  Yes Imogene Burn, PA-C  Multiple Vitamin (MULTIVITAMIN WITH MINERALS) TABS tablet Take 1 tablet by mouth daily.   Yes [provider]  Acetaminophen (TYLENOL ARTHRITIS PAIN PO) Take 1 tablet by mouth daily.    [provider]  acetaminophen (TYLENOL) 500 MG tablet Take 500 mg by mouth daily.     [provider]  fluorouracil (EFUDEX) 5 % cream as needed.    [provider]  fluticasone (FLONASE) 50 MCG/ACT nasal spray Place 2 sprays into both nostrils daily as needed for allergies or  rhinitis.    [provider]  triamcinolone cream (KENALOG) 0.1 % Apply 1 application topically daily as needed (skin cancer).    [provider]    Family History Family History  Problem Relation Age of Onset   Cancer Mother 4  lung, smoker   Cancer Father 81       prostate   Hypertension Paternal Grandfather    CAD Neg Hx    Stroke Neg Hx    Diabetes Neg Hx    Heart attack Neg Hx    Breast cancer Neg Hx     Social History Social History   Tobacco Use   Smoking status: Former    Packs/day: 1.00    Years: 30.00    Pack years: 30.00    Types: Cigarettes   Smokeless tobacco: Never  Vaping Use   Vaping Use: Never used  Substance Use Topics   Alcohol use: Yes    Alcohol/week: 0.0 standard drinks    Comment: 1 glass wine daily   Drug use: No     Allergies   Patient has no known allergies.   Review of Systems Review of Systems  Constitutional:  Negative for chills and fever.  HENT:  Negative for ear pain and sore throat.   Respiratory:  Positive for cough and shortness of breath.   Cardiovascular:  Negative for chest pain and palpitations.  Gastrointestinal:  Negative for abdominal pain and vomiting.  Skin:  Negative for color change and rash.  All other systems reviewed and are negative.   Physical Exam Triage Vital Signs ED Triage Vitals  Enc Vitals Group     BP      Pulse      Resp      Temp      Temp src      SpO2      Weight      Height      Head Circumference      Peak Flow      Pain Score      Pain Loc      Pain Edu?      Excl. in [place]?    No data found.  Updated Vital Signs BP (!) 156/77 (BP Location: Left Arm)   Pulse 89   Temp 98 F (36.7 C) (Oral)   Resp (!) 23   SpO2 90%   Visual Acuity Right Eye Distance:   Left Eye Distance:   Bilateral Distance:    Right Eye Near:   Left Eye Near:    Bilateral Near:     Physical Exam Vitals and nursing note reviewed.  Constitutional:      General: She is not  in acute distress.    Appearance: She is well-developed. She is not ill-appearing.  HENT:     Head: Normocephalic and atraumatic.     Mouth/Throat:     Mouth: Mucous membranes are moist.  Eyes:     Conjunctiva/sclera: Conjunctivae normal.  Cardiovascular:     Rate and Rhythm: Normal rate and regular rhythm.     Heart sounds: Normal heart sounds.  Pulmonary:     Effort: Pulmonary effort is normal. No respiratory distress.     Breath sounds: Rhonchi present.     Comments: Few scattered rhonchi throughout.  Improved from previous exam on 11/16/2020. Abdominal:     Palpations: Abdomen is soft.     Tenderness: There is no abdominal tenderness.  Musculoskeletal:     Cervical back: Neck supple.  Skin:    General: Skin is warm and dry.  Neurological:     General: No focal deficit present.     Mental Status: She is alert and oriented to person, place, and time.     Gait: Gait normal.  Psychiatric:  Mood and Affect: Mood normal.        Behavior: Behavior normal.     UC Treatments / Results  Labs (all labs ordered are listed, but only abnormal results are displayed) Labs Reviewed - No data to display  EKG   Radiology DG Chest 2 View  Result Date: 11/19/2020 CLINICAL DATA:  Shortness of breath.  Labored breathing. EXAM: CHEST - 2 VIEW COMPARISON:  04/23/2018 FINDINGS: Low lung volumes. Interstitial markings are diffusely coarsened with chronic features. The lungs are clear without focal pneumonia, edema, pneumothorax or pleural effusion. The cardiopericardial silhouette is within normal limits for size. The visualized bony structures of the thorax show no acute abnormality. Thoracic aortic atherosclerosis evident. IMPRESSION: No active cardiopulmonary disease. Aortic Atherosclerois (ICD10-170.0) Electronically Signed   By: Misty Stanley M.D.   On: 11/19/2020 10:45    Procedures Procedures (including critical care time)  Medications Ordered in UC Medications - No data to  display  Initial Impression / Assessment and Plan / UC Course  I have reviewed the triage vital signs and the nursing notes.  Pertinent labs & imaging results that were available during my care of the patient were reviewed by me and considered in my medical decision making (see chart for details).   Pneumonia.  CXR negative.  COVID negative.  Instructed patient to continue the Zithromax and Augmentin.  Instructed her to follow-up with her PCP in 2 to 3 days for recheck.  She agrees to plan of care.   Final Clinical Impressions(s) / UC Diagnoses   Final diagnoses:  Pneumonia of both lungs due to infectious organism, unspecified part of lung     Discharge Instructions      Continue the antibiotics as directed.  Schedule an appointment with your primary care provider for a recheck in 2-3 days.         ED Prescriptions   None    PDMP not reviewed this encounter.   Sharion Balloon, NP 11/19/20 1056

## 2020-11-19 NOTE — Discharge Instructions (Addendum)
Continue the antibiotics as directed.  Schedule an appointment with your primary care provider for a recheck in 2-3 days.

## 2020-11-27 ENCOUNTER — Ambulatory Visit: Payer: Medicare Other | Admitting: Cardiovascular Disease

## 2020-11-27 ENCOUNTER — Other Ambulatory Visit: Payer: Self-pay

## 2020-11-27 ENCOUNTER — Encounter: Payer: Self-pay | Admitting: Cardiovascular Disease

## 2020-11-27 ENCOUNTER — Other Ambulatory Visit: Payer: Self-pay | Admitting: Cardiovascular Disease

## 2020-11-27 VITALS — BP 126/82 | HR 68 | Ht 62.0 in | Wt 150.0 lb

## 2020-11-27 DIAGNOSIS — I771 Stricture of artery: Secondary | ICD-10-CM

## 2020-11-27 DIAGNOSIS — I739 Peripheral vascular disease, unspecified: Secondary | ICD-10-CM

## 2020-11-27 DIAGNOSIS — I4891 Unspecified atrial fibrillation: Secondary | ICD-10-CM

## 2020-11-27 DIAGNOSIS — E785 Hyperlipidemia, unspecified: Secondary | ICD-10-CM

## 2020-11-27 DIAGNOSIS — I779 Disorder of arteries and arterioles, unspecified: Secondary | ICD-10-CM | POA: Diagnosis not present

## 2020-11-27 NOTE — Patient Instructions (Signed)
Medication Instructions:  No changes *If you need a refill on your cardiac medications before your next appointment, please call your pharmacy*   Lab Work: None ordered If you have labs (blood work) drawn today and your tests are completely normal, you will receive your results only by: [place] (if you have MyChart) OR A paper copy in the mail If you have any lab test that is abnormal or we need to change your treatment, we will call you to review the results.   Testing/Procedures: Your physician has requested that you have a lower extremity segmental doppler. This will take place at [place], [address].  Follow-Up: At [facility], you and your health needs are our priority.  As part of our continuing mission to provide you with exceptional heart care, we have created designated Provider Care Teams.  These Care Teams include your primary Cardiologist (physician) and Advanced Practice Providers (APPs -  Physician Assistants and Nurse Practitioners) who all work together to provide you with the care you need, when you need it.  We recommend signing up for the patient portal called "MyChart".  Sign up information is provided on this After Visit Summary.  MyChart is used to connect with patients for Virtual Visits (Telemedicine).  Patients are able to view lab/test results, encounter notes, upcoming appointments, etc.  Non-urgent messages can be sent to your provider as well.   To learn more about what you can do with MyChart, go to [email or web address].    Your next appointment:   12 month(s)  The format for your next appointment:   In Person  Provider:   Kathlyn Sacramento, MD

## 2020-11-27 NOTE — Progress Notes (Signed)
Cardiology Office Note   Date:  11/27/2020   ID:  [name], DOB 1940/06/01, MRN [id number]  PCP:  McLean-Scocuzza, Nino Glow, MD  Cardiologist: Dr. Irish Lack  No chief complaint on file.     History of Present Illness: [name] is a 80 y.o. female who is here today for follow-up visit regarding peripheral arterial disease.   She has known history of atrial fibrillation, hyperlipidemia, hypertension and carotid disease.  She is not a smoker and has no family history of coronary artery disease.  She is not diabetic. She is status post bilateral carotid artery stenting in 2014. She was seen in 2019 for bilateral calf claudication as well as arm discomfort with exertion. She had carotid Doppler done in November, 2019 which showed less than 50% stenosis bilaterally.  There was antegrade flow in both vertebral arteries and no significant disease in the subclavian arteries. She had noninvasive vascular evaluation done which showed an ABI of 0.88 on the right and 0.91 on the left but difficult to interpret this given that pressure in the arms was abnormal.   Duplex showed significant right common femoral artery disease with moderate SFA disease and occluded popliteal artery.  On the left, there was severe disease affecting the left distal SFA.   Duplex of the upper extremities showed severe subclavian disease bilaterally worse on the left side.   Aortic arch and lower extremity angiography was performed in February 2020. There was moderate right subclavian artery stenosis at the origin of the [place].  There was significant stenosis affecting the left proximal axillary artery.  In the right lower extremity, there was significant calcified common femoral artery disease extending into the ostium of the SFA with occluded popliteal artery and two-vessel runoff below the knee.  On the left, there was borderline significant common iliac artery stenosis with diffuse moderate disease affecting the  entire left SFA with severe focal stenosis distally and three-vessel runoff below the knee.  Given that the patient's symptoms were not lifestyle limiting, I advised her to start an exercise program before considering revascularization.   She has been doing reasonably well overall.  She reports slight worsening of bilateral leg claudication but overall she is able to continue with activities of daily living and some form of exercise.  She tries to play pickle ball when records are available.  Sometimes she has to stop and rest for few minutes but then when she resumes she does not get pain anymore.  Past Medical History:  Diagnosis Date   Arthritis    hands, knees, ankles   BCC (basal cell carcinoma of skin) 2016   Stinehelfer   Bilateral carotid artery stenosis    a. s/p right carotid stent after restenosis post CEA, left carotid stent (previously on Plavix for these). b. Carotid US 03/2016: stable 40-59% BICA.   Fibrocystic breast disease    followed by surgery Jamal Collin)   GERD (gastroesophageal reflux disease)    OTC prilosec   Hernia 2005   History of chicken pox    History of colon polyps    History of melanoma 1990s   right shoulder   HLD (hyperlipidemia)    HTN (hypertension) 1970s   Hypertensive retinopathy of both eyes, grade 1 2017   Bulakowski   Kidney cyst, acquired 2016   large R septated 8cm, stable 2017   Melanoma in situ ([place]) 2017   R medial posterior arm - lentio maligna type, margin involved (Stinehelfer)   PAF (  paroxysmal atrial fibrillation) ([place])    a. dx 12/2015 at routine physical.    Past Surgical History:  Procedure Laterality Date   ABDOMINAL AORTOGRAM W/LOWER EXTREMITY N/A 06/23/2018   Procedure: ABDOMINAL AORTOGRAM W/LOWER EXTREMITY;  Surgeon: Wellington Hampshire, MD;  Location: [facility];  Service: Cardiovascular;  Laterality: N/A;   ABI  2007   R 1.19, L 1.2   AORTIC ARCH ANGIOGRAPHY N/A 06/23/2018   Procedure: AORTIC ARCH ANGIOGRAPHY;   Surgeon: Wellington Hampshire, MD;  Location: [facility];  Service: Cardiovascular;  Laterality: N/A;   APPENDECTOMY  1995   BREAST BIOPSY Left 1997   left   BREAST BIOPSY Right 08/22/2020   [place] Bx, Q-clip, neg/ discordant   BREAST BIOPSY Right 09/05/2020   stereo biopsy/ ribbon clip/ path pending   BREAST CYST ASPIRATION Left 02/1997   fna   BREAST EXCISIONAL BIOPSY Right 1998   benign per pt   CARDIOVASCULAR STRESS TEST  ~2003   WNL   CAROTID ENDARTERECTOMY  2002   bilateral   CAROTID STENT Right 2002   right after restenosis post CEA (Dr. Edd Arbour)   CAROTID STENT  2014   for stenosis   CAROTID STENT Left 2014   Dr. Ardeth Sportsman, [place]; 1972   COLONOSCOPY  03/2012   Jamal Collin - polyp (rec rpt 5 yrs)   [place]   for GI pain, inflamed lower GI, unrevealing workup, improved with abx   HERNIA REPAIR  2005   abdomen   MELANOMA EXCISION  1998   right shoulder   TOTAL HIP ARTHROPLASTY  2005   bilat     Current Outpatient Medications  Medication Sig Dispense Refill   Acetaminophen (TYLENOL ARTHRITIS PAIN PO) Take 1 tablet by mouth daily.     acetaminophen (TYLENOL) 500 MG tablet Take 500 mg by mouth daily.      amoxicillin-clavulanate (AUGMENTIN) 875-125 MG tablet Take 1 tablet by mouth every 12 (twelve) hours. 14 tablet 0   apixaban (ELIQUIS) 5 MG TABS tablet TAKE 1 TABLET BY MOUTH  TWICE DAILY 180 tablet 1   atorvastatin (LIPITOR) 40 MG tablet TAKE 1 TABLET BY MOUTH AT  BEDTIME 90 tablet 3   azithromycin (ZITHROMAX) 250 MG tablet Take 1 tablet (250 mg total) by mouth daily. Take first 2 tablets together, then 1 every day until finished. 6 tablet 0   CARTIA XT 120 MG 24 hr capsule TAKE 1 CAPSULE BY MOUTH  DAILY 90 capsule 3   Cholecalciferol (VITAMIN D-3) 25 MCG (1000 UT) CAPS Take by mouth.     enalapril (VASOTEC) 5 MG tablet TAKE 1 TABLET BY MOUTH  DAILY 90 tablet 3   fluorouracil (EFUDEX) 5 % cream as needed.     metoprolol  succinate (TOPROL-XL) 100 MG 24 hr tablet TAKE 1 TABLET BY MOUTH  DAILY WITH OR IMMEDIATELY  FOLLOWING A MEAL 90 tablet 3   Multiple Vitamin (MULTIVITAMIN WITH MINERALS) TABS tablet Take 1 tablet by mouth daily.     triamcinolone cream (KENALOG) 0.1 % Apply 1 application topically daily as needed (skin cancer).     fluticasone (FLONASE) 50 MCG/ACT nasal spray Place 2 sprays into both nostrils daily as needed for allergies or rhinitis.     No current facility-administered medications for this visit.    Allergies:   Patient has no known allergies.    Social History:  The patient  reports that she has quit smoking. Her  smoking use included cigarettes. She has a 30.00 pack-year smoking history. She has never used smokeless tobacco. She reports current alcohol use. She reports that she does not use drugs.   Family History:  The patient's family history includes Cancer (age of onset: 26) in her father; Cancer (age of onset: 3) in her mother; Hypertension in her paternal grandfather.    ROS:  Please see the history of present illness.   Otherwise, review of systems are positive for none.   All other systems are reviewed and negative.    PHYSICAL EXAM: VS:  BP 126/82   Pulse 68   Ht 5\' 2"  (1.575 m)   Wt 150 lb (68 kg)   SpO2 95%   BMI 27.44 kg/m  , BMI Body mass index is 27.44 kg/m. GEN: Well nourished, well developed, in no acute distress  HEENT: normal  Neck: no JVD, carotid bruits, or masses Cardiac: Irregularly irregular; no murmurs, rubs, or gallops, trace edema  Respiratory:  clear to auscultation bilaterally, normal work of breathing GI: soft, nontender, nondistended, + BS MS: no deformity or atrophy  Skin: warm and dry, no rash Neuro:  Strength and sensation are intact Psych: euthymic mood, full affect Vascular: Radial pulses +1 bilaterally.  Femoral pulses mildly diminished bilaterally.  Distal pulses are not palpable.   EKG:  EKG is ordered today. EKG showed atrial  fibrillation with ventricular rate of 81 bpm.   Recent Labs: 10/16/2020: ALT 19; BUN 19; Creatinine, Ser 1.04; Hemoglobin 16.4; Platelets 179; Potassium 4.7; Sodium 141; TSH 2.710    Lipid Panel    Component Value Date/Time   CHOL 147 10/16/2020 0756   TRIG 106 10/16/2020 0756   TRIG 144 03/28/2011 0000   HDL 47 10/16/2020 0756   CHOLHDL 3.1 10/16/2020 0756   CHOLHDL 3 07/21/2019 0833   VLDL 18.0 07/21/2019 0833   LDLCALC 81 10/16/2020 0756   LDLCALC 76 02/16/2018 0924   LDLDIRECT 78 03/28/2011 0000      Wt Readings from Last 3 Encounters:  11/27/20 150 lb (68 kg)  10/12/20 158 lb (71.7 kg)  06/28/20 153 lb (69.4 kg)       No flowsheet data found.    ASSESSMENT AND PLAN:  1.  Peripheral arterial disease: She reports slight worsening of bilateral calf claudication worse on the left than the right but she is still able to do activities of daily living.  I requested lower extremity arterial Doppler to ensure stability. In terms of revascularization options, the disease on the right lower extremity requires right femoral endarterectomy into the ostium of the SFA or atherectomy as a second choice.  On the left side, the whole SFA will need to be treated.  2. 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 she reports mild arm claudication that is currently not lifestyle limiting.   Continue medical therapy.  3.  Carotid disease: Status post bilateral carotid artery stenting in 2014.  I reviewed most recent carotid Doppler in June of this year showed no significant restenosis.  4.  Atrial fibrillation: Ventricular rate is controlled and she is tolerating anticoagulation.  5.  Hyperlipidemia: Continue treatment with atorvastatin with a target LDL of less than 70.  Most recent lipid profile showed an LDL of  81.   Disposition:   FU with me in 12 months  Signed,  Kathlyn Sacramento, MD  11/27/2020 11:08 AM    [facility]

## 2020-12-10 ENCOUNTER — Other Ambulatory Visit: Payer: Self-pay

## 2020-12-10 ENCOUNTER — Ambulatory Visit (HOSPITAL_COMMUNITY)
Admission: RE | Admit: 2020-12-10 | Discharge: 2020-12-10 | Disposition: A | Discharge status: Home / Self Care | Payer: Medicare Other | Source: Ambulatory Visit | Attending: Cardiology | Admitting: Cardiology

## 2020-12-10 DIAGNOSIS — I739 Peripheral vascular disease, unspecified: Secondary | ICD-10-CM | POA: Insufficient documentation

## 2020-12-13 ENCOUNTER — Other Ambulatory Visit: Payer: Self-pay | Admitting: Physician Assistant

## 2020-12-25 DIAGNOSIS — H16223 Keratoconjunctivitis sicca, not specified as Sjogren's, bilateral: Secondary | ICD-10-CM | POA: Diagnosis not present

## 2020-12-25 DIAGNOSIS — H5203 Hypermetropia, bilateral: Secondary | ICD-10-CM | POA: Diagnosis not present

## 2020-12-25 DIAGNOSIS — H524 Presbyopia: Secondary | ICD-10-CM | POA: Diagnosis not present

## 2020-12-25 DIAGNOSIS — H2513 Age-related nuclear cataract, bilateral: Secondary | ICD-10-CM | POA: Diagnosis not present

## 2021-02-13 DIAGNOSIS — L570 Actinic keratosis: Secondary | ICD-10-CM | POA: Diagnosis not present

## 2021-02-13 DIAGNOSIS — L821 Other seborrheic keratosis: Secondary | ICD-10-CM | POA: Diagnosis not present

## 2021-02-13 DIAGNOSIS — Z85820 Personal history of malignant melanoma of skin: Secondary | ICD-10-CM | POA: Diagnosis not present

## 2021-02-13 DIAGNOSIS — Z85828 Personal history of other malignant neoplasm of skin: Secondary | ICD-10-CM | POA: Diagnosis not present

## 2021-02-13 DIAGNOSIS — L853 Xerosis cutis: Secondary | ICD-10-CM | POA: Diagnosis not present

## 2021-02-13 DIAGNOSIS — L814 Other melanin hyperpigmentation: Secondary | ICD-10-CM | POA: Diagnosis not present

## 2021-03-05 ENCOUNTER — Encounter: Payer: Self-pay | Admitting: General Surgery

## 2021-04-30 ENCOUNTER — Other Ambulatory Visit: Payer: Self-pay | Admitting: Cardiovascular Disease

## 2021-05-01 NOTE — Telephone Encounter (Signed)
Prescription refill request for Eliquis received. Indication: Atrial fib Last office visit: 11/27/20  Rod Can MD Scr: 1.04 on 10/16/20 Age: 80 Weight: 68kg  Based on above findings Eliquis 5mg  twice daily is the appropriate dose.  Needs 6 month labs.  Message sent to nurse to get pt scheduled for CBC/BMP.  Refill approved x 1

## 2021-05-01 NOTE — Telephone Encounter (Signed)
Refill Request.  

## 2021-05-23 DIAGNOSIS — M25511 Pain in right shoulder: Secondary | ICD-10-CM | POA: Diagnosis not present

## 2021-07-01 ENCOUNTER — Ambulatory Visit (INDEPENDENT_AMBULATORY_CARE_PROVIDER_SITE_OTHER): Payer: Medicare Other

## 2021-07-01 VITALS — BP 128/80 | Ht 62.0 in | Wt 150.0 lb

## 2021-07-01 DIAGNOSIS — Z0000 Encounter for general adult medical examination without abnormal findings: Secondary | ICD-10-CM | POA: Diagnosis not present

## 2021-07-01 NOTE — Patient Instructions (Addendum)
[name] , Thank you for taking time to come for your Medicare Wellness Visit. I appreciate your ongoing commitment to your health goals. Please review the following plan we discussed and let me know if I can assist you in the future.   These are the goals we discussed:  Goals      Follow up with Primary Care Provider     As needed.        This is a list of the screening recommended for you and due dates:  Health Maintenance  Topic Date Due   COVID-19 Vaccine (4 - Booster for Pfizer series) 07/17/2021*   Flu Shot  08/09/2021*   Tetanus Vaccine  10/14/2022   Pneumonia Vaccine  Completed   DEXA scan (bone density measurement)  Completed   Zoster (Shingles) Vaccine  Completed   HPV Vaccine  Aged Out  *Topic was postponed. The date shown is not the original due date.   Advanced directives: on file  Conditions/risks identified: none new  Follow up in one year for your annual wellness visit    Preventive Care 65 Years and Older, Female Preventive care refers to lifestyle choices and visits with your health care provider that can promote health and wellness. What does preventive care include? A yearly physical exam. This is also called an annual well check. Dental exams once or twice a year. Routine eye exams. Ask your health care provider how often you should have your eyes checked. Personal lifestyle choices, including: Daily care of your teeth and gums. Regular physical activity. Eating a healthy diet. Avoiding tobacco and drug use. Limiting alcohol use. Practicing safe sex. Taking low-dose aspirin every day. Taking vitamin and mineral supplements as recommended by your health care provider. What happens during an annual well check? The services and screenings done by your health care provider during your annual well check will depend on your age, overall health, lifestyle risk factors, and family history of disease. Counseling  Your health care provider may ask you  questions about your: Alcohol use. Tobacco use. Drug use. Emotional well-being. Home and relationship well-being. Sexual activity. Eating habits. History of falls. Memory and ability to understand (cognition). Work and work Statistician. Reproductive health. Screening  You may have the following tests or measurements: Height, weight, and BMI. Blood pressure. Lipid and cholesterol levels. These may be checked every 5 years, or more frequently if you are over 46 years old. Skin check. Lung cancer screening. You may have this screening every year starting at age 68 if you have a 30-pack-year history of smoking and currently smoke or have quit within the past 15 years. Fecal occult blood test (FOBT) of the stool. You may have this test every year starting at age 48. Flexible sigmoidoscopy or colonoscopy. You may have a sigmoidoscopy every 5 years or a colonoscopy every 10 years starting at age 22. Hepatitis C blood test. Hepatitis B blood test. Sexually transmitted disease (STD) testing. Diabetes screening. This is done by checking your blood sugar (glucose) after you have not eaten for a while (fasting). You may have this done every 1-3 years. Bone density scan. This is done to screen for osteoporosis. You may have this done starting at age 11. Mammogram. This may be done every 1-2 years. Talk to your health care provider about how often you should have regular mammograms. Talk with your health care provider about your test results, treatment options, and if necessary, the need for more tests. Vaccines  Your health care  provider may recommend certain vaccines, such as: Influenza vaccine. This is recommended every year. Tetanus, diphtheria, and acellular pertussis (Tdap, Td) vaccine. You may need a Td booster every 10 years. Zoster vaccine. You may need this after age 73. Pneumococcal 13-valent conjugate (PCV13) vaccine. One dose is recommended after age 32. Pneumococcal polysaccharide  (PPSV23) vaccine. One dose is recommended after age 76. Talk to your health care provider about which screenings and vaccines you need and how often you need them. This information is not intended to replace advice given to you by your health care provider. Make sure you discuss any questions you have with your health care provider. Document Released: 05/25/2015 Document Revised: 01/16/2016 Document Reviewed: 02/27/2015 Elsevier Interactive Patient Education  2017 [place] Prevention in the Home Falls can cause injuries. They can happen to people of all ages. There are many things you can do to make your home safe and to help prevent falls. What can I do on the outside of my home? Regularly fix the edges of walkways and driveways and fix any cracks. Remove anything that might make you trip as you walk through a door, such as a raised step or threshold. Trim any bushes or trees on the path to your home. Use bright outdoor lighting. Clear any walking paths of anything that might make someone trip, such as rocks or tools. Regularly check to see if handrails are loose or broken. Make sure that both sides of any steps have handrails. Any raised decks and porches should have guardrails on the edges. Have any leaves, snow, or ice cleared regularly. Use sand or salt on walking paths during winter. Clean up any spills in your garage right away. This includes oil or grease spills. What can I do in the bathroom? Use night lights. Install grab bars by the toilet and in the tub and shower. Do not use towel bars as grab bars. Use non-skid mats or decals in the tub or shower. If you need to sit down in the shower, use a plastic, non-slip stool. Keep the floor dry. Clean up any water that spills on the floor as soon as it happens. Remove soap buildup in the tub or shower regularly. Attach bath mats securely with double-sided non-slip rug tape. Do not have throw rugs and other things on the  floor that can make you trip. What can I do in the bedroom? Use night lights. Make sure that you have a light by your bed that is easy to reach. Do not use any sheets or blankets that are too big for your bed. They should not hang down onto the floor. Have a firm chair that has side arms. You can use this for support while you get dressed. Do not have throw rugs and other things on the floor that can make you trip. What can I do in the kitchen? Clean up any spills right away. Avoid walking on wet floors. Keep items that you use a lot in easy-to-reach places. If you need to reach something above you, use a strong step stool that has a grab bar. Keep electrical cords out of the way. Do not use floor polish or wax that makes floors slippery. If you must use wax, use non-skid floor wax. Do not have throw rugs and other things on the floor that can make you trip. What can I do with my stairs? Do not leave any items on the stairs. Make sure that there are handrails on both  sides of the stairs and use them. Fix handrails that are broken or loose. Make sure that handrails are as long as the stairways. Check any carpeting to make sure that it is firmly attached to the stairs. Fix any carpet that is loose or worn. Avoid having throw rugs at the top or bottom of the stairs. If you do have throw rugs, attach them to the floor with carpet tape. Make sure that you have a light switch at the top of the stairs and the bottom of the stairs. If you do not have them, ask someone to add them for you. What else can I do to help prevent falls? Wear shoes that: Do not have high heels. Have rubber bottoms. Are comfortable and fit you well. Are closed at the toe. Do not wear sandals. If you use a stepladder: Make sure that it is fully opened. Do not climb a closed stepladder. Make sure that both sides of the stepladder are locked into place. Ask someone to hold it for you, if possible. Clearly mark and make  sure that you can see: Any grab bars or handrails. First and last steps. Where the edge of each step is. Use tools that help you move around (mobility aids) if they are needed. These include: Canes. Walkers. Scooters. Crutches. Turn on the lights when you go into a dark area. Replace any light bulbs as soon as they burn out. Set up your furniture so you have a clear path. Avoid moving your furniture around. If any of your floors are uneven, fix them. If there are any pets around you, be aware of where they are. Review your medicines with your doctor. Some medicines can make you feel dizzy. This can increase your chance of falling. Ask your doctor what other things that you can do to help prevent falls. This information is not intended to replace advice given to you by your health care provider. Make sure you discuss any questions you have with your health care provider. Document Released: 02/22/2009 Document Revised: 10/04/2015 Document Reviewed: 06/02/2014 Elsevier Interactive Patient Education  2017 [employer].

## 2021-07-01 NOTE — Progress Notes (Signed)
Subjective:   [name] is a 81 y.o. female who presents for Medicare Annual (Subsequent) preventive examination.  Review of Systems    No ROS.  Medicare Wellness Virtual Visit.  Visual/audio telehealth visit, UTA vital signs.   See social history for additional risk factors.   Cardiac Risk Factors include: advanced age (>65men, >27 women)     Objective:    Today's Vitals   07/01/21 1451  BP: 128/80  Weight: 150 lb (68 kg)  Height: 5\' 2"  (1.575 m)   Body mass index is 27.44 kg/m.  Advanced Directives 07/01/2021 06/28/2020 04/06/2019 06/23/2018  Does Patient Have a Medical Advance Directive? Yes Yes Yes Yes  Type of Paramedic of [place];Living will [place];Living will [place];Living will [place];Living will  Does patient want to make changes to medical advance directive? No - Patient declined No - Patient declined No - Patient declined No - Patient declined  Copy of [place] in Chart? Yes - validated most recent copy scanned in chart (See row information) Yes - validated most recent copy scanned in chart (See row information) Yes - validated most recent copy scanned in chart (See row information) No - copy requested    Current Medications (verified) Outpatient Encounter Medications as of 07/01/2021  Medication Sig   Acetaminophen (TYLENOL ARTHRITIS PAIN PO) Take 1 tablet by mouth daily.   acetaminophen (TYLENOL) 500 MG tablet Take 500 mg by mouth daily.    apixaban (ELIQUIS) 5 MG TABS tablet TAKE 1 TABLET BY MOUTH  TWICE DAILY   atorvastatin (LIPITOR) 40 MG tablet TAKE 1 TABLET BY MOUTH AT  BEDTIME   Cholecalciferol (VITAMIN D-3) 25 MCG (1000 UT) CAPS Take by mouth.   diltiazem (CARDIZEM CD) 120 MG 24 hr capsule TAKE 1 CAPSULE BY MOUTH  DAILY   enalapril (VASOTEC) 5 MG tablet TAKE 1 TABLET BY MOUTH  DAILY   fluorouracil (EFUDEX) 5 % cream as needed.   metoprolol  succinate (TOPROL-XL) 100 MG 24 hr tablet TAKE 1 TABLET BY MOUTH  DAILY WITH OR IMMEDIATELY  FOLLOWING A MEAL   Multiple Vitamin (MULTIVITAMIN WITH MINERALS) TABS tablet Take 1 tablet by mouth daily.   triamcinolone cream (KENALOG) 0.1 % Apply 1 application topically daily as needed (skin cancer).   [DISCONTINUED] amoxicillin-clavulanate (AUGMENTIN) 875-125 MG tablet Take 1 tablet by mouth every 12 (twelve) hours.   [DISCONTINUED] azithromycin (ZITHROMAX) 250 MG tablet Take 1 tablet (250 mg total) by mouth daily. Take first 2 tablets together, then 1 every day until finished.   [DISCONTINUED] fluticasone (FLONASE) 50 MCG/ACT nasal spray Place 2 sprays into both nostrils daily as needed for allergies or rhinitis.   No facility-administered encounter medications on file as of 07/01/2021.    Allergies (verified) Patient has no known allergies.   History: Past Medical History:  Diagnosis Date   Arthritis    hands, knees, ankles   BCC (basal cell carcinoma of skin) 2016   [facility]   Bilateral carotid artery stenosis    a. s/p right carotid stent after restenosis post CEA, left carotid stent (previously on Plavix for these). b. Carotid US 03/2016: stable 40-59% BICA.   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 dx 12/2015 at routine physical.   Past Surgical History:  Procedure Laterality Date   ABDOMINAL AORTOGRAM W/LOWER EXTREMITY N/A 06/23/2018   Procedure:  ABDOMINAL AORTOGRAM W/LOWER EXTREMITY;  Surgeon: Wellington Hampshire, MD;  Location: [facility];  Service: Cardiovascular;  Laterality: N/A;   ABI  08-24-2005   R 1.19, L 1.2   AORTIC ARCH ANGIOGRAPHY N/A 06/23/2018   Procedure: AORTIC ARCH ANGIOGRAPHY;  Surgeon: Wellington Hampshire, MD;  Location: [facility];  Service: Cardiovascular;  Laterality: N/A;   APPENDECTOMY  1995   BREAST BIOPSY Left 1997   left   BREAST BIOPSY Right 08/22/2020   [place] Bx, Q-clip, neg/ discordant   BREAST BIOPSY Right 09/05/2020   stereo biopsy/ ribbon clip/ path pending   BREAST CYST ASPIRATION Left 02/1997   fna   BREAST EXCISIONAL BIOPSY Right 1998   benign per pt   CARDIOVASCULAR STRESS TEST  Aug 24, 2001   WNL   CAROTID ENDARTERECTOMY  Aug 24, 2000   bilateral   CAROTID STENT Right 08-24-2000   right after restenosis post CEA (Dr. Edd Arbour)   CAROTID STENT  08-24-2012   for stenosis   CAROTID STENT Left 2012-08-24   Dr. Ardeth Sportsman, [place]; 08/25/1970   COLONOSCOPY  03/2012   Jamal Collin - polyp (rec rpt 5 yrs)   [place]   for GI pain, inflamed lower GI, unrevealing workup, improved with abx   HERNIA REPAIR  2003/08/25   abdomen   MELANOMA EXCISION  1998   right shoulder   TOTAL HIP ARTHROPLASTY  25-Aug-2003   bilat   Family History  Problem Relation Age of Onset   Cancer Mother 74       lung, smoker   Cancer Father 32       prostate   Hypertension Paternal Grandfather    CAD Neg Hx    Stroke Neg Hx    Diabetes Neg Hx    Heart attack Neg Hx    Breast cancer Neg Hx    Social History   Socioeconomic History   Marital status: Widowed    Spouse name: Not on file   Number of children: Not on file   Years of education: Not on file   Highest education level: Not on file  Occupational History   Not on file  Tobacco Use   Smoking status: Former    Packs/day: 1.00    Years: 30.00    Pack years: 30.00    Types: Cigarettes   Smokeless tobacco: Never  Vaping Use   Vaping Use: Never used   Substance and Sexual Activity   Alcohol use: Yes    Alcohol/week: 0.0 standard drinks    Comment: 1 glass wine daily   Drug use: No   Sexual activity: Not on file  Other Topics Concern   Not on file  Social History Narrative   Caffeine: 3 cups coffee/day   Widow of husband Jori Moll) died in Aug 24, 2013 cancer, lives with 1 dog.  Grown children   Occupation: homemaker, used to be Catering manager  Edu: college   Activity: plays golf, walks dog, swims   Diet: good water, fruits/vegetables daily   Former smoker age 9 to 1958/9 1 pk lasted 1 week    Social Determinants of Radio broadcast assistant Strain: Low Risk    Difficulty of Paying Living Expenses: Not hard at all  Food Insecurity: No Food Insecurity   Worried About Charity fundraiser in the Last Year: Never true   Arboriculturist in the Last Year: Never true  Transportation Needs: No Transportation Needs   Lack of Transportation (Medical): No   Lack of Transportation (Non-Medical): No  Physical Activity: Sufficiently Active   Days of Exercise per Week: 3 days   Minutes of Exercise per Session: 60 min  Stress: No Stress Concern Present   Feeling of Stress : Not at all  Social Connections: Unknown   Frequency of Communication with Friends and Family: Not on file   Frequency of Social Gatherings with Friends and Family: Three times a week   Attends Religious Services: Not on file   Active Member of Clubs or Organizations: Not on file   Attends Archivist Meetings: Not on file   Marital Status: Not on file    Tobacco Counseling Counseling given: Not Answered   Clinical Intake:  Pre-visit preparation completed: Yes        Diabetes: No  How often do you need to have someone help you when you read instructions, pamphlets, or other written materials from your doctor or pharmacy?: 1 - Never    Interpreter Needed?: No      Activities of Daily Living In your present state of health, do you have any  difficulty performing the following activities: 07/01/2021  Hearing? N  Vision? N  Difficulty concentrating or making decisions? N  Walking or climbing stairs? N  Dressing or bathing? N  Doing errands, shopping? N  Preparing Food and eating ? N  Using the Toilet? N  In the past six months, have you accidently leaked urine? N  Do you have problems with loss of bowel control? N  Managing your Medications? N  Managing your Finances? N  Housekeeping or managing your Housekeeping? N  Some recent data might be hidden    Patient Care Team: McLean-Scocuzza, Nino Glow, MD as PCP - General (Internal Medicine) Jettie Booze, MD as PCP - Cardiology (Cardiology) Christene Lye, MD (General Surgery) Margarita Rana, MD as Referring Physician (Family Medicine)  Indicate any recent Medical Services you may have received from other than [facility] providers in the past year (date may be approximate).     Assessment:   This is a routine wellness examination for [place].  Virtual Visit via Telephone Note  I connected with  [name] on 07/01/21 at  2:45 PM EST by telephone and verified that I am speaking with the correct person using two identifiers.  Persons participating in the virtual visit: patient/Nurse Health Advisor   I discussed the limitations, risks, security and privacy concerns of performing an evaluation and management service by telephone and the availability of in person appointments. The patient expressed understanding and agreed to proceed.  Interactive audio and video telecommunications were attempted between this nurse and patient, however failed, due to patient having technical difficulties OR patient did not have access to video capability.  We continued and completed visit with audio only.  Some vital signs may be absent or patient reported.   Hearing/Vision screen Hearing Screening -  Comments:: Patient is able to hear conversational tones without difficulty. No  issues reported. Vision Screening - Comments:: [facility] Wears corrective lenses  They have seen their ophthalmologist in the last 12 months.   Dietary issues and exercise activities discussed: Current Exercise Habits: Home exercise routine, Type of exercise: walking ([place]), Intensity: Mild Regular diet Good water intake   Goals Addressed             This Visit's Progress    Follow up with Primary Care Provider       As needed.       Depression Screen PHQ 2/9 Scores 07/01/2021 06/28/2020 07/06/2019 04/06/2019 12/30/2018 02/11/2018 12/31/2015  PHQ - 2 Score 0 0 0 0 0 0 0    Fall Risk Fall Risk  07/01/2021 06/28/2020 07/06/2019 04/06/2019 12/30/2018  Falls in the past year? 0 0 0 0 0  Number falls in past yr: 0 0 0 - 0  Injury with Fall? - 0 0 - -  Follow up Falls evaluation completed Falls evaluation completed Falls evaluation completed Falls prevention discussed;Education provided -    FALL RISK PREVENTION PERTAINING TO THE HOME:  Home free of loose throw rugs in walkways, pet beds, electrical cords, etc? Yes  Adequate lighting in your home to reduce risk of falls? Yes   ASSISTIVE DEVICES UTILIZED TO PREVENT FALLS: Use of a cane, walker or w/c? No   TIMED UP AND GO: Was the test performed? No .   Cognitive Function:  Patient is alert and oriented x3. Enjoys sodoku and other brain health activities.    6CIT Screen 07/01/2021 06/28/2020 04/06/2019  What Year? 0 points 0 points 0 points  What month? 0 points 0 points 0 points  What time? 0 points 0 points 0 points  Count back from 20 0 points - 0 points  Months in reverse 0 points 0 points 0 points  Repeat phrase 0 points - 0 points  Total Score 0 - 0    Immunizations Immunization History  Administered Date(s) Administered   Fluad Quad(high Dose 65+) 01/14/2019   Influenza Whole 01/10/2013   Influenza, High Dose Seasonal PF 01/24/2015, 02/18/2016, 02/09/2017, 02/02/2018   Influenza, Seasonal,  Injecte, Preservative Fre 07/11/2014   Influenza-Unspecified 02/03/2020   PFIZER(Purple Top)SARS-COV-2 Vaccination 06/16/2019, 07/11/2019, 04/28/2020   Pneumococcal Conjugate-13 11/03/2014   Pneumococcal Polysaccharide-23 10/13/2012, 02/02/2018   Td 10/13/2012   Zoster Recombinat (Shingrix) 12/23/2019, 05/24/2020   Zoster, Live 10/23/2012   Screening Tests Health Maintenance  Topic Date Due   COVID-19 Vaccine (4 - Booster for [place] series) 07/17/2021 (Originally 06/23/2020)   INFLUENZA VACCINE  08/09/2021 (Originally 12/10/2020)   TETANUS/TDAP  10/14/2022   Pneumonia Vaccine 40+ Years old  Completed   DEXA SCAN  Completed   Zoster Vaccines- Shingrix  Completed   HPV VACCINES  Aged Out   Health Maintenance There are no preventive care reminders to display for this patient.  Lung Cancer Screening: (Low Dose CT Chest recommended if Age 90-80 years, 30 pack-year currently smoking OR have quit w/in 15years.) does not qualify.   Hepatitis C Screening: does not qualify  Vision Screening: Recommended annual ophthalmology exams for early detection of glaucoma and other disorders of the eye.  Dental Screening: Recommended annual dental exams for proper oral hygiene  Community Resource Referral / Chronic Care Management: CRR required this visit?  No   CCM required this visit?  No      Plan:   Keep all routine maintenance appointments.  I have personally reviewed and noted the following in the patients chart:   Medical and social history Use of alcohol, tobacco or illicit drugs  Current medications and supplements including opioid prescriptions.  Functional ability and status Nutritional status Physical activity Advanced directives List of other physicians Hospitalizations, surgeries, and ER visits in previous 12 months Vitals Screenings to include cognitive, depression, and falls Referrals and appointments  In addition, I have reviewed and discussed with patient certain  preventive protocols, quality metrics, and best practice recommendations. A written personalized care plan for preventive services as well as general preventive health recommendations were provided to patient via mychart.     Varney Biles, LPN   0/03/33

## 2021-07-09 ENCOUNTER — Other Ambulatory Visit: Payer: Self-pay | Admitting: Internal Medicine

## 2021-07-09 DIAGNOSIS — Z1231 Encounter for screening mammogram for malignant neoplasm of breast: Secondary | ICD-10-CM

## 2021-07-24 ENCOUNTER — Other Ambulatory Visit: Payer: Self-pay | Admitting: Cardiovascular Disease

## 2021-07-25 NOTE — Telephone Encounter (Signed)
Prescription refill request for Eliquis received. ?Indication: Atrial fib ?Last office visit: 11/27/20  Rod Can MD ?Scr: 1.04 on 10/16/20 ?Age: 81 ?Weight: 68kg ? ?Based on above findings Eliquis '5mg'$  twice daily is the appropriate dose.  Refill approved. ? ?

## 2021-07-25 NOTE — Telephone Encounter (Signed)
Refill request

## 2021-07-29 DIAGNOSIS — L821 Other seborrheic keratosis: Secondary | ICD-10-CM | POA: Diagnosis not present

## 2021-07-29 DIAGNOSIS — Z85820 Personal history of malignant melanoma of skin: Secondary | ICD-10-CM | POA: Diagnosis not present

## 2021-07-29 DIAGNOSIS — L570 Actinic keratosis: Secondary | ICD-10-CM | POA: Diagnosis not present

## 2021-07-29 DIAGNOSIS — Z85828 Personal history of other malignant neoplasm of skin: Secondary | ICD-10-CM | POA: Diagnosis not present

## 2021-08-09 ENCOUNTER — Ambulatory Visit
Admission: RE | Admit: 2021-08-09 | Discharge: 2021-08-09 | Disposition: A | Discharge status: Home / Self Care | Payer: Medicare Other | Source: Ambulatory Visit | Attending: Internal Medicine | Admitting: Internal Medicine

## 2021-08-09 DIAGNOSIS — Z1231 Encounter for screening mammogram for malignant neoplasm of breast: Secondary | ICD-10-CM | POA: Diagnosis not present

## 2021-10-11 ENCOUNTER — Other Ambulatory Visit (HOSPITAL_COMMUNITY): Payer: Self-pay | Admitting: Interventional Cardiology

## 2021-10-11 DIAGNOSIS — I6523 Occlusion and stenosis of bilateral carotid arteries: Secondary | ICD-10-CM

## 2021-10-11 DIAGNOSIS — Z95828 Presence of other vascular implants and grafts: Secondary | ICD-10-CM

## 2021-10-18 ENCOUNTER — Ambulatory Visit (HOSPITAL_COMMUNITY)
Admission: RE | Admit: 2021-10-18 | Discharge: 2021-10-18 | Disposition: A | Discharge status: Home / Self Care | Payer: Medicare Other | Source: Ambulatory Visit | Attending: Interventional Cardiology | Admitting: Interventional Cardiology

## 2021-10-18 DIAGNOSIS — I6523 Occlusion and stenosis of bilateral carotid arteries: Secondary | ICD-10-CM | POA: Diagnosis not present

## 2021-10-18 DIAGNOSIS — Z95828 Presence of other vascular implants and grafts: Secondary | ICD-10-CM | POA: Diagnosis not present

## 2021-10-22 ENCOUNTER — Other Ambulatory Visit: Payer: Self-pay | Admitting: *Deleted

## 2021-10-22 DIAGNOSIS — I779 Disorder of arteries and arterioles, unspecified: Secondary | ICD-10-CM

## 2021-11-15 ENCOUNTER — Other Ambulatory Visit: Payer: Self-pay | Admitting: Interventional Cardiology

## 2021-11-17 NOTE — Progress Notes (Unsigned)
Cardiology Office Note   Date:  11/17/2021   ID:  [name], DOB 10-10-1940, MRN [id number]  PCP:  McLean-Scocuzza, Nino Glow, MD    No chief complaint on file.  AFib  Wt Readings from Last 3 Encounters:  07/01/21 150 lb (68 kg)  11/27/20 150 lb (68 kg)  10/12/20 158 lb (71.7 kg)       History of Present Illness: [name] is a 81 y.o. female   who has had a right carotid stent after restenosis post CEA. She saw Dr. Edd Arbour at [facility], now at [facility]. She had been following with him for a while but came to me for local care. SHe has not had heart probelms. She has had a stress test many years ago which was negative.  She had a left carotid stent done in June 17, 2022.    Her husband passed away in 2023-02-17. Daughter lives in [place], and sees my sister in law for a PMD.     She was diagnosed with AFib earlier in 2017.  She was asymptomatic.  She had no bleeding problems after starting Eliquis.   She was doing Chief of Staff.   She has a cyst on her kidneys.  THis is being followed by CT.    She is also followed by Dr. Fletcher Anon for peripheral arterial disease.  This affects both her lower extremities and her upper extremities.   She has been trying to walk more.  No nonhealing sores.   She plays golf occasionally- 9 holes at a time.   In 2022, had cut back some on walking due to claudication.       Past Medical History:  Diagnosis Date   Arthritis    hands, knees, ankles   BCC (basal cell carcinoma of skin) 2016   Stinehelfer   Bilateral carotid artery stenosis    a. s/p right carotid stent after restenosis post CEA, left carotid stent (previously on Plavix for these). b. Carotid US 03/2016: stable 40-59% BICA.   Fibrocystic breast disease    followed by surgery Jamal Collin)   GERD (gastroesophageal reflux disease)    OTC prilosec   Hernia 2005   History of chicken pox    History of colon polyps    History of melanoma 1990s   right shoulder   HLD (hyperlipidemia)    HTN  (hypertension) 1970s   Hypertensive retinopathy of both eyes, grade 1 2017   Bulakowski   Kidney cyst, acquired 2016   large R septated 8cm, stable 2017   Melanoma in situ ([place]) 2017   R medial posterior arm - lentio maligna type, margin involved (Stinehelfer)   PAF (paroxysmal atrial fibrillation) ([place])    a. dx 12/2015 at routine physical.    Past Surgical History:  Procedure Laterality Date   ABDOMINAL AORTOGRAM W/LOWER EXTREMITY N/A 06/23/2018   Procedure: ABDOMINAL AORTOGRAM W/LOWER EXTREMITY;  Surgeon: Wellington Hampshire, MD;  Location: [facility];  Service: Cardiovascular;  Laterality: N/A;   ABI  2007   R 1.19, L 1.2   AORTIC ARCH ANGIOGRAPHY N/A 06/23/2018   Procedure: AORTIC ARCH ANGIOGRAPHY;  Surgeon: Wellington Hampshire, MD;  Location: [facility];  Service: Cardiovascular;  Laterality: N/A;   APPENDECTOMY  1995   BREAST BIOPSY Left 1997   left   BREAST BIOPSY Right 08/22/2020   [place] Bx, Q-clip, neg/ discordant   BREAST BIOPSY Right 09/05/2020   stereo biopsy/ ribbon clip/ path pending   BREAST  CYST ASPIRATION Left 02/1997   fna   BREAST EXCISIONAL BIOPSY Right 1998   benign per pt   CARDIOVASCULAR STRESS TEST  ~2003   WNL   CAROTID ENDARTERECTOMY  2002   bilateral   CAROTID STENT Right 2002   right after restenosis post CEA (Dr. Edd Arbour)   CAROTID STENT  2014   for stenosis   CAROTID STENT Left 2014   Dr. Ardeth Sportsman, [place]; 1972   COLONOSCOPY  03/2012   Jamal Collin - polyp (rec rpt 5 yrs)   [place]   for GI pain, inflamed lower GI, unrevealing workup, improved with abx   HERNIA REPAIR  2005   abdomen   MELANOMA EXCISION  1998   right shoulder   TOTAL HIP ARTHROPLASTY  2005   bilat     Current Outpatient Medications  Medication Sig Dispense Refill   Acetaminophen (TYLENOL ARTHRITIS PAIN PO) Take 1 tablet by mouth daily.     acetaminophen (TYLENOL) 500 MG tablet Take 500 mg by mouth daily.      apixaban  (ELIQUIS) 5 MG TABS tablet TAKE 1 TABLET BY MOUTH TWICE  DAILY 180 tablet 1   atorvastatin (LIPITOR) 40 MG tablet TAKE 1 TABLET BY MOUTH AT  BEDTIME 90 tablet 3   Cholecalciferol (VITAMIN D-3) 25 MCG (1000 UT) CAPS Take by mouth.     diltiazem (CARDIZEM CD) 120 MG 24 hr capsule TAKE 1 CAPSULE BY MOUTH  DAILY 90 capsule 3   enalapril (VASOTEC) 5 MG tablet TAKE 1 TABLET BY MOUTH  DAILY 90 tablet 3   fluorouracil (EFUDEX) 5 % cream as needed.     metoprolol succinate (TOPROL-XL) 100 MG 24 hr tablet TAKE 1 TABLET BY MOUTH  DAILY WITH OR IMMEDIATELY  FOLLOWING A MEAL 90 tablet 3   Multiple Vitamin (MULTIVITAMIN WITH MINERALS) TABS tablet Take 1 tablet by mouth daily.     triamcinolone cream (KENALOG) 0.1 % Apply 1 application topically daily as needed (skin cancer).     No current facility-administered medications for this visit.    Allergies:   Patient has no known allergies.    Social History:  The patient  reports that she has quit smoking. Her smoking use included cigarettes. She has a 30.00 pack-year smoking history. She has never used smokeless tobacco. She reports current alcohol use. She reports that she does not use drugs.   Family History:  The patient's ***family history includes Cancer (age of onset: 40) in her father; Cancer (age of onset: 64) in her mother; Hypertension in her paternal grandfather.    ROS:  Please see the history of present illness.   Otherwise, review of systems are positive for ***.   All other systems are reviewed and negative.    PHYSICAL EXAM: VS:  There were no vitals taken for this visit. , BMI There is no height or weight on file to calculate BMI. GEN: Well nourished, well developed, in no acute distress HEENT: normal Neck: no JVD, carotid bruits, or masses Cardiac: ***RRR; no murmurs, rubs, or gallops,no edema  Respiratory:  clear to auscultation bilaterally, normal work of breathing GI: soft, nontender, nondistended, + BS MS: no deformity or  atrophy Skin: warm and dry, no rash Neuro:  Strength and sensation are intact Psych: euthymic mood, full affect   EKG:   The ekg ordered today demonstrates ***   Recent Labs: No results found for requested labs within last 365 days.  Lipid Panel    Component Value Date/Time   CHOL 147 10/16/2020 0756   TRIG 106 10/16/2020 0756   TRIG 144 03/28/2011 0000   HDL 47 10/16/2020 0756   CHOLHDL 3.1 10/16/2020 0756   CHOLHDL 3 07/21/2019 0833   VLDL 18.0 07/21/2019 0833   LDLCALC 81 10/16/2020 0756   LDLCALC 76 02/16/2018 0924   LDLDIRECT 78 03/28/2011 0000     Other studies Reviewed: Additional studies/ records that were reviewed today with results demonstrating: ***.   ASSESSMENT AND PLAN:  AFib:  Carotid artery disease: Hypertensive heart disease: Hyperlipidemia: Anticoagulated:    Current medicines are reviewed at length with the patient today.  The patient concerns regarding her medicines were addressed.  The following changes have been made:  No change***  Labs/ tests ordered today include: *** No orders of the defined types were placed in this encounter.   Recommend 150 minutes/week of aerobic exercise Low fat, low carb, high fiber diet recommended  Disposition:   FU in ***   Signed, Larae Grooms, MD  11/17/2021 10:06 PM    [facility] [address], [place], [place]  [address] Phone: [phone]; Fax: [phone]

## 2021-11-18 ENCOUNTER — Ambulatory Visit: Payer: Medicare Other | Admitting: Interventional Cardiology

## 2021-11-18 ENCOUNTER — Encounter: Payer: Self-pay | Admitting: Interventional Cardiology

## 2021-11-18 VITALS — BP 128/78 | HR 89 | Ht 62.0 in | Wt 155.0 lb

## 2021-11-18 DIAGNOSIS — I739 Peripheral vascular disease, unspecified: Secondary | ICD-10-CM

## 2021-11-18 DIAGNOSIS — E782 Mixed hyperlipidemia: Secondary | ICD-10-CM

## 2021-11-18 DIAGNOSIS — I10 Essential (primary) hypertension: Secondary | ICD-10-CM | POA: Diagnosis not present

## 2021-11-18 DIAGNOSIS — Z7901 Long term (current) use of anticoagulants: Secondary | ICD-10-CM | POA: Diagnosis not present

## 2021-11-18 DIAGNOSIS — I4821 Permanent atrial fibrillation: Secondary | ICD-10-CM | POA: Diagnosis not present

## 2021-11-18 DIAGNOSIS — I779 Disorder of arteries and arterioles, unspecified: Secondary | ICD-10-CM

## 2021-11-18 NOTE — Patient Instructions (Signed)
Medication Instructions:  Your physician recommends that you continue on your current medications as directed. Please refer to the Current Medication list given to you today.  *If you need a refill on your cardiac medications before your next appointment, please call your pharmacy*   Lab Work: Lab work to be done today--CBC, CMET and lipids If you have labs (blood work) drawn today and your tests are completely normal, you will receive your results only by: [place] (if you have MyChart) OR A paper copy in the mail If you have any lab test that is abnormal or we need to change your treatment, we will call you to review the results.   Testing/Procedures: none   Follow-Up: At [facility], you and your health needs are our priority.  As part of our continuing mission to provide you with exceptional heart care, we have created designated Provider Care Teams.  These Care Teams include your primary Cardiologist (physician) and Advanced Practice Providers (APPs -  Physician Assistants and Nurse Practitioners) who all work together to provide you with the care you need, when you need it.  We recommend signing up for the patient portal called "MyChart".  Sign up information is provided on this After Visit Summary.  MyChart is used to connect with patients for Virtual Visits (Telemedicine).  Patients are able to view lab/test results, encounter notes, upcoming appointments, etc.  Non-urgent messages can be sent to your provider as well.   To learn more about what you can do with MyChart, go to [email or web address].    Your next appointment:   12 month(s)  The format for your next appointment:   In Person  Provider:   Larae Grooms, MD     Other Instructions   Important Information About Sugar

## 2021-11-19 LAB — CBC
Hematocrit: 47.9 % — ABNORMAL HIGH (ref 34.0–46.6)
Hemoglobin: 16.3 g/dL — ABNORMAL HIGH (ref 11.1–15.9)
MCH: 32.9 pg (ref 26.6–33.0)
MCHC: 34 g/dL (ref 31.5–35.7)
MCV: 97 fL (ref 79–97)
Platelets: 189 10*3/uL (ref 150–450)
RBC: 4.96 x10E6/uL (ref 3.77–5.28)
RDW: 14.2 % (ref 11.7–15.4)
WBC: 5.5 10*3/uL (ref 3.4–10.8)

## 2021-11-19 LAB — LIPID PANEL
Chol/HDL Ratio: 3.3 ratio (ref 0.0–4.4)
Cholesterol, Total: 126 mg/dL (ref 100–199)
HDL: 38 mg/dL — ABNORMAL LOW (ref >39)
LDL Chol Calc (NIH): 68 mg/dL (ref 0–99)
Triglycerides: 105 mg/dL (ref 0–149)
VLDL Cholesterol Cal: 20 mg/dL (ref 5–40)

## 2021-11-19 LAB — COMPREHENSIVE METABOLIC PANEL
ALT: 26 IU/L (ref 0–32)
AST: 21 IU/L (ref 0–40)
Albumin/Globulin Ratio: 1.1 — ABNORMAL LOW (ref 1.2–2.2)
Albumin: 4.2 g/dL (ref 3.7–4.7)
Alkaline Phosphatase: 131 IU/L — ABNORMAL HIGH (ref 44–121)
BUN/Creatinine Ratio: 16 (ref 12–28)
BUN: 18 mg/dL (ref 8–27)
Bilirubin Total: 1.2 mg/dL (ref 0.0–1.2)
CO2: 27 mmol/L (ref 20–29)
Calcium: 9.7 mg/dL (ref 8.7–10.3)
Chloride: 100 mmol/L (ref 96–106)
Creatinine, Ser: 1.12 mg/dL — ABNORMAL HIGH (ref 0.57–1.00)
Globulin, Total: 3.8 g/dL (ref 1.5–4.5)
Glucose: 124 mg/dL — ABNORMAL HIGH (ref 70–99)
Potassium: 4.2 mmol/L (ref 3.5–5.2)
Sodium: 139 mmol/L (ref 134–144)
Total Protein: 8 g/dL (ref 6.0–8.5)
eGFR: 49 mL/min/{1.73_m2} — ABNORMAL LOW (ref >59)

## 2021-12-01 ENCOUNTER — Other Ambulatory Visit: Payer: Self-pay | Admitting: Interventional Cardiology

## 2021-12-24 DIAGNOSIS — H2513 Age-related nuclear cataract, bilateral: Secondary | ICD-10-CM | POA: Diagnosis not present

## 2021-12-24 DIAGNOSIS — H04123 Dry eye syndrome of bilateral lacrimal glands: Secondary | ICD-10-CM | POA: Diagnosis not present

## 2021-12-24 DIAGNOSIS — H5203 Hypermetropia, bilateral: Secondary | ICD-10-CM | POA: Diagnosis not present

## 2021-12-24 DIAGNOSIS — H16223 Keratoconjunctivitis sicca, not specified as Sjogren's, bilateral: Secondary | ICD-10-CM | POA: Diagnosis not present

## 2021-12-24 DIAGNOSIS — H524 Presbyopia: Secondary | ICD-10-CM | POA: Diagnosis not present

## 2022-01-08 ENCOUNTER — Other Ambulatory Visit: Payer: Self-pay | Admitting: Cardiovascular Disease

## 2022-01-08 NOTE — Telephone Encounter (Signed)
Prescription refill request for Eliquis received. Indication:Afib Last office visit:7/23 Scr:1.1 Age: 81 Weight:70.3 kg  Prescription refilled

## 2022-01-08 NOTE — Telephone Encounter (Signed)
Refill request

## 2022-01-28 DIAGNOSIS — H47393 Other disorders of optic disc, bilateral: Secondary | ICD-10-CM | POA: Diagnosis not present

## 2022-01-28 DIAGNOSIS — H3500 Unspecified background retinopathy: Secondary | ICD-10-CM | POA: Diagnosis not present

## 2022-01-28 DIAGNOSIS — H35372 Puckering of macula, left eye: Secondary | ICD-10-CM | POA: Diagnosis not present

## 2022-01-28 DIAGNOSIS — H2513 Age-related nuclear cataract, bilateral: Secondary | ICD-10-CM | POA: Diagnosis not present

## 2022-01-29 ENCOUNTER — Other Ambulatory Visit
Admission: RE | Admit: 2022-01-29 | Discharge: 2022-01-29 | Disposition: A | Discharge status: Home / Self Care | Payer: Medicare Other | Attending: Ophthalmology | Admitting: Ophthalmology

## 2022-01-29 DIAGNOSIS — H3500 Unspecified background retinopathy: Secondary | ICD-10-CM | POA: Insufficient documentation

## 2022-01-29 DIAGNOSIS — R7303 Prediabetes: Secondary | ICD-10-CM | POA: Insufficient documentation

## 2022-01-29 LAB — HEMOGLOBIN A1C
Hgb A1c MFr Bld: 5.9 % — ABNORMAL HIGH (ref 4.8–5.6)
Mean Plasma Glucose: 122.63 mg/dL

## 2022-01-29 LAB — GLUCOSE, RANDOM: Glucose, Bld: 113 mg/dL — ABNORMAL HIGH (ref 70–99)

## 2022-01-30 ENCOUNTER — Telehealth: Payer: Self-pay

## 2022-01-30 NOTE — Telephone Encounter (Signed)
Patient states she went to [facility] yesterday and had an eye exam for cataracts.  Patient states they ran a test for glucose and it came back 113, and doctor told it should normally be 100.  Patient states Dr. Lazarus Salines asked her if her PCP was going to prescribe any medication for this or just wait.  Patient states she also has dry eye.

## 2022-02-03 NOTE — Telephone Encounter (Signed)
Labs indicate she has prediabetes A1c 5.9 rec healthy diet and exercise  Pt does not need medication  Pt not seen since 06/2019 sch appt with me if I have one before I leave or Dr. Volanda Napoleon   Thank you

## 2022-02-11 ENCOUNTER — Ambulatory Visit: Payer: Medicare Other | Admitting: Cardiovascular Disease

## 2022-02-27 DIAGNOSIS — H3500 Unspecified background retinopathy: Secondary | ICD-10-CM | POA: Diagnosis not present

## 2022-03-06 DIAGNOSIS — H35052 Retinal neovascularization, unspecified, left eye: Secondary | ICD-10-CM | POA: Diagnosis not present

## 2022-03-10 DIAGNOSIS — H353221 Exudative age-related macular degeneration, left eye, with active choroidal neovascularization: Secondary | ICD-10-CM | POA: Diagnosis not present

## 2022-03-25 ENCOUNTER — Ambulatory Visit: Payer: Medicare Other | Admitting: Cardiovascular Disease

## 2022-04-10 NOTE — Progress Notes (Signed)
Cardiology Clinic Note   Patient Name: [name] Date of Encounter: 04/11/2022  Primary Care Provider:  McLean-Scocuzza, Nino Glow, MD Primary Cardiologist:  Larae Grooms, MD  Patient Profile    81 year-old female with a history of peripheral arterial disease, carotid arterial disease, permanent atrial fibrillation, hyperlipidemia, hypertension, and subclavian stenosis, who is here today for follow-up.  Past Medical History    Past Medical History:  Diagnosis Date   Arthritis    hands, knees, ankles   BCC (basal cell carcinoma of skin) 2016   Stinehelfer   Bilateral carotid artery stenosis    a. s/p right carotid stent after restenosis post CEA, left carotid stent (previously on Plavix for these). b. Carotid US 03/2016: stable 40-59% BICA.   Fibrocystic breast disease    followed by surgery Jamal Collin)   GERD (gastroesophageal reflux disease)    OTC prilosec   Hernia 2005   History of chicken pox    History of colon polyps    History of melanoma 1990s   right shoulder   HLD (hyperlipidemia)    HTN (hypertension) 1970s   Hypertensive retinopathy of both eyes, grade 1 2017   Bulakowski   Kidney cyst, acquired 2016   large R septated 8cm, stable 2017   Melanoma in situ ([place]) 2017   R medial posterior arm - lentio maligna type, margin involved (Stinehelfer)   PAF (paroxysmal atrial fibrillation) ([place])    a. dx 12/2015 at routine physical.   Past Surgical History:  Procedure Laterality Date   ABDOMINAL AORTOGRAM W/LOWER EXTREMITY N/A 06/23/2018   Procedure: ABDOMINAL AORTOGRAM W/LOWER EXTREMITY;  Surgeon: Wellington Hampshire, MD;  Location: [facility];  Service: Cardiovascular;  Laterality: N/A;   ABI  2007   R 1.19, L 1.2   AORTIC ARCH ANGIOGRAPHY N/A 06/23/2018   Procedure: AORTIC ARCH ANGIOGRAPHY;  Surgeon: Wellington Hampshire, MD;  Location: [facility];  Service: Cardiovascular;  Laterality: N/A;   APPENDECTOMY  1995   BREAST BIOPSY Left 1997    left   BREAST BIOPSY Right 08/22/2020   [place] Bx, Q-clip, neg/ discordant   BREAST BIOPSY Right 09/05/2020   stereo biopsy/ ribbon clip/ path pending   BREAST CYST ASPIRATION Left 02/1997   fna   BREAST EXCISIONAL BIOPSY Right 1998   benign per pt   CARDIOVASCULAR STRESS TEST  ~2003   WNL   CAROTID ENDARTERECTOMY  2002   bilateral   CAROTID STENT Right 2002   right after restenosis post CEA (Dr. Edd Arbour)   CAROTID STENT  2014   for stenosis   CAROTID STENT Left 2014   Dr. Ardeth Sportsman, [place]; 1972   COLONOSCOPY  03/2012   Jamal Collin - polyp (rec rpt 5 yrs)   [place]   for GI pain, inflamed lower GI, unrevealing workup, improved with abx   HERNIA REPAIR  2005   abdomen   MELANOMA EXCISION  1998   right shoulder   TOTAL HIP ARTHROPLASTY  2005   bilat    Allergies  No Known Allergies  History of Present Illness    [name] is a 81 year-old female with the previously mentioned past medical history of peripheral arterial disease, carotid disease, bilateral subclavian stenosis, permanent atrial fibrillation on chronic anticoagulation with apixaban, hypertension, and hyperlipidemia.   She has an extensive history of vascular disease followed in the past by Dr. Irish Lack locally for her  carotid disease (was remotely followed by [facility])- carotid duplex 10/18/2021 with >50% ECA stenosis with widely patent RICA and LICA stent,  She had a right carotid stent after restenosis post CEA. Left carotid stenting was done in 05/2012. She was seen in 2019 for bilateral calf pain as well as arm discomfort with exertion. She had noninvasive vascular evaluation done which showed an ABI of 0.88 on the right ans 0.91 on the left but it was difficult to interpret given the pressure in the arms was abnormal. Duplex showed significant right common femoral artery disease with moderate SFA disease and occluded popliteal artery. On the left was severe disease affecting the  left distal SFA. Duplex of the upper extremities showed severe subclavian disease bilaterally worse on the the left side. Aortic arch and lower extremity angiography was performed in February 2020. There was moderate right subclavian artery stenosis at the origin of the [place]. There was significant stenosis affecting the left proximal axillary artery. The right lower extremity had significant calcified common femoral artery disease extending into the ostium of the SFA that occluded the popliteal artery but there was two-vessel runoff below the knee. The left had borderline significant common iliac artery stenosis with diffuse moderate disease the entire length of the left SFA with severe focal stenosis with three vessel run-off below the knee.   She was last seen in clinic 11/27/2020 by Dr Fletcher Anon. She had been doing well overall. She did report worsening claudication symptoms but it did not impact her ADL's. Her ABI's were repeated and were considered to be stable. She was encouraged to continue with her exercise program and current medical therapy.  She returns to clinic today stating that overall she has been doing fairly well.  She denies any discomfort or pain to her bilateral lower extremities.  She has been able to play golf and pickleball.  She stated that her pain diminished once she was becoming more active.  She continues to clinical call course and does continue to walk her dog on the cart path as well.  She states that she has been having some issues with her vision as she is only able to see out of 1.  She states that she had gone to see her ophthalmologist and there was a blood vessel that burst in the back of 1 she is currently receiving injections.  She states that she has 2 more injections to be safe and has not noted any improvement or restoration of her vision in that eye as of yet.  She denies any chest pain, shortness of breath, palpitations or claudication today.  She also denies any recent  hospitalizations or visits to the emergency department.  Home Medications    Current Outpatient Medications  Medication Sig Dispense Refill   Acetaminophen (TYLENOL ARTHRITIS PAIN PO) Take 1 tablet by mouth daily.     acetaminophen (TYLENOL) 500 MG tablet Take 500 mg by mouth daily.      atorvastatin (LIPITOR) 40 MG tablet TAKE 1 TABLET BY MOUTH AT  BEDTIME 90 tablet 3   Cholecalciferol (VITAMIN D-3) 25 MCG (1000 UT) CAPS Take by mouth.     diltiazem (CARDIZEM CD) 120 MG 24 hr capsule TAKE 1 CAPSULE BY MOUTH  DAILY 90 capsule 3   ELIQUIS 5 MG TABS tablet TAKE 1 TABLET BY MOUTH TWICE  DAILY 180 tablet 3   enalapril (VASOTEC) 5 MG tablet TAKE 1 TABLET BY MOUTH  DAILY 90 tablet 3   fluorouracil (EFUDEX) 5 %  cream as needed (skin cancer).     metoprolol succinate (TOPROL-XL) 100 MG 24 hr tablet TAKE 1 TABLET BY MOUTH  DAILY WITH OR IMMEDIATELY  FOLLOWING A MEAL 90 tablet 3   Multiple Vitamin (MULTIVITAMIN WITH MINERALS) TABS tablet Take 1 tablet by mouth daily.     Omega-3 Fatty Acids (FISH OIL) 1200 MG CAPS Take 1,200 mg by mouth daily.     triamcinolone cream (KENALOG) 0.1 % Apply 1 application topically daily as needed (skin cancer).     No current facility-administered medications for this visit.     Family History    Family History  Problem Relation Age of Onset   Cancer Mother 15       lung, smoker   Cancer Father 33       prostate   Hypertension Paternal Grandfather    CAD Neg Hx    Stroke Neg Hx    Diabetes Neg Hx    Heart attack Neg Hx    Breast cancer Neg Hx    She indicated that her mother is deceased. She indicated that her father is deceased. She indicated that her brother is alive. She indicated that her maternal grandmother is deceased. She indicated that her maternal grandfather is deceased. She indicated that her paternal grandmother is deceased. She indicated that her paternal grandfather is deceased. She indicated that her daughter is alive. She indicated that her  son is alive. She indicated that the status of her neg hx is unknown.  Social History    Social History   Socioeconomic History   Marital status: Widowed    Spouse name: Not on file   Number of children: Not on file   Years of education: Not on file   Highest education level: Not on file  Occupational History   Not on file  Tobacco Use   Smoking status: Former    Packs/day: 1.00    Years: 30.00    Total pack years: 30.00    Types: Cigarettes   Smokeless tobacco: Never  Vaping Use   Vaping Use: Never used  Substance and Sexual Activity   Alcohol use: Yes    Alcohol/week: 0.0 standard drinks of alcohol    Comment: 1 glass wine daily   Drug use: No   Sexual activity: Not on file  Other Topics Concern   Not on file  Social History Narrative   Caffeine: 3 cups coffee/day   Widow of husband Jori Moll) died in 08-02-2013 cancer, lives with 1 dog.  Grown children   Occupation: homemaker, used to be Catering manager   Edu: college   Activity: plays golf, walks dog, swims   Diet: good water, fruits/vegetables daily   Former smoker age 60 to 1958/9 1 pk lasted 1 week    Social Determinants of Health   Financial Resource Strain: [place]  (06/28/2020)   Overall Financial Resource Strain (CARDIA)    Difficulty of Paying Living Expenses: Not hard at all  Food Insecurity: No Food Insecurity (06/28/2020)   Hunger Vital Sign    Worried About Running Out of Food in the Last Year: Never true    [place] in the Last Year: Never true  Transportation Needs: No Transportation Needs (06/28/2020)   PRAPARE - Hydrologist (Medical): No    Lack of Transportation (Non-Medical): No  Physical Activity: Sufficiently Active (06/28/2020)   Exercise Vital Sign    Days of Exercise per Week: 3 days  Minutes of Exercise per Session: 60 min  Stress: No Stress Concern Present (06/28/2020)   [place]     Feeling of Stress : Not at all  Social Connections: Unknown (06/28/2020)   Social Connection and Isolation Panel [NHANES]    Frequency of Communication with Friends and Family: Not on file    Frequency of Social Gatherings with Friends and Family: Three times a week    Attends Religious Services: Not on file    Active Member of Clubs or Organizations: Not on file    Attends Club or Organization Meetings: Not on file    Marital Status: Not on file  Intimate Partner Violence: Not At Risk (06/28/2020)   Humiliation, Afraid, Rape, and Kick questionnaire    Fear of Current or Ex-Partner: No    Emotionally Abused: No    Physically Abused: No    Sexually Abused: No     Review of Systems    General:  No chills, fever, night sweats or weight changes.  Cardiovascular:  No chest pain, dyspnea on exertion, edema, orthopnea, palpitations, paroxysmal nocturnal dyspnea. Dermatological: No rash, lesions/masses Respiratory: No cough, dyspnea Urologic: No hematuria, dysuria Abdominal:   No nausea, vomiting, diarrhea, bright red blood per rectum, melena, or hematemesis Neurologic: Endorses visual changes, but denies wkns, changes in mental status. All other systems reviewed and are otherwise negative except as noted above.   Physical Exam    VS:  BP 127/85 (BP Location: Left Arm, Patient Position: Sitting, Cuff Size: Normal)   Pulse (!) 113   Ht '5\' 2"'$  (1.575 m)   Wt 150 lb 12.8 oz (68.4 kg)   SpO2 93%   BMI 27.58 kg/m  , BMI Body mass index is 27.58 kg/m.     GEN: Well nourished, well developed, in no acute distress. HEENT: normal. Neck: Supple, no JVD, carotid bruits, or masses. Cardiac: Irregularly irregular, no murmurs, rubs, or gallops. No clubbing, cyanosis, edema.  Radials 1+/femoral 1+/distal pulses are not palpable Respiratory:  Respirations regular and unlabored, clear to auscultation bilaterally. GI: Soft, nontender, nondistended, BS + x 4. MS: no deformity or atrophy. Skin: warm  and dry, no rash. Neuro:  Strength and sensation are intact. Psych: Normal affect.  Accessory Clinical Findings    ECG personally reviewed by me today-rate controlled atrial fibrillation- No acute changes  Lab Results  Component Value Date   WBC 5.5 11/18/2021   HGB 16.3 (H) 11/18/2021   HCT 47.9 (H) 11/18/2021   MCV 97 11/18/2021   PLT 189 11/18/2021   Lab Results  Component Value Date   CREATININE 1.12 (H) 11/18/2021   BUN 18 11/18/2021   NA 139 11/18/2021   K 4.2 11/18/2021   CL 100 11/18/2021   CO2 27 11/18/2021   Lab Results  Component Value Date   ALT 26 11/18/2021   AST 21 11/18/2021   ALKPHOS 131 (H) 11/18/2021   BILITOT 1.2 11/18/2021   Lab Results  Component Value Date   CHOL 126 11/18/2021   HDL 38 (L) 11/18/2021   LDLCALC 68 11/18/2021   LDLDIRECT 78 03/28/2011   TRIG 105 11/18/2021   CHOLHDL 3.3 11/18/2021    Lab Results  Component Value Date   HGBA1C 5.9 (H) 01/29/2022    Assessment & Plan   1.  Peripheral arterial disease with resolution of claudication with exercise regimen. Repeat ABIs were completed 12/10/2020 which was stable and practically unchanged from prior studies.  So she  is encouraged to continue with her exercise regimen.  She is also continued on statin and Eliquis and lieu of aspirin.  2.  Carotid disease status post stenting to the right and left carotid artery in 2014.  Carotid duplex completed 10/18/2021 showed patent carotid stents and bilateral ECA> 50% without intervention needed at this time repeat study will be in 12 months.  She continues to follow with  Dr. Irish Lack.  3.  Permanent atrial fibrillation status ventricularly rate controlled.  She is continued on Eliquis 5 mg twice daily and diltiazem 120 mg daily as well as as Toprol-XL 100 mg daily.  She has had no issues with bleeding on Eliquis.  She is currently asymptomatic to being in atrial fibrillation today on EKG.  4.  Hyperlipidemia with last LDL of 68 11/18/2021.  She  has been continued on atorvastatin.  5.  Bilateral subclavian stenosis without significant arm claudication.  She is continued on current medical therapy.  6.  Hypertension with blood pressure today 127/85.  She has been continued on her current medication regimen with no refills needed today.  7.  Disposition patient return to clinic to see MD/APP in 12 months or sooner if needed with EKG on return.  Tyechia Allmendinger, NP 04/11/2022, 10:05 AM

## 2022-04-11 ENCOUNTER — Encounter: Payer: Self-pay | Admitting: Cardiology

## 2022-04-11 ENCOUNTER — Ambulatory Visit: Payer: Medicare Other | Attending: Cardiovascular Disease | Admitting: Cardiology

## 2022-04-11 VITALS — BP 127/85 | HR 113 | Ht 62.0 in | Wt 150.8 lb

## 2022-04-11 DIAGNOSIS — I4821 Permanent atrial fibrillation: Secondary | ICD-10-CM

## 2022-04-11 DIAGNOSIS — E782 Mixed hyperlipidemia: Secondary | ICD-10-CM | POA: Diagnosis not present

## 2022-04-11 DIAGNOSIS — I739 Peripheral vascular disease, unspecified: Secondary | ICD-10-CM | POA: Diagnosis not present

## 2022-04-11 DIAGNOSIS — I10 Essential (primary) hypertension: Secondary | ICD-10-CM

## 2022-04-11 DIAGNOSIS — I771 Stricture of artery: Secondary | ICD-10-CM

## 2022-04-11 DIAGNOSIS — I779 Disorder of arteries and arterioles, unspecified: Secondary | ICD-10-CM | POA: Diagnosis not present

## 2022-04-11 NOTE — Patient Instructions (Signed)
Medication Instructions:  No changes *If you need a refill on your cardiac medications before your next appointment, please call your pharmacy*   Lab Work: None ordered If you have labs (blood work) drawn today and your tests are completely normal, you will receive your results only by: [place] (if you have MyChart) OR A paper copy in the mail If you have any lab test that is abnormal or we need to change your treatment, we will call you to review the results.   Testing/Procedures: None ordered   Follow-Up: At [facility], you and your health needs are our priority.  As part of our continuing mission to provide you with exceptional heart care, we have created designated Provider Care Teams.  These Care Teams include your primary Cardiologist (physician) and Advanced Practice Providers (APPs -  Physician Assistants and Nurse Practitioners) who all work together to provide you with the care you need, when you need it.  We recommend signing up for the patient portal called "MyChart".  Sign up information is provided on this After Visit Summary.  MyChart is used to connect with patients for Virtual Visits (Telemedicine).  Patients are able to view lab/test results, encounter notes, upcoming appointments, etc.  Non-urgent messages can be sent to your provider as well.   To learn more about what you can do with MyChart, go to [email or web address].    Your next appointment:   12 month(s)  The format for your next appointment:   In Person  Provider:   You may see Dr. Fletcher Anon or one of the following Advanced Practice Providers on your designated Care Team:   Murray Hodgkins, NP Christell Faith, PA-C Cadence Kathlen Mody, PA-C Gerrie Nordmann, NP    Important Information About Sugar

## 2022-04-22 DIAGNOSIS — H353221 Exudative age-related macular degeneration, left eye, with active choroidal neovascularization: Secondary | ICD-10-CM | POA: Diagnosis not present

## 2022-05-13 ENCOUNTER — Ambulatory Visit (INDEPENDENT_AMBULATORY_CARE_PROVIDER_SITE_OTHER): Payer: Medicare Other | Admitting: Nurse Practitioner

## 2022-05-13 ENCOUNTER — Encounter: Payer: Self-pay | Admitting: Nurse Practitioner

## 2022-05-13 VITALS — BP 114/60 | HR 104 | Temp 98.3°F | Ht 62.0 in | Wt 149.8 lb

## 2022-05-13 DIAGNOSIS — I119 Hypertensive heart disease without heart failure: Secondary | ICD-10-CM | POA: Diagnosis not present

## 2022-05-13 DIAGNOSIS — Z0000 Encounter for general adult medical examination without abnormal findings: Secondary | ICD-10-CM

## 2022-05-13 DIAGNOSIS — Z1329 Encounter for screening for other suspected endocrine disorder: Secondary | ICD-10-CM | POA: Diagnosis not present

## 2022-05-13 DIAGNOSIS — N1831 Chronic kidney disease, stage 3a: Secondary | ICD-10-CM

## 2022-05-13 DIAGNOSIS — R7303 Prediabetes: Secondary | ICD-10-CM | POA: Diagnosis not present

## 2022-05-13 DIAGNOSIS — E785 Hyperlipidemia, unspecified: Secondary | ICD-10-CM | POA: Diagnosis not present

## 2022-05-13 DIAGNOSIS — I158 Other secondary hypertension: Secondary | ICD-10-CM

## 2022-05-13 DIAGNOSIS — I4821 Permanent atrial fibrillation: Secondary | ICD-10-CM

## 2022-05-13 LAB — COMPREHENSIVE METABOLIC PANEL
ALT: 25 U/L (ref 0–35)
AST: 25 U/L (ref 0–37)
Albumin: 4.3 g/dL (ref 3.5–5.2)
Alkaline Phosphatase: 115 U/L (ref 39–117)
BUN: 19 mg/dL (ref 6–23)
CO2: 28 mEq/L (ref 19–32)
Calcium: 9.7 mg/dL (ref 8.4–10.5)
Chloride: 99 mEq/L (ref 96–112)
Creatinine, Ser: 0.98 mg/dL (ref 0.40–1.20)
GFR: 53.37 mL/min — ABNORMAL LOW (ref >60.00)
Glucose, Bld: 126 mg/dL — ABNORMAL HIGH (ref 70–99)
Potassium: 4.1 mEq/L (ref 3.5–5.1)
Sodium: 138 mEq/L (ref 135–145)
Total Bilirubin: 1.1 mg/dL (ref 0.2–1.2)
Total Protein: 8 g/dL (ref 6.0–8.3)

## 2022-05-13 LAB — CBC WITH DIFFERENTIAL/PLATELET
Basophils Absolute: 0 10*3/uL (ref 0.0–0.1)
Basophils Relative: 1 % (ref 0.0–3.0)
Eosinophils Absolute: 0.1 10*3/uL (ref 0.0–0.7)
Eosinophils Relative: 1.1 % (ref 0.0–5.0)
HCT: 47.8 % — ABNORMAL HIGH (ref 36.0–46.0)
Hemoglobin: 16 g/dL — ABNORMAL HIGH (ref 12.0–15.0)
Lymphocytes Relative: 26.2 % (ref 12.0–46.0)
Lymphs Abs: 1.2 10*3/uL (ref 0.7–4.0)
MCHC: 33.5 g/dL (ref 30.0–36.0)
MCV: 100.6 fl — ABNORMAL HIGH (ref 78.0–100.0)
Monocytes Absolute: 0.4 10*3/uL (ref 0.1–1.0)
Monocytes Relative: 8.8 % (ref 3.0–12.0)
Neutro Abs: 3 10*3/uL (ref 1.4–7.7)
Neutrophils Relative %: 62.9 % (ref 43.0–77.0)
Platelets: 163 10*3/uL (ref 150.0–400.0)
RBC: 4.75 Mil/uL (ref 3.87–5.11)
RDW: 15.2 % (ref 11.5–15.5)
WBC: 4.7 10*3/uL (ref 4.0–10.5)

## 2022-05-13 LAB — TSH: TSH: 2.48 u[IU]/mL (ref 0.35–5.50)

## 2022-05-13 LAB — LIPID PANEL
Cholesterol: 115 mg/dL (ref 0–200)
HDL: 36.6 mg/dL — ABNORMAL LOW (ref >39.00)
LDL Cholesterol: 59 mg/dL (ref 0–99)
NonHDL: 78.87
Total CHOL/HDL Ratio: 3
Triglycerides: 97 mg/dL (ref 0.0–149.0)
VLDL: 19.4 mg/dL (ref 0.0–40.0)

## 2022-05-13 LAB — HEMOGLOBIN A1C: Hgb A1c MFr Bld: 6 % (ref 4.6–6.5)

## 2022-05-13 NOTE — Assessment & Plan Note (Addendum)
Chronic. Stable on Lipitor. Patient also taking Fish Oil. Will check lipids today. Encouraged healthy diet and exercise.

## 2022-05-13 NOTE — Assessment & Plan Note (Signed)
Last A1c- 5.9, will recheck today. Encouraged healthy diet and exercise.

## 2022-05-13 NOTE — Progress Notes (Signed)
Rebecca Morrow, NP-C Phone: [phone]  [name] is a 82 y.o. female who presents today for transfer of care. She has no complaints or new concerns today. She is closely followed by Cardiology.  HYPERTENSION Disease Monitoring Home BP Monitoring- Checks daily: 120-128/84-87 Chest pain- No    Dyspnea- No Medications Compliance- Takes daily. Lightheadedness-  No  Edema- No BMET    Component Value Date/Time   NA 139 11/18/2021 1054   K 4.2 11/18/2021 1054   K 3.9 06/26/2011 0906   CL 100 11/18/2021 1054   CO2 27 11/18/2021 1054   GLUCOSE 113 (H) 01/29/2022 0833   BUN 18 11/18/2021 1054   CREATININE 1.12 (H) 11/18/2021 1054   CREATININE 1.12 (H) 02/16/2018 0924   CREATININE 1.12 (H) 02/16/2018 0924   CALCIUM 9.7 11/18/2021 1054   GFRNONAA 51 (L) 06/15/2018 0000   GFRNONAA 47 (L) 02/16/2018 0924   GFRAA 59 (L) 06/15/2018 0000   GFRAA 54 (L) 02/16/2018 0924    HYPERLIPIDEMIA Symptoms Chest pain on exertion:  No   Leg claudication:   No Medications: Compliance- Takes daily Right upper quadrant pain- No  Muscle aches- No Lipid Panel     Component Value Date/Time   CHOL 126 11/18/2021 1054   TRIG 105 11/18/2021 1054   TRIG 144 03/28/2011 0000   HDL 38 (L) 11/18/2021 1054   CHOLHDL 3.3 11/18/2021 1054   CHOLHDL 3 07/21/2019 0833   VLDL 18.0 07/21/2019 0833   LDLCALC 68 11/18/2021 1054   LDLCALC 76 02/16/2018 0924   LDLDIRECT 78 03/28/2011 0000   LABVLDL 20 11/18/2021 1054     A-Fib:  Patient denies any new palpitations or problems with bleeding. She takes her medications daily. She last saw Cardiology on 04/11/2022, note reviewed.   She is currently seeing [place] and being followed closely for injections in her eyes.    Social History   Tobacco Use  Smoking Status Former   Packs/day: 1.00   Years: 30.00   Total pack years: 30.00   Types: Cigarettes  Smokeless Tobacco Never    Current Outpatient Medications on File Prior to Visit   Medication Sig Dispense Refill   Acetaminophen (TYLENOL ARTHRITIS PAIN PO) Take 1 tablet by mouth daily.     acetaminophen (TYLENOL) 500 MG tablet Take 500 mg by mouth daily.      atorvastatin (LIPITOR) 40 MG tablet TAKE 1 TABLET BY MOUTH AT  BEDTIME 90 tablet 3   Cholecalciferol (VITAMIN D-3) 25 MCG (1000 UT) CAPS Take by mouth.     diltiazem (CARDIZEM CD) 120 MG 24 hr capsule TAKE 1 CAPSULE BY MOUTH  DAILY 90 capsule 3   ELIQUIS 5 MG TABS tablet TAKE 1 TABLET BY MOUTH TWICE  DAILY 180 tablet 3   enalapril (VASOTEC) 5 MG tablet TAKE 1 TABLET BY MOUTH  DAILY 90 tablet 3   fluorouracil (EFUDEX) 5 % cream as needed (skin cancer).     metoprolol succinate (TOPROL-XL) 100 MG 24 hr tablet TAKE 1 TABLET BY MOUTH  DAILY WITH OR IMMEDIATELY  FOLLOWING A MEAL 90 tablet 3   Multiple Vitamin (MULTIVITAMIN WITH MINERALS) TABS tablet Take 1 tablet by mouth daily.     Omega-3 Fatty Acids (FISH OIL) 1200 MG CAPS Take 1,200 mg by mouth daily.     triamcinolone cream (KENALOG) 0.1 % Apply 1 application topically daily as needed (skin cancer).     No current facility-administered medications on file prior to visit.  ROS see history of present illness  Objective  Physical Exam Vitals:   05/13/22 0901  BP: 114/60  Pulse: (!) 104  Temp: 98.3 F (36.8 C)  SpO2: 96%    BP Readings from Last 3 Encounters:  05/13/22 114/60  04/11/22 127/85  11/18/21 128/78   Wt Readings from Last 3 Encounters:  05/13/22 149 lb 12.8 oz (67.9 kg)  04/11/22 150 lb 12.8 oz (68.4 kg)  11/18/21 155 lb (70.3 kg)    Physical Exam Constitutional:      General: She is not in acute distress.    Appearance: Normal appearance.  HENT:     Head: Normocephalic.  Cardiovascular:     Rate and Rhythm: Rhythm irregular.     Heart sounds: No murmur heard.    No friction rub. No gallop.  Pulmonary:     Effort: Pulmonary effort is normal. No respiratory distress.     Breath sounds: Normal breath sounds.   Musculoskeletal:        General: No swelling.  Skin:    General: Skin is warm and dry.  Neurological:     General: No focal deficit present.     Mental Status: She is alert.  Psychiatric:        Mood and Affect: Mood normal.        Behavior: Behavior normal.     Assessment/Plan: Please see individual problem list.  Hypertensive heart disease, unspecified whether heart failure present Assessment & Plan: Chronic. Stable on current medication regimen, Vasotec and Toprol XL. Follow up with Cardiology.   Orders: -     CBC with Differential/Platelet -     Comprehensive metabolic panel  Permanent atrial fibrillation (HCC) Assessment & Plan: Chronic. Stable on Eliquis and Diltiazem. Will monitor for new palpitations and rate changes. Follow up with Cardiology.   Orders: -     CBC with Differential/Platelet -     Comprehensive metabolic panel  Prediabetes Assessment & Plan: Last A1c- 5.9, will recheck today. Encouraged healthy diet and exercise.   Orders: -     Lipid panel -     Hemoglobin A1c  Hyperlipidemia, unspecified hyperlipidemia type Assessment & Plan: Chronic. Stable on Lipitor. Patient also taking Fish Oil. Will check lipids today. Encouraged healthy diet and exercise.   Orders: -     Lipid panel  Thyroid disorder screen -     TSH    Return in about 6 months (around 11/11/2022).   Rebecca Morrow, NP-C [name]

## 2022-05-13 NOTE — Assessment & Plan Note (Addendum)
Chronic. Stable on current medication regimen, Vasotec and Toprol XL. Follow up with Cardiology.

## 2022-05-13 NOTE — Assessment & Plan Note (Addendum)
Chronic. Stable on Eliquis and Diltiazem. Will monitor for new palpitations and rate changes. Follow up with Cardiology.

## 2022-05-27 DIAGNOSIS — H353221 Exudative age-related macular degeneration, left eye, with active choroidal neovascularization: Secondary | ICD-10-CM | POA: Diagnosis not present

## 2022-07-08 DIAGNOSIS — E119 Type 2 diabetes mellitus without complications: Secondary | ICD-10-CM | POA: Diagnosis not present

## 2022-07-08 DIAGNOSIS — H353221 Exudative age-related macular degeneration, left eye, with active choroidal neovascularization: Secondary | ICD-10-CM | POA: Diagnosis not present

## 2022-07-08 DIAGNOSIS — H2513 Age-related nuclear cataract, bilateral: Secondary | ICD-10-CM | POA: Diagnosis not present

## 2022-07-21 ENCOUNTER — Other Ambulatory Visit: Payer: Self-pay | Admitting: Nurse Practitioner

## 2022-07-21 DIAGNOSIS — Z1231 Encounter for screening mammogram for malignant neoplasm of breast: Secondary | ICD-10-CM

## 2022-07-31 DIAGNOSIS — H35042 Retinal micro-aneurysms, unspecified, left eye: Secondary | ICD-10-CM | POA: Diagnosis not present

## 2022-07-31 DIAGNOSIS — H2513 Age-related nuclear cataract, bilateral: Secondary | ICD-10-CM | POA: Diagnosis not present

## 2022-07-31 DIAGNOSIS — M3501 Sicca syndrome with keratoconjunctivitis: Secondary | ICD-10-CM | POA: Diagnosis not present

## 2022-07-31 DIAGNOSIS — H16143 Punctate keratitis, bilateral: Secondary | ICD-10-CM | POA: Diagnosis not present

## 2022-08-02 ENCOUNTER — Other Ambulatory Visit: Payer: Self-pay | Admitting: Interventional Cardiology

## 2022-08-12 ENCOUNTER — Ambulatory Visit
Admission: RE | Admit: 2022-08-12 | Discharge: 2022-08-12 | Disposition: A | Discharge status: Home / Self Care | Payer: Medicare Other | Source: Ambulatory Visit | Attending: Nurse Practitioner | Admitting: Nurse Practitioner

## 2022-08-12 DIAGNOSIS — Z1231 Encounter for screening mammogram for malignant neoplasm of breast: Secondary | ICD-10-CM | POA: Diagnosis not present

## 2022-08-16 ENCOUNTER — Other Ambulatory Visit: Payer: Self-pay | Admitting: Interventional Cardiology

## 2022-08-18 NOTE — Telephone Encounter (Signed)
Rx refill sent to pharmacy. 

## 2022-08-19 ENCOUNTER — Telehealth: Payer: Self-pay | Admitting: Nurse Practitioner

## 2022-08-19 NOTE — Telephone Encounter (Signed)
Contacted [name] to schedule their annual wellness visit. Appointment made for 08/21/2022.  Thank you,  [facility] Support [facility] Direct dial  [phone]

## 2022-08-21 ENCOUNTER — Ambulatory Visit (INDEPENDENT_AMBULATORY_CARE_PROVIDER_SITE_OTHER): Payer: Medicare Other

## 2022-08-21 VITALS — Ht 62.0 in | Wt 149.0 lb

## 2022-08-21 DIAGNOSIS — Z0000 Encounter for general adult medical examination without abnormal findings: Secondary | ICD-10-CM

## 2022-08-21 NOTE — Patient Instructions (Addendum)
[name] , Thank you for taking time to come for your Medicare Wellness Visit. I appreciate your ongoing commitment to your health goals. Please review the following plan we discussed and let me know if I can assist you in the future.   These are the goals we discussed:  Goals      Follow up with Primary Care Provider     As needed        This is a list of the screening recommended for you and due dates:  Health Maintenance  Topic Date Due   COVID-19 Vaccine (4 - 2023-24 season) 09/06/2022*   DTaP/Tdap/Td vaccine (2 - Tdap) 10/14/2022   Flu Shot  12/11/2022   Medicare Annual Wellness Visit  08/21/2023   Pneumonia Vaccine  Completed   DEXA scan (bone density measurement)  Completed   Zoster (Shingles) Vaccine  Completed   HPV Vaccine  Aged Out  *Topic was postponed. The date shown is not the original due date.    Advanced directives: End of life planning; Advance aging; Advanced directives discussed.  Copy of current HCPOA/Living Will requested.    Conditions/risks identified: none new  Next appointment: Follow up in one year for your annual wellness visit    Preventive Care 65 Years and Older, Female Preventive care refers to lifestyle choices and visits with your health care provider that can promote health and wellness. What does preventive care include? A yearly physical exam. This is also called an annual well check. Dental exams once or twice a year. Routine eye exams. Ask your health care provider how often you should have your eyes checked. Personal lifestyle choices, including: Daily care of your teeth and gums. Regular physical activity. Eating a healthy diet. Avoiding tobacco and drug use. Limiting alcohol use. Practicing safe sex. Taking low-dose aspirin every day. Taking vitamin and mineral supplements as recommended by your health care provider. What happens during an annual well check? The services and screenings done by your health care provider  during your annual well check will depend on your age, overall health, lifestyle risk factors, and family history of disease. Counseling  Your health care provider may ask you questions about your: Alcohol use. Tobacco use. Drug use. Emotional well-being. Home and relationship well-being. Sexual activity. Eating habits. History of falls. Memory and ability to understand (cognition). Work and work Astronomer. Reproductive health. Screening  You may have the following tests or measurements: Height, weight, and BMI. Blood pressure. Lipid and cholesterol levels. These may be checked every 5 years, or more frequently if you are over 6 years old. Skin check. Lung cancer screening. You may have this screening every year starting at age 31 if you have a 30-pack-year history of smoking and currently smoke or have quit within the past 15 years. Fecal occult blood test (FOBT) of the stool. You may have this test every year starting at age 78. Flexible sigmoidoscopy or colonoscopy. You may have a sigmoidoscopy every 5 years or a colonoscopy every 10 years starting at age 42. Hepatitis C blood test. Hepatitis B blood test. Sexually transmitted disease (STD) testing. Diabetes screening. This is done by checking your blood sugar (glucose) after you have not eaten for a while (fasting). You may have this done every 1-3 years. Bone density scan. This is done to screen for osteoporosis. You may have this done starting at age 57. Mammogram. This may be done every 1-2 years. Talk to your health care provider about how often you should have  regular mammograms. Talk with your health care provider about your test results, treatment options, and if necessary, the need for more tests. Vaccines  Your health care provider may recommend certain vaccines, such as: Influenza vaccine. This is recommended every year. Tetanus, diphtheria, and acellular pertussis (Tdap, Td) vaccine. You may need a Td booster every  10 years. Zoster vaccine. You may need this after age 31. Pneumococcal 13-valent conjugate (PCV13) vaccine. One dose is recommended after age 35. Pneumococcal polysaccharide (PPSV23) vaccine. One dose is recommended after age 29. Talk to your health care provider about which screenings and vaccines you need and how often you need them. This information is not intended to replace advice given to you by your health care provider. Make sure you discuss any questions you have with your health care provider. Document Released: 05/25/2015 Document Revised: 01/16/2016 Document Reviewed: 02/27/2015 Elsevier Interactive Patient Education  2017 [employer] Prevention in the Home Falls can cause injuries. They can happen to people of all ages. There are many things you can do to make your home safe and to help prevent falls. What can I do on the outside of my home? Regularly fix the edges of walkways and driveways and fix any cracks. Remove anything that might make you trip as you walk through a door, such as a raised step or threshold. Trim any bushes or trees on the path to your home. Use bright outdoor lighting. Clear any walking paths of anything that might make someone trip, such as rocks or tools. Regularly check to see if handrails are loose or broken. Make sure that both sides of any steps have handrails. Any raised decks and porches should have guardrails on the edges. Have any leaves, snow, or ice cleared regularly. Use sand or salt on walking paths during winter. Clean up any spills in your garage right away. This includes oil or grease spills. What can I do in the bathroom? Use night lights. Install grab bars by the toilet and in the tub and shower. Do not use towel bars as grab bars. Use non-skid mats or decals in the tub or shower. If you need to sit down in the shower, use a plastic, non-slip stool. Keep the floor dry. Clean up any water that spills on the floor as soon as it  happens. Remove soap buildup in the tub or shower regularly. Attach bath mats securely with double-sided non-slip rug tape. Do not have throw rugs and other things on the floor that can make you trip. What can I do in the bedroom? Use night lights. Make sure that you have a light by your bed that is easy to reach. Do not use any sheets or blankets that are too big for your bed. They should not hang down onto the floor. Have a firm chair that has side arms. You can use this for support while you get dressed. Do not have throw rugs and other things on the floor that can make you trip. What can I do in the kitchen? Clean up any spills right away. Avoid walking on wet floors. Keep items that you use a lot in easy-to-reach places. If you need to reach something above you, use a strong step stool that has a grab bar. Keep electrical cords out of the way. Do not use floor polish or wax that makes floors slippery. If you must use wax, use non-skid floor wax. Do not have throw rugs and other things on the floor that  can make you trip. What can I do with my stairs? Do not leave any items on the stairs. Make sure that there are handrails on both sides of the stairs and use them. Fix handrails that are broken or loose. Make sure that handrails are as long as the stairways. Check any carpeting to make sure that it is firmly attached to the stairs. Fix any carpet that is loose or worn. Avoid having throw rugs at the top or bottom of the stairs. If you do have throw rugs, attach them to the floor with carpet tape. Make sure that you have a light switch at the top of the stairs and the bottom of the stairs. If you do not have them, ask someone to add them for you. What else can I do to help prevent falls? Wear shoes that: Do not have high heels. Have rubber bottoms. Are comfortable and fit you well. Are closed at the toe. Do not wear sandals. If you use a stepladder: Make sure that it is fully opened.  Do not climb a closed stepladder. Make sure that both sides of the stepladder are locked into place. Ask someone to hold it for you, if possible. Clearly mark and make sure that you can see: Any grab bars or handrails. First and last steps. Where the edge of each step is. Use tools that help you move around (mobility aids) if they are needed. These include: Canes. Walkers. Scooters. Crutches. Turn on the lights when you go into a dark area. Replace any light bulbs as soon as they burn out. Set up your furniture so you have a clear path. Avoid moving your furniture around. If any of your floors are uneven, fix them. If there are any pets around you, be aware of where they are. Review your medicines with your doctor. Some medicines can make you feel dizzy. This can increase your chance of falling. Ask your doctor what other things that you can do to help prevent falls. This information is not intended to replace advice given to you by your health care provider. Make sure you discuss any questions you have with your health care provider. Document Released: 02/22/2009 Document Revised: 10/04/2015 Document Reviewed: 06/02/2014 Elsevier Interactive Patient Education  2017 [employer].

## 2022-08-21 NOTE — Progress Notes (Signed)
Subjective:   [name] is a 82 y.o. female who presents for Medicare Annual (Subsequent) preventive examination.  Review of Systems    No ROS.  Medicare Wellness Virtual Visit.  Visual/audio telehealth visit, UTA vital signs.   See social history for additional risk factors.   Cardiac Risk Factors include: advanced age (>65men, >69 women)     Objective:    Today's Vitals   08/21/22 1402  Weight: 149 lb (67.6 kg)  Height: 5\' 2"  (1.575 m)   Body mass index is 27.25 kg/m.     08/21/2022    2:11 PM 07/01/2021    2:54 PM 06/28/2020    1:47 PM 04/06/2019    9:08 AM 06/23/2018    6:42 AM  Advanced Directives  Does Patient Have a Medical Advance Directive? Yes Yes Yes Yes Yes  Type of Estate agent of [place];Living will Healthcare Power of [place];Living will Healthcare Power of [place];Living will Healthcare Power of [place];Living will Healthcare Power of [place];Living will  Does patient want to make changes to medical advance directive? No - Patient declined No - Patient declined No - Patient declined No - Patient declined No - Patient declined  Copy of Healthcare Power of Attorney in Chart? Yes - validated most recent copy scanned in chart (See row information) Yes - validated most recent copy scanned in chart (See row information) Yes - validated most recent copy scanned in chart (See row information) Yes - validated most recent copy scanned in chart (See row information) No - copy requested    Current Medications (verified) Outpatient Encounter Medications as of 08/21/2022  Medication Sig   Acetaminophen (TYLENOL ARTHRITIS PAIN PO) Take 1 tablet by mouth daily.   acetaminophen (TYLENOL) 500 MG tablet Take 500 mg by mouth daily.    atorvastatin (LIPITOR) 40 MG tablet TAKE 1 TABLET BY MOUTH AT  BEDTIME   Cholecalciferol (VITAMIN D-3) 25 MCG (1000 UT) CAPS Take by mouth.   diltiazem (CARDIZEM CD) 120 MG 24 hr capsule Take 1 capsule (120 mg total)  by mouth daily.   ELIQUIS 5 MG TABS tablet TAKE 1 TABLET BY MOUTH TWICE  DAILY   enalapril (VASOTEC) 5 MG tablet TAKE 1 TABLET BY MOUTH DAILY   fluorouracil (EFUDEX) 5 % cream as needed (skin cancer).   metoprolol succinate (TOPROL-XL) 100 MG 24 hr tablet TAKE 1 TABLET BY MOUTH DAILY  WITH OR IMMEDIATELY FOLLOWING A  MEAL   Multiple Vitamin (MULTIVITAMIN WITH MINERALS) TABS tablet Take 1 tablet by mouth daily.   Omega-3 Fatty Acids (FISH OIL) 1200 MG CAPS Take 1,200 mg by mouth daily.   triamcinolone cream (KENALOG) 0.1 % Apply 1 application topically daily as needed (skin cancer).   No facility-administered encounter medications on file as of 08/21/2022.    Allergies (verified) Patient has no known allergies.   History: Past Medical History:  Diagnosis Date   Arthritis    hands, knees, ankles   BCC (basal cell carcinoma of skin) 2016   Stinehelfer   Bilateral carotid artery stenosis    a. s/p right carotid stent after restenosis post CEA, left carotid stent (previously on Plavix for these). b. Carotid US 03/2016: stable 40-59% BICA.   Fibrocystic breast disease    followed by surgery Evette Cristal)   GERD (gastroesophageal reflux disease)    OTC prilosec   Hernia 2005   History of chicken pox    History of colon polyps    History of melanoma 1990s  right shoulder   HLD (hyperlipidemia)    HTN (hypertension) 1970s   Hypertensive retinopathy of both eyes, grade 1 Sep 19, 2015   Bulakowski   Kidney cyst, acquired 09/19/14   large R septated 8cm, stable Sep 19, 2015   Melanoma in situ Sep 19, 2015   R medial posterior arm - lentio maligna type, margin involved (Stinehelfer)   PAF (paroxysmal atrial fibrillation)    a. dx 12/2015 at routine physical.   Past Surgical History:  Procedure Laterality Date   ABDOMINAL AORTOGRAM W/LOWER EXTREMITY N/A 06/23/2018   Procedure: ABDOMINAL AORTOGRAM W/LOWER EXTREMITY;  Surgeon: Iran Ouch, MD;  Location: [facility];  Service: Cardiovascular;  Laterality:  N/A;   ABI  2005-09-18   R 1.19, L 1.2   AORTIC ARCH ANGIOGRAPHY N/A 06/23/2018   Procedure: AORTIC ARCH ANGIOGRAPHY;  Surgeon: Iran Ouch, MD;  Location: [facility];  Service: Cardiovascular;  Laterality: N/A;   APPENDECTOMY  1995   BREAST BIOPSY Left 1997   left   BREAST BIOPSY Right 08/22/2020   [place] Bx, Q-clip, neg/ discordant   BREAST BIOPSY Right 09/05/2020   stereo biopsy/ ribbon clip/ path pending   BREAST CYST ASPIRATION Left 02/1997   fna   BREAST EXCISIONAL BIOPSY Right 1998   benign per pt   CARDIOVASCULAR STRESS TEST  09/18/2001   WNL   CAROTID ENDARTERECTOMY  09/18/2000   bilateral   CAROTID STENT Right 18-Sep-2000   right after restenosis post CEA (Dr. Jodie Echevaria)   CAROTID STENT  Sep 18, 2012   for stenosis   CAROTID STENT Left 2014   Dr. Burnetta Sabin, [facility]   CESAREAN SECTION  1966/09/19; 1972   COLONOSCOPY  03/2012   Evette Cristal - polyp (rec rpt 5 yrs)   EXPLORATORY LAPAROTOMY  1990s   for GI pain, inflamed lower GI, unrevealing workup, improved with abx   HERNIA REPAIR  2005   abdomen   MELANOMA EXCISION  1998   right shoulder   TOTAL HIP ARTHROPLASTY  Sep 19, 2003   bilat   Family History  Problem Relation Age of Onset   Cancer Mother 34       lung, smoker   Cancer Father 75       prostate   Cancer Brother    Hypertension Paternal Grandfather    CAD Neg Hx    Stroke Neg Hx    Diabetes Neg Hx    Heart attack Neg Hx    Breast cancer Neg Hx    Social History   Socioeconomic History   Marital status: Widowed    Spouse name: Not on file   Number of children: Not on file   Years of education: Not on file   Highest education level: Not on file  Occupational History   Not on file  Tobacco Use   Smoking status: Former    Packs/day: 1.00    Years: 30.00    Additional pack years: 0.00    Total pack years: 30.00    Types: Cigarettes   Smokeless tobacco: Never  Vaping Use   Vaping Use: Never used  Substance and Sexual Activity   Alcohol use: Yes    Alcohol/week: 0.0 standard drinks of  alcohol    Comment: 1 glass wine daily   Drug use: No   Sexual activity: Not on file  Other Topics Concern   Not on file  Social History Narrative   Caffeine: 3 cups coffee/day   Widow of husband Windy Fast) died in 2013-09-18 cancer, lives with 1  dog.  Grown children   Occupation: homemaker, used to be Financial controller   Edu: college   Activity: plays golf, walks dog, swims   Diet: good water, fruits/vegetables daily   Former smoker age 78 to 1958/9 1 pk lasted 1 week    Social Determinants of Health   Financial Resource Strain: Low Risk  (08/21/2022)   Overall Financial Resource Strain (CARDIA)    Difficulty of Paying Living Expenses: Not hard at all  Food Insecurity: No Food Insecurity (08/21/2022)   Hunger Vital Sign    Worried About Running Out of Food in the Last Year: Never true    Ran Out of Food in the Last Year: Never true  Transportation Needs: No Transportation Needs (08/21/2022)   PRAPARE - Administrator, Civil Service (Medical): No    Lack of Transportation (Non-Medical): No  Physical Activity: Sufficiently Active (08/21/2022)   Exercise Vital Sign    Days of Exercise per Week: 5 days    Minutes of Exercise per Session: 30 min  Stress: No Stress Concern Present (08/21/2022)   [employer] of Occupational Health - Occupational Stress Questionnaire    Feeling of Stress : Not at all  Social Connections: Moderately Integrated (08/21/2022)   Social Connection and Isolation Panel [NHANES]    Frequency of Communication with Friends and Family: More than three times a week    Frequency of Social Gatherings with Friends and Family: More than three times a week    Attends Religious Services: More than 4 times per year    Active Member of [employer] or Organizations: Yes    Attends Banker Meetings: More than 4 times per year    Marital Status: Widowed    Tobacco Counseling Counseling given: Not Answered   Clinical Intake:  Pre-visit preparation  completed: Yes        Diabetes: No  How often do you need to have someone help you when you read instructions, pamphlets, or other written materials from your doctor or pharmacy?: 1 - Never   Interpreter Needed?: No      Activities of Daily Living    08/21/2022    2:04 PM  In your present state of health, do you have any difficulty performing the following activities:  Hearing? 0  Vision? 1  Difficulty concentrating or making decisions? 0  Walking or climbing stairs? 0  Dressing or bathing? 0  Doing errands, shopping? 1  Preparing Food and eating ? N  Using the Toilet? N  In the past six months, have you accidently leaked urine? N  Do you have problems with loss of bowel control? N  Managing your Medications? N  Managing your Finances? N  Housekeeping or managing your Housekeeping? N    Patient Care Team: Bethanie Dicker, NP as PCP - General (Nurse Practitioner) Corky Crafts, MD as PCP - Cardiology (Cardiology) Kieth Brightly, MD (General Surgery) Lorie Phenix, MD as Referring Physician (Family Medicine)  Indicate any recent Medical Services you may have received from other than [facility] providers in the past year (date may be approximate).     Assessment:   This is a routine wellness examination for [place].  I connected with  [name] on 08/21/22 by a audio enabled telemedicine application and verified that I am speaking with the correct person using two identifiers.  Patient Location: Home  Provider Location: Office/Clinic  I discussed the limitations of evaluation and management by telemedicine. The patient expressed understanding  and agreed to proceed.   Hearing/Vision screen Hearing Screening - Comments:: Patient is able to hear conversational tones without difficulty.  No issues reported.   Vision Screening - Comments:: Followed by [facility] Wears corrective lenses Macular degeneration; injections They have seen their  ophthalmologist in the last 12 months.    Dietary issues and exercise activities discussed: Current Exercise Habits: Home exercise routine, Type of exercise: walking, Time (Minutes): 45, Frequency (Times/Week): 5, Weekly Exercise (Minutes/Week): 225, Intensity: Mild   Goals Addressed             This Visit's Progress    Follow up with Primary Care Provider       As needed       Depression Screen    08/21/2022    2:08 PM 05/13/2022    9:03 AM 07/01/2021    3:04 PM 06/28/2020    1:36 PM 07/06/2019    8:42 AM 04/06/2019    9:10 AM 12/30/2018    8:26 AM  PHQ 2/9 Scores  PHQ - 2 Score 0 0 0 0 0 0 0  PHQ- 9 Score  0         Fall Risk    08/21/2022    2:04 PM 05/13/2022    9:02 AM 07/01/2021    3:03 PM 06/28/2020    1:38 PM 07/06/2019    8:42 AM  Fall Risk   Falls in the past year? 0 0 0 0 0  Number falls in past yr: 0 0 0 0 0  Injury with Fall? 0 0  0 0  Risk for fall due to :  No Fall Risks     Follow up Falls evaluation completed;Falls prevention discussed Falls evaluation completed Falls evaluation completed Falls evaluation completed Falls evaluation completed    FALL RISK PREVENTION PERTAINING TO THE HOME: Home free of loose throw rugs in walkways, pet beds, electrical cords, etc? Yes  Adequate lighting in your home to reduce risk of falls? Yes   ASSISTIVE DEVICES UTILIZED TO PREVENT FALLS: Life alert? No  Use of a cane, walker or w/c? No  Grab bars in the bathroom? No  Shower chair or bench in shower? No  Elevated toilet seat or a handicapped toilet? No   TIMED UP AND GO: Was the test performed? No .    Cognitive Function:        08/21/2022    2:11 PM 07/01/2021    3:07 PM 06/28/2020    1:38 PM 04/06/2019    9:13 AM  6CIT Screen  What Year? 0 points 0 points 0 points 0 points  What month? 0 points 0 points 0 points 0 points  What time? 0 points 0 points 0 points 0 points  Count back from 20 0 points 0 points  0 points  Months in reverse 0 points 0 points  0 points 0 points  Repeat phrase 0 points 0 points  0 points  Total Score 0 points 0 points  0 points    Immunizations Immunization History  Administered Date(s) Administered   Fluad Quad(high Dose 65+) 01/14/2019   Influenza Whole 01/10/2013   Influenza, High Dose Seasonal PF 01/24/2015, 02/18/2016, 02/09/2017, 02/02/2018   Influenza, Seasonal, Injecte, Preservative Fre 07/11/2014   Influenza-Unspecified 02/03/2020   PFIZER(Purple Top)SARS-COV-2 Vaccination 06/16/2019, 07/11/2019, 04/28/2020   Pneumococcal Conjugate-13 11/03/2014   Pneumococcal Polysaccharide-23 10/13/2012, 02/02/2018   Td 10/13/2012   Zoster Recombinat (Shingrix) 12/23/2019, 05/24/2020   Zoster, Live 10/23/2012   Screening Tests  Health Maintenance  Topic Date Due   COVID-19 Vaccine (4 - 2023-24 season) 09/06/2022 (Originally 01/10/2022)   DTaP/Tdap/Td (2 - Tdap) 10/14/2022   INFLUENZA VACCINE  12/11/2022   Medicare Annual Wellness (AWV)  08/21/2023   Pneumonia Vaccine 67+ Years old  Completed   DEXA SCAN  Completed   Zoster Vaccines- Shingrix  Completed   HPV VACCINES  Aged Out    Health Maintenance There are no preventive care reminders to display for this patient.  Lung Cancer Screening: (Low Dose CT Chest recommended if Age 78-80 years, 30 pack-year currently smoking OR have quit w/in 15years.) does not qualify.   Hepatitis C Screening: does not qualify.  Vision Screening: Recommended annual ophthalmology exams for early detection of glaucoma and other disorders of the eye.  Dental Screening: Recommended annual dental exams for proper oral hygiene  Community Resource Referral / Chronic Care Management: CRR required this visit?  No   CCM required this visit?  No      Plan:     I have personally reviewed and noted the following in the patient's chart:   Medical and social history Use of alcohol, tobacco or illicit drugs  Current medications and supplements including opioid prescriptions.  Patient is not currently taking opioid prescriptions. Functional ability and status Nutritional status Physical activity Advanced directives List of other physicians Hospitalizations, surgeries, and ER visits in previous 12 months Vitals Screenings to include cognitive, depression, and falls Referrals and appointments  In addition, I have reviewed and discussed with patient certain preventive protocols, quality metrics, and best practice recommendations. A written personalized care plan for preventive services as well as general preventive health recommendations were provided to patient.     Cathey Endow, LPN   08/18/8117

## 2022-09-22 DIAGNOSIS — H2513 Age-related nuclear cataract, bilateral: Secondary | ICD-10-CM | POA: Diagnosis not present

## 2022-09-22 DIAGNOSIS — E119 Type 2 diabetes mellitus without complications: Secondary | ICD-10-CM | POA: Diagnosis not present

## 2022-09-22 DIAGNOSIS — H35042 Retinal micro-aneurysms, unspecified, left eye: Secondary | ICD-10-CM | POA: Diagnosis not present

## 2022-09-27 ENCOUNTER — Other Ambulatory Visit: Payer: Self-pay | Admitting: Cardiovascular Disease

## 2022-09-29 NOTE — Telephone Encounter (Signed)
Refill request

## 2022-09-29 NOTE — Telephone Encounter (Signed)
Prescription refill request for Eliquis received.  Indication: pad Last office visit: [place], 04/11/2022 Scr: 0.98, 05/13/2022 Age: 82 yo  Weight: 67.6 kg   Refill sent.

## 2022-10-11 ENCOUNTER — Other Ambulatory Visit: Payer: Self-pay | Admitting: Interventional Cardiology

## 2022-10-23 ENCOUNTER — Ambulatory Visit (HOSPITAL_COMMUNITY)
Admission: RE | Admit: 2022-10-23 | Discharge: 2022-10-23 | Disposition: A | Discharge status: Home / Self Care | Payer: Medicare Other | Source: Ambulatory Visit | Attending: Internal Medicine | Admitting: Internal Medicine

## 2022-10-23 DIAGNOSIS — I779 Disorder of arteries and arterioles, unspecified: Secondary | ICD-10-CM | POA: Diagnosis not present

## 2022-10-23 DIAGNOSIS — I6523 Occlusion and stenosis of bilateral carotid arteries: Secondary | ICD-10-CM | POA: Insufficient documentation

## 2022-10-28 ENCOUNTER — Other Ambulatory Visit: Payer: Self-pay | Admitting: Interventional Cardiology

## 2022-10-28 ENCOUNTER — Other Ambulatory Visit: Payer: Self-pay | Admitting: *Deleted

## 2022-10-28 DIAGNOSIS — I779 Disorder of arteries and arterioles, unspecified: Secondary | ICD-10-CM

## 2022-10-28 DIAGNOSIS — I771 Stricture of artery: Secondary | ICD-10-CM

## 2022-10-30 NOTE — Telephone Encounter (Signed)
This is a [place] pt 

## 2022-11-11 ENCOUNTER — Encounter: Payer: Self-pay | Admitting: Nurse Practitioner

## 2022-11-11 ENCOUNTER — Ambulatory Visit (INDEPENDENT_AMBULATORY_CARE_PROVIDER_SITE_OTHER): Payer: Medicare Other | Admitting: Nurse Practitioner

## 2022-11-11 VITALS — BP 116/60 | HR 75 | Temp 98.0°F | Ht 62.0 in | Wt 147.0 lb

## 2022-11-11 DIAGNOSIS — I4891 Unspecified atrial fibrillation: Secondary | ICD-10-CM | POA: Diagnosis not present

## 2022-11-11 DIAGNOSIS — I10 Essential (primary) hypertension: Secondary | ICD-10-CM | POA: Diagnosis not present

## 2022-11-11 DIAGNOSIS — E785 Hyperlipidemia, unspecified: Secondary | ICD-10-CM

## 2022-11-11 DIAGNOSIS — N1831 Chronic kidney disease, stage 3a: Secondary | ICD-10-CM | POA: Diagnosis not present

## 2022-11-11 DIAGNOSIS — R7303 Prediabetes: Secondary | ICD-10-CM | POA: Diagnosis not present

## 2022-11-11 LAB — CBC WITH DIFFERENTIAL/PLATELET
Basophils Absolute: 0.1 10*3/uL (ref 0.0–0.1)
Basophils Relative: 1.2 % (ref 0.0–3.0)
Eosinophils Absolute: 0.1 10*3/uL (ref 0.0–0.7)
Eosinophils Relative: 1.7 % (ref 0.0–5.0)
HCT: 46 % (ref 36.0–46.0)
Hemoglobin: 15.1 g/dL — ABNORMAL HIGH (ref 12.0–15.0)
Lymphocytes Relative: 25.4 % (ref 12.0–46.0)
Lymphs Abs: 1.2 10*3/uL (ref 0.7–4.0)
MCHC: 32.9 g/dL (ref 30.0–36.0)
MCV: 100.3 fl — ABNORMAL HIGH (ref 78.0–100.0)
Monocytes Absolute: 0.6 10*3/uL (ref 0.1–1.0)
Monocytes Relative: 11.8 % (ref 3.0–12.0)
Neutro Abs: 2.9 10*3/uL (ref 1.4–7.7)
Neutrophils Relative %: 59.9 % (ref 43.0–77.0)
Platelets: 169 10*3/uL (ref 150.0–400.0)
RBC: 4.58 Mil/uL (ref 3.87–5.11)
RDW: 15.8 % — ABNORMAL HIGH (ref 11.5–15.5)
WBC: 4.8 10*3/uL (ref 4.0–10.5)

## 2022-11-11 LAB — COMPREHENSIVE METABOLIC PANEL
ALT: 40 U/L — ABNORMAL HIGH (ref 0–35)
AST: 27 U/L (ref 0–37)
Albumin: 4.2 g/dL (ref 3.5–5.2)
Alkaline Phosphatase: 122 U/L — ABNORMAL HIGH (ref 39–117)
BUN: 18 mg/dL (ref 6–23)
CO2: 28 mEq/L (ref 19–32)
Calcium: 10 mg/dL (ref 8.4–10.5)
Chloride: 100 mEq/L (ref 96–112)
Creatinine, Ser: 0.97 mg/dL (ref 0.40–1.20)
GFR: 53.84 mL/min — ABNORMAL LOW (ref >60.00)
Glucose, Bld: 106 mg/dL — ABNORMAL HIGH (ref 70–99)
Potassium: 4.2 mEq/L (ref 3.5–5.1)
Sodium: 135 mEq/L (ref 135–145)
Total Bilirubin: 1.3 mg/dL — ABNORMAL HIGH (ref 0.2–1.2)
Total Protein: 8.5 g/dL — ABNORMAL HIGH (ref 6.0–8.3)

## 2022-11-11 LAB — LIPID PANEL
Cholesterol: 116 mg/dL (ref 0–200)
HDL: 39.6 mg/dL (ref >39.00)
LDL Cholesterol: 62 mg/dL (ref 0–99)
NonHDL: 75.94
Total CHOL/HDL Ratio: 3
Triglycerides: 69 mg/dL (ref 0.0–149.0)
VLDL: 13.8 mg/dL (ref 0.0–40.0)

## 2022-11-11 LAB — HEMOGLOBIN A1C: Hgb A1c MFr Bld: 5.9 % (ref 4.6–6.5)

## 2022-11-11 NOTE — Progress Notes (Signed)
Rebecca Dicker, NP-C Phone: [phone]  [name] is a 82 y.o. female who presents today for follow up. She has no complaints or new concerns today. She is doing well on all of her medications.   Permanent A-Fib: Currently on Eliquis 5 mg BID, Diltiazem and Toprol XL. Rate controlled. Asymptomatic. Denies any new palpitations. Denies any problems with bleeding.   HYPERTENSION Disease Monitoring Home BP Monitoring- 120s/80s Chest pain- No    Dyspnea- No Medications Compliance-  Toprol XL, Enalapril and Diltiazem. Lightheadedness-  No  Edema- No BMET    Component Value Date/Time   NA 135 11/11/2022 0813   NA 139 11/18/2021 1054   K 4.2 11/11/2022 0813   K 3.9 06/26/2011 0906   CL 100 11/11/2022 0813   CO2 28 11/11/2022 0813   GLUCOSE 106 (H) 11/11/2022 0813   BUN 18 11/11/2022 0813   BUN 18 11/18/2021 1054   CREATININE 0.97 11/11/2022 0813   CREATININE 1.12 (H) 02/16/2018 0924   CREATININE 1.12 (H) 02/16/2018 0924   CALCIUM 10.0 11/11/2022 0813   GFRNONAA 51 (L) 06/15/2018 0000   GFRNONAA 47 (L) 02/16/2018 0924   GFRAA 59 (L) 06/15/2018 0000   GFRAA 54 (L) 02/16/2018 0924   HYPERLIPIDEMIA Symptoms Chest pain on exertion:  No   Leg claudication:   No Medications: Compliance- Lipitor and Fish Oil Right upper quadrant pain- No  Muscle aches- No Lipid Panel     Component Value Date/Time   CHOL 116 11/11/2022 0813   CHOL 126 11/18/2021 1054   TRIG 69.0 11/11/2022 0813   TRIG 144 03/28/2011 0000   HDL 39.60 11/11/2022 0813   HDL 38 (L) 11/18/2021 1054   CHOLHDL 3 11/11/2022 0813   VLDL 13.8 11/11/2022 0813   LDLCALC 62 11/11/2022 0813   LDLCALC 68 11/18/2021 1054   LDLCALC 76 02/16/2018 0924   LDLDIRECT 78 03/28/2011 0000   LABVLDL 20 11/18/2021 1054    Social History   Tobacco Use  Smoking Status Former   Packs/day: 1.00   Years: 30.00   Additional pack years: 0.00   Total pack years: 30.00   Types: Cigarettes  Smokeless Tobacco Never    Current  Outpatient Medications on File Prior to Visit  Medication Sig Dispense Refill   Acetaminophen (TYLENOL ARTHRITIS PAIN PO) Take 1 tablet by mouth daily.     acetaminophen (TYLENOL) 500 MG tablet Take 500 mg by mouth daily.      apixaban (ELIQUIS) 5 MG TABS tablet TAKE 1 TABLET BY MOUTH TWICE  DAILY 200 tablet 1   atorvastatin (LIPITOR) 40 MG tablet TAKE 1 TABLET BY MOUTH AT  BEDTIME 100 tablet 2   Cholecalciferol (VITAMIN D-3) 25 MCG (1000 UT) CAPS Take by mouth.     diltiazem (CARDIZEM CD) 120 MG 24 hr capsule Take 1 capsule (120 mg total) by mouth daily. 100 capsule 2   enalapril (VASOTEC) 5 MG tablet TAKE 1 TABLET BY MOUTH DAILY 100 tablet 2   fluorouracil (EFUDEX) 5 % cream as needed (skin cancer).     metoprolol succinate (TOPROL-XL) 100 MG 24 hr tablet TAKE 1 TABLET BY MOUTH DAILY  WITH OR IMMEDIATELY FOLLOWING A  MEAL 90 tablet 0   Multiple Vitamin (MULTIVITAMIN WITH MINERALS) TABS tablet Take 1 tablet by mouth daily.     Omega-3 Fatty Acids (FISH OIL) 1200 MG CAPS Take 1,200 mg by mouth daily.     triamcinolone cream (KENALOG) 0.1 % Apply 1 application topically daily as needed (skin cancer).  No current facility-administered medications on file prior to visit.     ROS see history of present illness  Objective  Physical Exam Vitals:   11/11/22 0800  BP: 116/60  Pulse: 75  Temp: 98 F (36.7 C)  SpO2: 95%    BP Readings from Last 3 Encounters:  11/11/22 116/60  05/13/22 114/60  04/11/22 127/85   Wt Readings from Last 3 Encounters:  11/11/22 147 lb (66.7 kg)  08/21/22 149 lb (67.6 kg)  05/13/22 149 lb 12.8 oz (67.9 kg)    Physical Exam Constitutional:      General: She is not in acute distress.    Appearance: Normal appearance.  HENT:     Head: Normocephalic.  Cardiovascular:     Rate and Rhythm: Normal rate. Rhythm irregularly irregular.     Heart sounds: Normal heart sounds.  Pulmonary:     Effort: Pulmonary effort is normal.     Breath sounds: Normal  breath sounds.  Skin:    General: Skin is warm and dry.  Neurological:     General: No focal deficit present.     Mental Status: She is alert.  Psychiatric:        Mood and Affect: Mood normal.        Behavior: Behavior normal.     Assessment/Plan: Please see individual problem list.  Atrial fibrillation, unspecified type [facility]) Assessment & Plan: Chronic. Stable on Eliquis, Diltiazem and Toprol XL. Continue. Permanent with rate control. Asymptomatic. Follow up with Cardiology as scheduled.    Hypertension, unspecified type Assessment & Plan: Chronic. Stable on Enalapril, Toprol XL and Diltiazem. Continue.   Orders: -     CBC with Differential/Platelet  Hyperlipidemia, unspecified hyperlipidemia type Assessment & Plan: Chronic. Stable on Lipitor. Patient also taking Fish Oil. Continue. Will check lipids today.  Orders: -     Lipid panel  Prediabetes Assessment & Plan: Chronic. Stable. Last A1c- 6.0, will recheck today.   Orders: -     Hemoglobin A1c  Stage 3a chronic kidney disease (HCC) Assessment & Plan: Chronic. Stable. Last GFR- 53. Will check CMP today. Encouraged adequate fluid intake.   Orders: -     Comprehensive metabolic panel   Return in about 6 months (around 05/14/2023) for Follow up.   Rebecca Dicker, NP-C [facility] [employer]

## 2022-11-18 ENCOUNTER — Telehealth: Payer: Self-pay | Admitting: Nurse Practitioner

## 2022-11-18 NOTE — Assessment & Plan Note (Signed)
Chronic. Stable on Lipitor. Patient also taking Fish Oil. Continue. Will check lipids today.

## 2022-11-18 NOTE — Telephone Encounter (Signed)
Called and read pcp mychart note on lab results, pt stated her computer is in the shop it crashed so she was uanble to look at [employer]. Pt does not have any questions at this time and has verbalized understanding

## 2022-11-18 NOTE — Telephone Encounter (Signed)
Pt would like to be called regarding her lab results 

## 2022-11-18 NOTE — Assessment & Plan Note (Signed)
Chronic. Stable. Last A1c- 6.0, will recheck today.

## 2022-11-18 NOTE — Assessment & Plan Note (Signed)
Chronic. Stable on Eliquis, Diltiazem and Toprol XL. Continue. Permanent with rate control. Asymptomatic. Follow up with Cardiology as scheduled.

## 2022-11-18 NOTE — Assessment & Plan Note (Signed)
Chronic. Stable on Enalapril, Toprol XL and Diltiazem. Continue.

## 2022-11-18 NOTE — Assessment & Plan Note (Signed)
Chronic. Stable. Last GFR- 53. Will check CMP today. Encouraged adequate fluid intake.

## 2022-12-27 ENCOUNTER — Other Ambulatory Visit: Payer: Self-pay | Admitting: Cardiology

## 2022-12-30 ENCOUNTER — Encounter: Payer: Self-pay | Admitting: Nurse Practitioner

## 2022-12-30 ENCOUNTER — Ambulatory Visit: Payer: Medicare Other | Attending: Nurse Practitioner | Admitting: Nurse Practitioner

## 2022-12-30 VITALS — BP 120/72 | HR 75 | Ht 62.0 in | Wt 147.6 lb

## 2022-12-30 DIAGNOSIS — I6523 Occlusion and stenosis of bilateral carotid arteries: Secondary | ICD-10-CM

## 2022-12-30 DIAGNOSIS — Z7901 Long term (current) use of anticoagulants: Secondary | ICD-10-CM

## 2022-12-30 DIAGNOSIS — I739 Peripheral vascular disease, unspecified: Secondary | ICD-10-CM

## 2022-12-30 DIAGNOSIS — Z95828 Presence of other vascular implants and grafts: Secondary | ICD-10-CM | POA: Diagnosis not present

## 2022-12-30 DIAGNOSIS — E785 Hyperlipidemia, unspecified: Secondary | ICD-10-CM

## 2022-12-30 DIAGNOSIS — I4821 Permanent atrial fibrillation: Secondary | ICD-10-CM | POA: Diagnosis not present

## 2022-12-30 DIAGNOSIS — I10 Essential (primary) hypertension: Secondary | ICD-10-CM

## 2022-12-30 NOTE — Patient Instructions (Signed)
Medication Instructions:   Your physician recommends that you continue on your current medications as directed. Please refer to the Current Medication list given to you today.   *If you need a refill on your cardiac medications before your next appointment, please call your pharmacy*   Lab Work:  None ordered.  If you have labs (blood work) drawn today and your tests are completely normal, you will receive your results only by: MyChart Message (if you have MyChart) OR A paper copy in the mail If you have any lab test that is abnormal or we need to change your treatment, we will call you to review the results.   Testing/Procedures:  None ordered.   Follow-Up: At [facility], you and your health needs are our priority.  As part of our continuing mission to provide you with exceptional heart care, we have created designated Provider Care Teams.  These Care Teams include your primary Cardiologist (physician) and Advanced Practice Providers (APPs -  Physician Assistants and Nurse Practitioners) who all work together to provide you with the care you need, when you need it.  We recommend signing up for the patient portal called "MyChart".  Sign up information is provided on this After Visit Summary.  MyChart is used to connect with patients for Virtual Visits (Telemedicine).  Patients are able to view lab/test results, encounter notes, upcoming appointments, etc.  Non-urgent messages can be sent to your provider as well.   To learn more about what you can do with MyChart, go to [email or web address].    Your next appointment:   6 month(s)  Provider:   Lorine Bears, MD    Other Instructions  Your physician wants you to follow-up in: 6 months with Dr. Kirke Corin in [place].  You will receive a reminder letter in the mail two months in advance. If you don't receive a letter, please call our office to schedule the follow-up appointment.

## 2022-12-30 NOTE — Progress Notes (Signed)
Cardiology Office Note:  .   Date:  12/30/2022  ID:  [name], DOB [birth year], MRN [id number] PCP: Bethanie Dicker, NP  [facility] Providers Cardiologist:  Lance Muss, MD    Patient Profile: .      PMH PAD Permanent atrial fibrillation CHA2DS2-VASc score of 4 OAC apixaban Hyperlipidemia Hypertension Subclavian stenosis Moderate subclavian disease on carotid duplex 10/23/2022 Bilateral carotid artery stenosis S/p right carotid stent after restenosis post CEA Patent carotid stents on carotid duplex 10/23/2022  History of stress test which was negative many years ago.  Previously followed by Dr. Jodie Echevaria at [facility] now at [facility]. She established cardiac care with Dr. Eldridge Dace in 01/29/13. Her husband passed away in Jan 29, 2014.   Extensive history of vascular disease followed in the past by Dr. Manuella Ghazi locally for her carotid disease (was remotely followed by [facility]).  She had right carotid stent after restenosis post CEA.  Left carotid stenting was done January 2014.  She was seen in 2018/01/29 for bilateral calf pain as well as arm discomfort with exertion.  She had noninvasive vascular evaluation which showed an ABI of 0.88 on the right and 0.91 on the left but it was difficult to interpret given the pressures in the arms was abnormal.  Duplex of the upper extremities showed severe subclavian disease bilaterally worse on the left side.  Aortic arch and lower extremity angiography was performed in February 2020.  There was moderate right subclavian artery stenosis at the origin of the RIMA.  There was significant stenosis affecting the left proximal axillary artery.  The right lower extremity had significant calcified common femoral artery disease extending into the ostium of the SFA that occluded the popliteal artery but there was two-vessel runoff below the knee.  The left had borderline significant common iliac artery stenosis with diffuse moderate disease the entire length of the left SFA  with severe focal stenosis with three-vessel runoff below the knee.  Diagnosed with atrial fibrillation in 2016/01/30.  She was asymptomatic at the time.  Seen by Dr. Kirke Corin 11/27/2020 reporting worsening claudication symptoms but it did not impact her ADLs.  Her ABIs were repeated and considered stable.  She was encouraged to continue with her exercise program and current medical therapy.  Last cardiology clinic visit was 04/11/2022 with Charlsie Quest, NP at which time she reported able to play golf and pickleball with no significant symptoms.  She was having visual changes and only able to see out of one eye.  According to her ophthalmologist there was a blood vessel that burst in the back of her eye. She had no significant cardiac symptoms and 1 year f/u was recommended.         History of Present Illness: .   [name] is a very pleasant 82 y.o. female who is here today for follow-up. She remains active around home walking her dog 3 times per day, walks 1 mile 3-4 times per day, does yard and house work, and occasional golf (its been too hot).  Feels that her legs have gotten stronger and has less claudication with more activity.  Had to quit playing pickleball. Remains socially active as well, "I don't want to be a couch potato." She belongs to 3 different card groups. No bleeding concerns. She denies chest pain, shortness of breath, lower extremity edema, fatigue, palpitations, melena, presyncope, syncope, orthopnea, and PND. Tries to be careful to avoid sodium and sugar in diet. Is bothered by lack of vision in  left eye secondary to macular degeneration. Has to be careful to avoid falls.   ROS: See HPI       Studies Reviewed: .         Risk Assessment/Calculations:    CHA2DS2-VASc Score = 4   This indicates a 4.8% annual risk of stroke. The patient's score is based upon: CHF History: 0 HTN History: 1 Diabetes History: 0 Stroke History: 0 Vascular Disease History: 0 Age Score:  2 Gender Score: 1            Physical Exam:   VS:  BP 120/72   Pulse 75   Ht 5\' 2"  (1.575 m)   Wt 147 lb 9.6 oz (67 kg)   SpO2 94%   BMI 27.00 kg/m    Wt Readings from Last 3 Encounters:  12/30/22 147 lb 9.6 oz (67 kg)  11/11/22 147 lb (66.7 kg)  08/21/22 149 lb (67.6 kg)    GEN: Well nourished, well developed in no acute distress NECK: No JVD; No carotid bruits CARDIAC: Irregular RR, no murmurs, rubs, gallops RESPIRATORY:  Clear to auscultation without rales, wheezing or rhonchi  ABDOMEN: Soft, non-tender, non-distended EXTREMITIES:  No edema; No deformity     ASSESSMENT AND PLAN: .    Carotid/subclavian artery disease: Stable findings on carotid duplex 10/23/22, plan to repeat in 1 year. Order is already placed. No acute concerns today.   Permanent atrial fibrillation on chronic anticoagulation: HR is well controlled.  Continue metoprolol for rate control. No bleeding concerns.  Continue Eliquis 5 mg twice daily which is appropriate dose (age/wt/creatinine) for stroke prevention for CHA2DS2-VASc score of 4.   PAD: She reports decreased claudication and increased leg strength since increasing her activity, particularly walking.  PAD has been followed by Dr. Kirke Corin.  She will continue to see Dr. Kirke Corin for both PAD and general cardiology.  Hypertension: BP is well controlled. No medication changes today.   Hyperlipidemia: LDL 62 on 11/11/22.  She remains physically active and eats a heart healthy diet. Continue atorvastatin.        Dispo: 6 months with Dr. Kirke Corin for primary cardiology and management of PV  Signed, Eligha Bridegroom, NP-C

## 2023-02-28 ENCOUNTER — Other Ambulatory Visit: Payer: Self-pay | Admitting: Cardiovascular Disease

## 2023-03-10 ENCOUNTER — Other Ambulatory Visit: Payer: Self-pay | Admitting: Cardiovascular Disease

## 2023-03-11 NOTE — Telephone Encounter (Signed)
Prescription refill request for Eliquis received. Indication:afib Last office visit:8/24 Scr:1.07  10/24 Age:82  Weight:67  kg  Prescription refilled

## 2023-03-19 ENCOUNTER — Telehealth: Payer: Self-pay

## 2023-03-19 NOTE — Transitions of Care (Post Inpatient/ED Visit) (Signed)
   03/19/2023  Name: [name] MRN: [id number] DOB: 03-02-1941  Today's TOC FU Call Status: Today's TOC FU Call Status:: Unsuccessful Call (1st Attempt) Unsuccessful Call (1st Attempt) Date: 03/19/23  Attempted to reach the patient regarding the most recent Inpatient/ED visit.  Follow Up Plan: Additional outreach attempts will be made to reach the patient to complete the Transitions of Care (Post Inpatient/ED visit) call.   Deidre Ala, RN Medical illustrator [facility] [phone]

## 2023-03-20 ENCOUNTER — Telehealth: Payer: Self-pay

## 2023-03-20 NOTE — Transitions of Care (Post Inpatient/ED Visit) (Signed)
   03/20/2023  Name: [name] MRN: [id number] DOB: 01-09-1941  Today's TOC FU Call Status: Today's TOC FU Call Status:: Unsuccessful Call (2nd Attempt) Unsuccessful Call (2nd Attempt) Date: 03/20/23  Attempted to reach the patient regarding the most recent Inpatient/ED visit.  Follow Up Plan: Additional outreach attempts will be made to reach the patient to complete the Transitions of Care (Post Inpatient/ED visit) call.   Deidre Ala, RN Medical illustrator [facility] [phone]

## 2023-04-02 IMAGING — MG MM BREAST LOCALIZATION CLIP
4 series · 4 of 12 positions shown · non-contrast
Comparison: Previous exam(s).

CLINICAL DATA: Status post stereotactic biopsy today for a small
low-density mass within the outer RIGHT breast.

EXAM:
DIAGNOSTIC RIGHT MAMMOGRAM POST STEREOTACTIC BIOPSY

[R ML synth-2D]
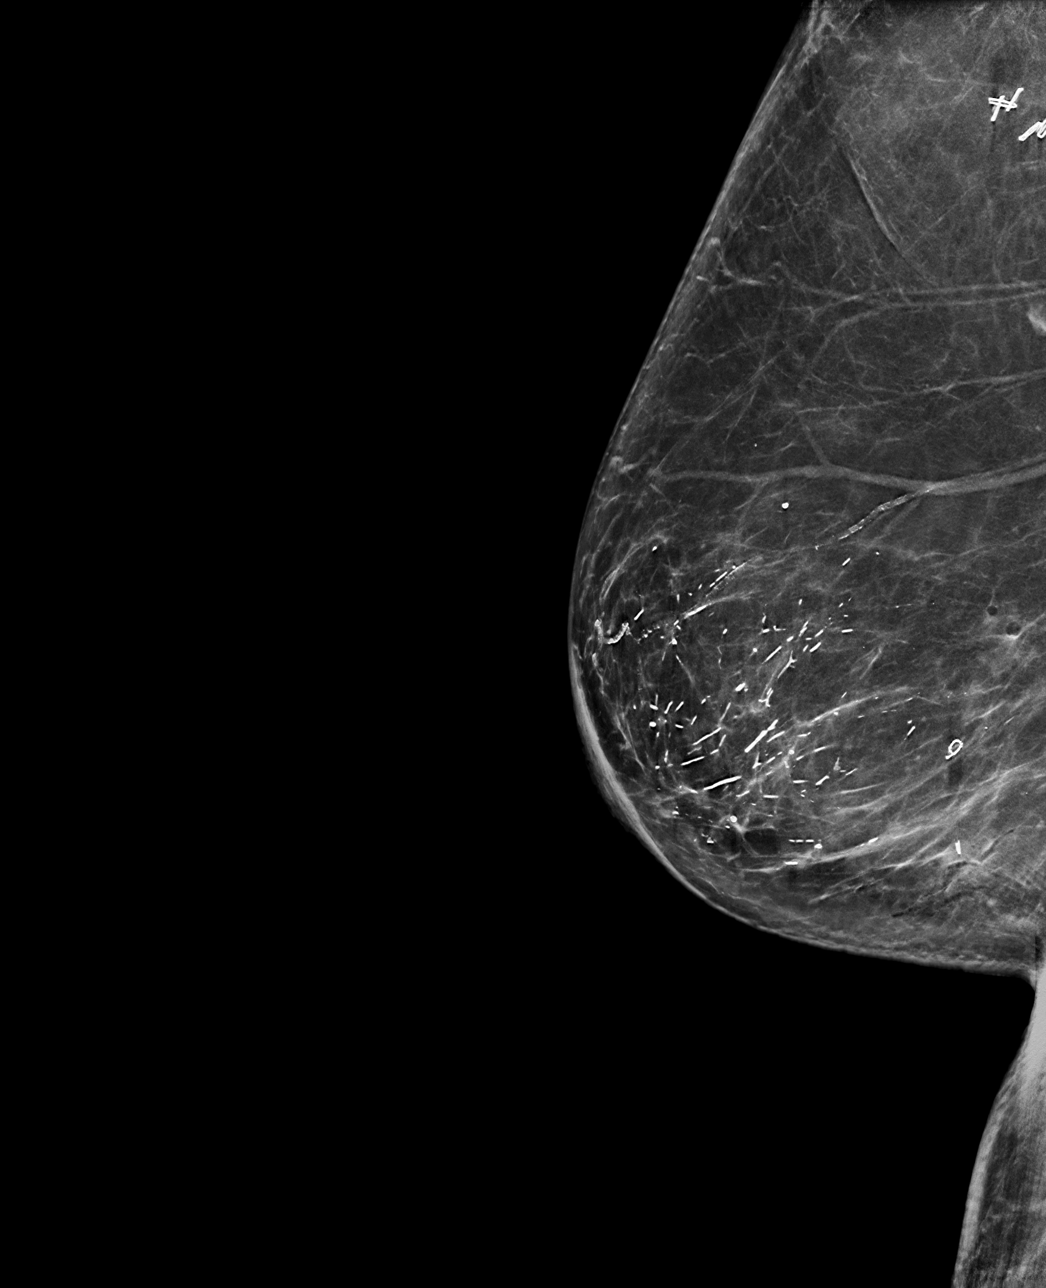

[R CC synth-2D]
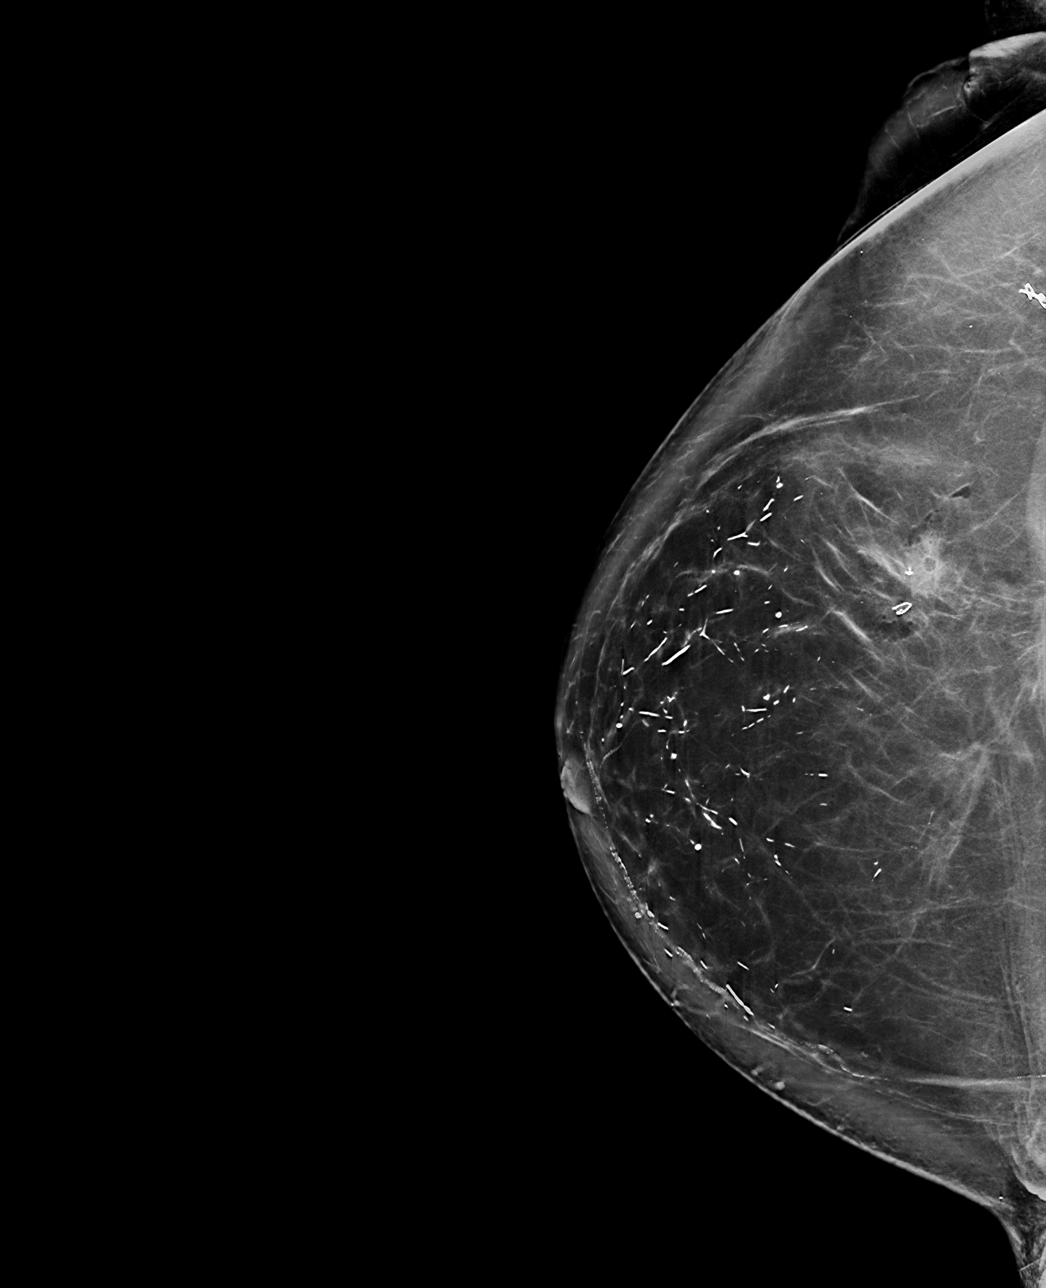

[R CC tomo · tomo slice 51/100.0]
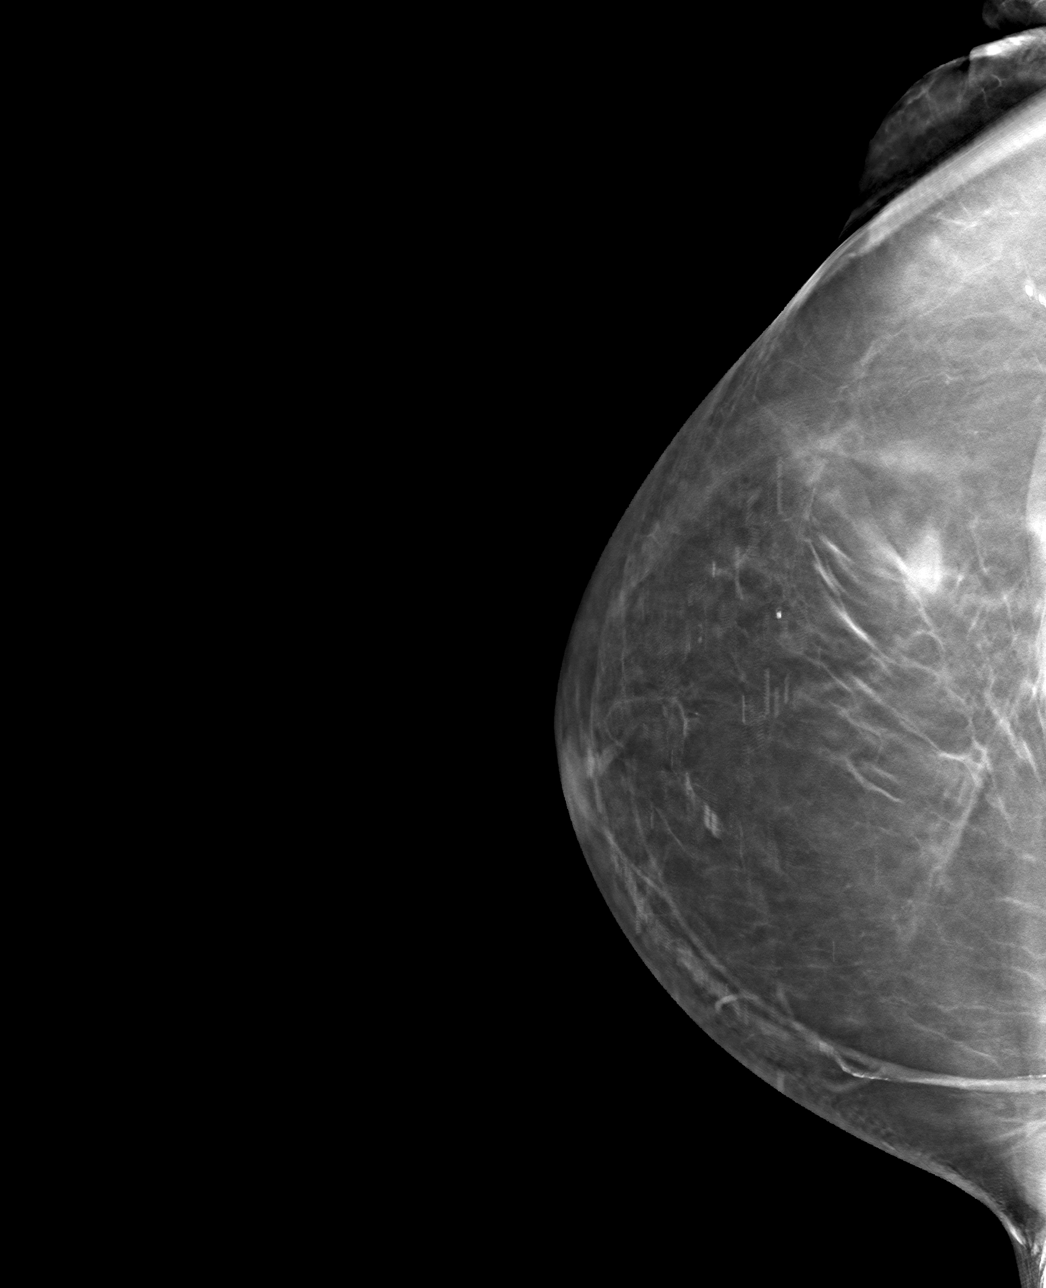

[R ML tomo · tomo slice 45/88.0]
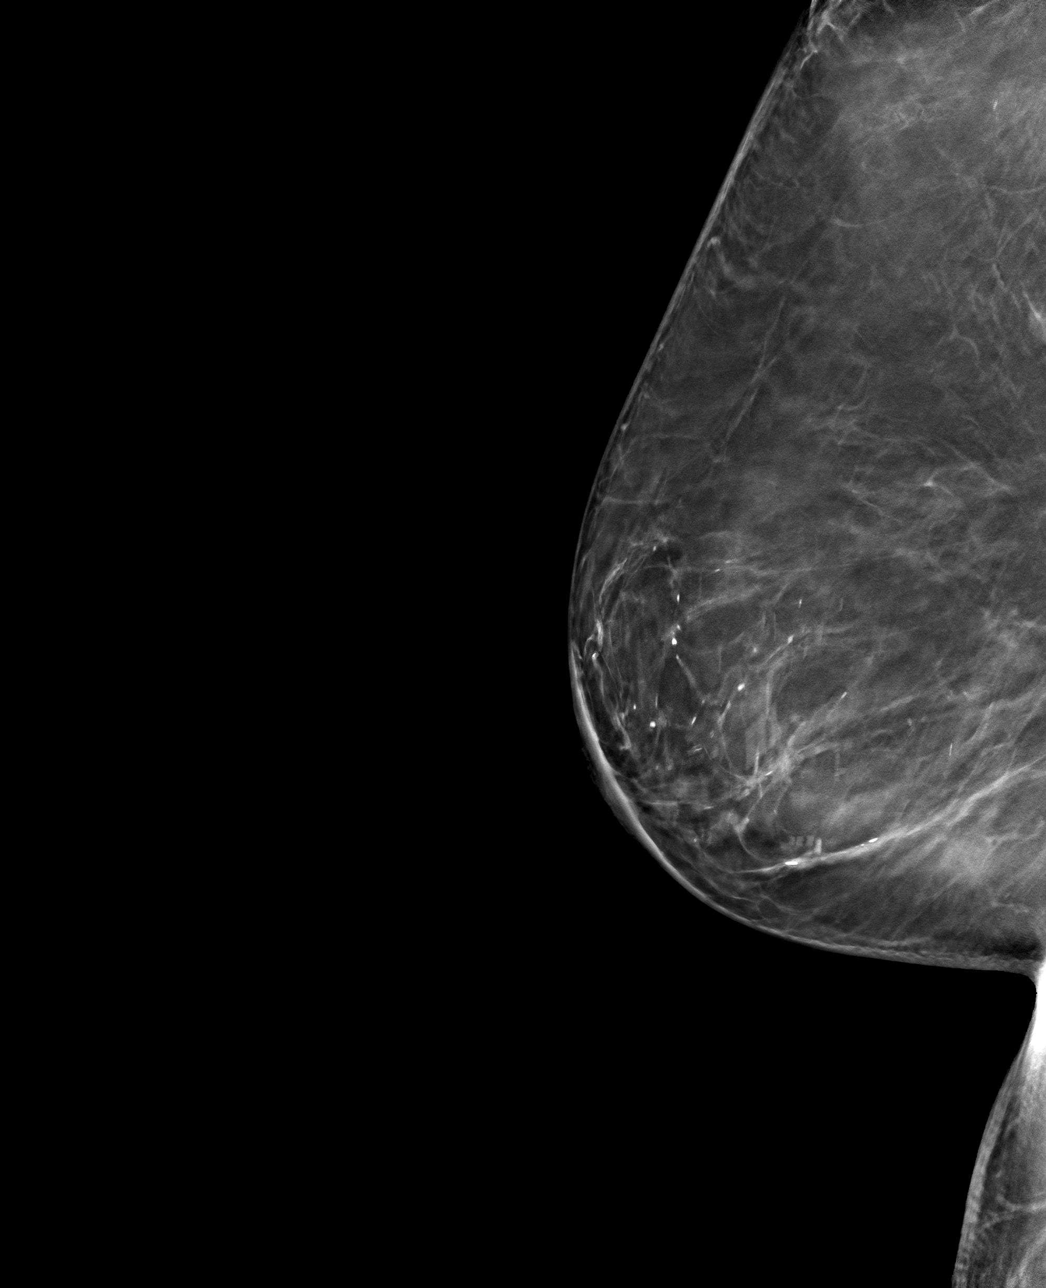

[4 of 12 positions shown; findings below may reference images not displayed]

FINDINGS: Mammographic images were obtained following stereotactic guided
biopsy of the small low-density mass within the outer RIGHT breast.
The biopsy marking clip is in expected position at the site of
biopsy.
IMPRESSION: Appropriate positioning of the ribbon shaped biopsy marking clip at
the site of biopsy in the lower outer quadrant of the RIGHT breast.

Final Assessment: Post Procedure Mammograms for Marker Placement

## 2023-05-02 ENCOUNTER — Other Ambulatory Visit: Payer: Self-pay | Admitting: Cardiovascular Disease

## 2023-05-04 ENCOUNTER — Other Ambulatory Visit: Payer: Self-pay | Admitting: Interventional Cardiology

## 2023-05-19 ENCOUNTER — Ambulatory Visit: Payer: Medicare Other | Admitting: Nurse Practitioner

## 2023-05-27 ENCOUNTER — Ambulatory Visit: Payer: Medicare Other | Admitting: Nurse Practitioner

## 2023-06-24 ENCOUNTER — Other Ambulatory Visit: Payer: Self-pay | Admitting: Cardiovascular Disease

## 2023-06-25 ENCOUNTER — Other Ambulatory Visit: Payer: Self-pay | Admitting: Cardiovascular Disease

## 2023-06-25 NOTE — Telephone Encounter (Signed)
Last visit: 04/11/22 with follow up plan 12 months  next visit: 07/04/23

## 2023-07-07 ENCOUNTER — Ambulatory Visit: Payer: Medicare Other | Attending: Cardiovascular Disease | Admitting: Cardiovascular Disease

## 2023-07-07 NOTE — Progress Notes (Deleted)
 Cardiology Office Note   Date:  07/07/2023   ID:  [name], [name] December 24, 1940, MRN [id number]  PCP:  Bethanie Dicker, NP  Cardiologist: Dr. Eldridge Dace  No chief complaint on file.      History of Present Illness: [name] is a 83 y.o. female who is here today for follow-up visit regarding peripheral arterial disease.   She has known history of atrial fibrillation, hyperlipidemia, hypertension and carotid disease.  She is not a smoker and has no family history of coronary artery disease.  She is not diabetic. She is status post bilateral carotid artery stenting in 2014. She was seen in 2019 for bilateral calf claudication as well as arm discomfort with exertion. She had carotid Doppler done in November, 2019 which showed less than 50% stenosis bilaterally.  There was antegrade flow in both vertebral arteries and no significant disease in the subclavian arteries. She had noninvasive vascular evaluation done which showed an ABI of 0.88 on the right and 0.91 on the left but difficult to interpret this given that pressure in the arms was abnormal.   Duplex showed significant right common femoral artery disease with moderate SFA disease and occluded popliteal artery.  On the left, there was severe disease affecting the left distal SFA.   Duplex of the upper extremities showed severe subclavian disease bilaterally worse on the left side.   Aortic arch and lower extremity angiography was performed in February 2020. There was moderate right subclavian artery stenosis at the origin of the RIMA.  There was significant stenosis affecting the left proximal axillary artery.  In the right lower extremity, there was significant calcified common femoral artery disease extending into the ostium of the SFA with occluded popliteal artery and two-vessel runoff below the knee.  On the left, there was borderline significant common iliac artery stenosis with diffuse moderate disease affecting the entire  left SFA with severe focal stenosis distally and three-vessel runoff below the knee.  Given that the patient's symptoms were not lifestyle limiting, I advised her to start an exercise program before considering revascularization.   She has been doing reasonably well overall.  She reports slight worsening of bilateral leg claudication but overall she is able to continue with activities of daily living and some form of exercise.  She tries to play pickle ball when records are available.  Sometimes she has to stop and rest for few minutes but then when she resumes she does not get pain anymore.  Past Medical History:  Diagnosis Date   Arthritis    hands, knees, ankles   BCC (basal cell carcinoma of skin) 2016   Stinehelfer   Bilateral carotid artery stenosis    a. s/p right carotid stent after restenosis post CEA, left carotid stent (previously on Plavix for these). b. Carotid US 03/2016: stable 40-59% BICA.   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 dx 12/2015 at routine physical.    Past Surgical History:  Procedure Laterality Date   ABDOMINAL AORTOGRAM W/LOWER EXTREMITY N/A 06/23/2018   Procedure: ABDOMINAL AORTOGRAM W/LOWER EXTREMITY;  Surgeon: Iran Ouch, MD;  Location: [facility] INVASIVE CV LAB;  Service: Cardiovascular;  Laterality: N/A;   ABI  2007   R 1.19, L 1.2   AORTIC ARCH ANGIOGRAPHY N/A 06/23/2018   Procedure: AORTIC ARCH ANGIOGRAPHY;  Surgeon:  Iran Ouch, MD;  Location: [facility] INVASIVE CV LAB;  Service: Cardiovascular;  Laterality: N/A;   APPENDECTOMY  1995   BREAST BIOPSY Left 1997   left   BREAST BIOPSY Right 08/22/2020   [place] Bx, Q-clip, neg/ discordant   BREAST BIOPSY Right 09/05/2020   stereo biopsy/ ribbon clip/ path pending   BREAST CYST ASPIRATION Left 02/1997   fna   BREAST EXCISIONAL BIOPSY Right 1998   benign per pt   CARDIOVASCULAR STRESS TEST  ~2003   WNL   CAROTID ENDARTERECTOMY  2002   bilateral   CAROTID STENT Right 2002   right after restenosis post CEA (Dr. Jodie Echevaria)   CAROTID STENT  2014   for stenosis   CAROTID STENT Left 2014   Dr. Burnetta Sabin, [facility]   CESAREAN SECTION  1968; 1972   COLONOSCOPY  03/2012   Evette Cristal - polyp (rec rpt 5 yrs)   EXPLORATORY LAPAROTOMY  1990s   for GI pain, inflamed lower GI, unrevealing workup, improved with abx   HERNIA REPAIR  2005   abdomen   MELANOMA EXCISION  1998   right shoulder   TOTAL HIP ARTHROPLASTY  2005   bilat     Current Outpatient Medications  Medication Sig Dispense Refill   Acetaminophen (TYLENOL ARTHRITIS PAIN PO) Take 1 tablet by mouth daily.     acetaminophen (TYLENOL) 500 MG tablet Take 500 mg by mouth daily.      atorvastatin (LIPITOR) 40 MG tablet TAKE 1 TABLET BY MOUTH AT  BEDTIME 30 tablet 0   Cholecalciferol (VITAMIN D-3) 25 MCG (1000 UT) CAPS Take by mouth.     diltiazem (CARDIZEM CD) 120 MG 24 hr capsule TAKE 1 CAPSULE BY MOUTH DAILY 100 capsule 2   ELIQUIS 5 MG TABS tablet TAKE 1 TABLET BY MOUTH TWICE  DAILY 200 tablet 2   enalapril (VASOTEC) 5 MG tablet TAKE 1 TABLET BY MOUTH DAILY 30 tablet 0   fluorouracil (EFUDEX) 5 % cream as needed (skin cancer).     metoprolol succinate (TOPROL-XL) 100 MG 24 hr tablet TAKE 1 TABLET BY MOUTH DAILY  WITH OR IMMEDIATELY FOLLOWING A  MEAL 90 tablet 0   Multiple Vitamin (MULTIVITAMIN WITH MINERALS) TABS tablet Take 1 tablet by mouth daily.     Omega-3 Fatty Acids (FISH OIL) 1200 MG CAPS Take 1,200 mg by  mouth daily.     triamcinolone cream (KENALOG) 0.1 % Apply 1 application topically daily as needed (skin cancer).     No current facility-administered medications for this visit.    Allergies:   Patient has no known allergies.    Social History:  The patient  reports that she has quit smoking. Her smoking use included cigarettes. She has a 30 pack-year smoking history. She has never used smokeless tobacco. She reports current alcohol use. She reports that she does not use drugs.   Family History:  The patient's family history includes Cancer in her brother; Cancer (age of onset: 18) in her father; Cancer (age  of onset: 31) in her mother; Hypertension in her paternal grandfather.    ROS:  Please see the history of present illness.   Otherwise, review of systems are positive for none.   All other systems are reviewed and negative.    PHYSICAL EXAM: VS:  There were no vitals taken for this visit. , BMI There is no height or weight on file to calculate BMI. GEN: Well nourished, well developed, in no acute distress  HEENT: normal  Neck: no JVD, carotid bruits, or masses Cardiac: Irregularly irregular; no murmurs, rubs, or gallops, trace edema  Respiratory:  clear to auscultation bilaterally, normal work of breathing GI: soft, nontender, nondistended, + BS MS: no deformity or atrophy  Skin: warm and dry, no rash Neuro:  Strength and sensation are intact Psych: euthymic mood, full affect Vascular: Radial pulses +1 bilaterally.  Femoral pulses mildly diminished bilaterally.  Distal pulses are not palpable.   EKG:  EKG is ordered today. EKG showed atrial fibrillation with ventricular rate of 81 bpm.   Recent Labs: 11/11/2022: ALT 40; BUN 18; Creatinine, Ser 0.97; Hemoglobin 15.1; Platelets 169.0; Potassium 4.2; Sodium 135    Lipid Panel    Component Value Date/Time   CHOL 116 11/11/2022 0813   CHOL 126 11/18/2021 1054   TRIG 69.0 11/11/2022 0813   TRIG 144 03/28/2011 0000   HDL  39.60 11/11/2022 0813   HDL 38 (L) 11/18/2021 1054   CHOLHDL 3 11/11/2022 0813   VLDL 13.8 11/11/2022 0813   LDLCALC 62 11/11/2022 0813   LDLCALC 68 11/18/2021 1054   LDLCALC 76 02/16/2018 0924   LDLDIRECT 78 03/28/2011 0000      Wt Readings from Last 3 Encounters:  12/30/22 147 lb 9.6 oz (67 kg)  11/11/22 147 lb (66.7 kg)  08/21/22 149 lb (67.6 kg)           No data to display            ASSESSMENT AND PLAN:  1.  Peripheral arterial disease: She reports slight worsening of bilateral calf claudication worse on the left than the right but she is still able to do activities of daily living.  I requested lower extremity arterial Doppler to ensure stability. In terms of revascularization options, the disease on the right lower extremity requires right femoral endarterectomy into the ostium of the SFA or atherectomy as a second choice.  On the left side, the whole SFA will need to be treated.  2. 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 she reports mild arm claudication that is currently not lifestyle limiting.   Continue medical therapy.  3.  Carotid disease: Status post bilateral carotid artery stenting in 2014.  I reviewed most recent carotid Doppler in June of this year showed no significant restenosis.  4.  Atrial fibrillation: Ventricular rate is controlled and she is tolerating anticoagulation.  5.  Hyperlipidemia: Continue treatment with atorvastatin with a target LDL of less than 70.  Most recent lipid profile showed an LDL of 81.   Disposition:   FU with me in 12 months  Signed,  Lorine Bears, MD  07/07/2023 1:33 PM    [facility]

## 2023-07-20 ENCOUNTER — Other Ambulatory Visit: Payer: Self-pay | Admitting: Cardiology

## 2023-07-20 NOTE — Telephone Encounter (Signed)
 Good Morning,   Could schedule this patient an overdue follow up appointment? Patient was last seen by S. Hammock 04-11-2022. The patient was a patient of Dr. Eldridge Dace. I seen on the recall note that she wanted Dr. Kirke Corin to become her primary cardiologist, but I am not for sure if she is seeing a cardiologist at [facility]

## 2023-07-21 NOTE — Telephone Encounter (Signed)
LVM to schedule appt, please schedule 

## 2023-07-23 NOTE — Telephone Encounter (Signed)
LVM to schedule fu appt, please schedule 

## 2023-08-04 ENCOUNTER — Other Ambulatory Visit: Payer: Self-pay | Admitting: Cardiovascular Disease

## 2023-08-04 NOTE — Telephone Encounter (Signed)
LVM to schedule fu appt, please schedule 

## 2023-08-04 NOTE — Telephone Encounter (Signed)
 Please schedule appointment for future refills

## 2023-08-06 NOTE — Telephone Encounter (Signed)
 LVM to schedule f/u appt

## 2023-08-18 ENCOUNTER — Telehealth: Payer: Self-pay

## 2023-08-18 NOTE — Transitions of Care (Post Inpatient/ED Visit) (Signed)
   08/18/2023  Name: [name] MRN: [id number] DOB: October 27, 1940  Today's TOC FU Call Status: Today's TOC FU Call Status:: Unsuccessful Call (1st Attempt) Unsuccessful Call (1st Attempt) Date: 08/18/23  Attempted to reach the patient regarding the most recent Inpatient/ED visit.  Follow Up Plan: Additional outreach attempts will be made to reach the patient to complete the Transitions of Care (Post Inpatient/ED visit) call.   Signature Karena Addison, LPN [facility] Nurse Health Advisor Direct Dial [phone]

## 2023-08-19 NOTE — Transitions of Care (Post Inpatient/ED Visit) (Signed)
   08/19/2023  Name: [name] MRN: [id number] DOB: 09/12/1940  Today's TOC FU Call Status: Today's TOC FU Call Status:: Unsuccessful Call (2nd Attempt) Unsuccessful Call (1st Attempt) Date: 08/18/23 Unsuccessful Call (2nd Attempt) Date: 08/19/23  Attempted to reach the patient regarding the most recent Inpatient/ED visit.  Follow Up Plan: Additional outreach attempts will be made to reach the patient to complete the Transitions of Care (Post Inpatient/ED visit) call.   Signature Karena Addison, LPN [facility] Nurse Health Advisor Direct Dial [phone]

## 2023-08-24 NOTE — Transitions of Care (Post Inpatient/ED Visit) (Signed)
   08/24/2023  Name: [name] MRN: [id number] DOB: Apr 27, 1941  Today's TOC FU Call Status: Today's TOC FU Call Status:: Unsuccessful Call (3rd Attempt) Unsuccessful Call (1st Attempt) Date: 08/18/23 Unsuccessful Call (2nd Attempt) Date: 08/19/23 Unsuccessful Call (3rd Attempt) Date: 08/24/23  Attempted to reach the patient regarding the most recent Inpatient/ED visit.  Follow Up Plan: No further outreach attempts will be made at this time. We have been unable to contact the patient.  Signature Darrall Ellison, LPN [facility] Nurse Health Advisor Direct Dial [phone]

## 2023-09-04 ENCOUNTER — Ambulatory Visit: Payer: Medicare Other

## 2023-11-24 ENCOUNTER — Ambulatory Visit
